# Patient Record
Sex: Female | Born: 1955 | Race: Black or African American | Hispanic: No | Marital: Single | Smoking: Former smoker
Health system: Southern US, Community
[De-identification: ages and names within clinical notes are randomized; demographics above are authoritative.]

## PROBLEM LIST (undated history)

## (undated) DIAGNOSIS — E559 Vitamin D deficiency, unspecified: Secondary | ICD-10-CM

## (undated) DIAGNOSIS — M1990 Unspecified osteoarthritis, unspecified site: Secondary | ICD-10-CM

## (undated) DIAGNOSIS — E785 Hyperlipidemia, unspecified: Secondary | ICD-10-CM

## (undated) HISTORY — PX: ABDOMINAL HYSTERECTOMY: SHX81

## (undated) HISTORY — DX: Hyperlipidemia, unspecified: E78.5

## (undated) HISTORY — DX: Unspecified osteoarthritis, unspecified site: M19.90

## (undated) HISTORY — DX: Vitamin D deficiency, unspecified: E55.9

## (undated) HISTORY — PX: COLONOSCOPY: SHX174

---

## 1992-11-23 HISTORY — PX: TUBAL LIGATION: SHX77

## 2001-05-24 ENCOUNTER — Other Ambulatory Visit: Admission: RE | Admit: 2001-05-24 | Discharge: 2001-05-24 | Payer: Self-pay | Admitting: Family Medicine

## 2003-05-24 ENCOUNTER — Other Ambulatory Visit: Admission: RE | Admit: 2003-05-24 | Discharge: 2003-05-24 | Payer: Self-pay | Admitting: Family Medicine

## 2004-12-19 ENCOUNTER — Ambulatory Visit (HOSPITAL_COMMUNITY): Admission: RE | Admit: 2004-12-19 | Discharge: 2004-12-19 | Payer: Self-pay | Admitting: Family Medicine

## 2006-04-22 ENCOUNTER — Encounter: Admission: RE | Admit: 2006-04-22 | Discharge: 2006-04-22 | Payer: Self-pay | Admitting: Family Medicine

## 2007-03-21 ENCOUNTER — Other Ambulatory Visit: Admission: RE | Admit: 2007-03-21 | Discharge: 2007-03-21 | Payer: Self-pay | Admitting: Internal Medicine

## 2007-08-11 ENCOUNTER — Encounter: Admission: RE | Admit: 2007-08-11 | Discharge: 2007-08-11 | Payer: Self-pay | Admitting: Family Medicine

## 2007-11-03 ENCOUNTER — Ambulatory Visit (HOSPITAL_BASED_OUTPATIENT_CLINIC_OR_DEPARTMENT_OTHER): Admission: RE | Admit: 2007-11-03 | Discharge: 2007-11-03 | Payer: Self-pay | Admitting: Orthopedic Surgery

## 2009-07-25 ENCOUNTER — Other Ambulatory Visit: Admission: RE | Admit: 2009-07-25 | Discharge: 2009-07-25 | Payer: Self-pay | Admitting: Family Medicine

## 2009-08-13 ENCOUNTER — Encounter: Admission: RE | Admit: 2009-08-13 | Discharge: 2009-08-13 | Payer: Self-pay | Admitting: Family Medicine

## 2010-08-15 ENCOUNTER — Other Ambulatory Visit: Admission: RE | Admit: 2010-08-15 | Discharge: 2010-08-15 | Payer: Self-pay | Admitting: Family Medicine

## 2010-09-09 ENCOUNTER — Encounter: Admission: RE | Admit: 2010-09-09 | Discharge: 2010-09-09 | Payer: Self-pay | Admitting: Family Medicine

## 2010-12-14 ENCOUNTER — Encounter: Payer: Self-pay | Admitting: Family Medicine

## 2011-04-07 NOTE — Op Note (Signed)
NAME:  Molly Hanson, Molly Hanson               ACCOUNT NO.:  [id number]   MEDICAL RECORD NO.:  [id number]          PATIENT TYPE:  AMB   LOCATION:  [facility]                         FACILITY:  [facility]   PHYSICIAN:  Deidre Ala, M.D.    DATE OF BIRTH:  [birth year]   DATE OF PROCEDURE:  11/03/2007  DATE OF DISCHARGE:                               OPERATIVE REPORT   PREOPERATIVE DIAGNOSIS:  1. Left carpal tunnel syndrome.  2. Left elbow, medial elbow epicondylitis.  3. Left elbow, lateral elbow epicondylitis.   POSTOPERATIVE DIAGNOSIS:  1. Left carpal tunnel syndrome.  2. Left elbow, medial elbow epicondylitis.  3. Left elbow, lateral elbow epicondylitis.   PROCEDURE:  1. Left carpal tunnel release.  2. Injection cortisone Marcaine mixture medial elbow epicondyle left.  3. Injection cortisone Marcaine mixture lateral elbow epicondyle left.   SURGEON:  1. Charlesetta Shanks, M.D.   ASSISTANT:  Phineas Semen, P.A.-C   ANESTHESIA:  General with LMA.   CULTURES:  None.   DRAINS:  None.   ESTIMATED BLOOD LOSS:  Minimal.   TOURNIQUET TIME:  20 minutes.   PATHOLOGIC FINDINGS AND HISTORY:  Molly Hanson is a 55 year old female who  presented to me through the courtesy of Tally Joe, M.D. of the [facility].  She had had chronic elbow pain as well as  pain upper arm.  She does a lot of typing.  The patient had had recent  positive nerve conduction studies at [facility] on October 10, 2007 for mild left carpal tunnel.  On exam she had consistent exam with  medial and lateral elbow epicondylitis, and a carpal tunnel positivity  with positive Phalen's test, etc.  She did not desire cortisone  injections or splinting on the hand and wrist.  She wanted to proceed  with surgical intervention, and elected to proceed with injections to  calm down the epicondylitis from her typing component, but wanted to  have them done in the operating room.   We injected 2 mL of 40 mg/mL and 3 mL  0.5% Marcaine plain in both the  medial and lateral elbow epicondyle, before the patient was fully  prepped with a Betadine swab over the medial and lateral elbow  epicondyles.  We then did a left carpal tunnel release with classic  technique with classic findings noted.   DESCRIPTION OF PROCEDURE:  With adequate anesthesia obtained using LMA  technique, 1 gram Ancef given IV prophylaxis, the patient was placed in  the supine position.  The left upper extremity was prepped from the  fingertips to the tourniquet in the standard fashion.  After standard  prepping and draping both the medial and lateral elbow epicondyles were  injected as above.  We then prepped and draped distally, Esmarch  exsanguination was used, and the tourniquet was let up to 250 mmHg.  An  incision was then made in the longitudinal flexion crease at the base of  the palm and thumb.  Incision was deepened sharply with the knife and  hemostasis obtained using the Bovie electrocoagulated.  Under loupe  magnification, dissection was  carried down to the palmar fascia and  retractors were placed.   I then placed a Therapist, nutritional underneath the palmar fascia and  transverse carpal ligament, protecting the nerve.  I then cut down upon  it with a 64 Beaver blade exposing the nerve.  The nerve was then  carefully neurolysed with scissors, and a volar aponeurotomy carried  out.  I then released the distal wrist retinaculum on the ulnar side of  the forearm well up underneath the skin, up into the distal forearm.  Part of the radial retinaculum was then excised.  Bleeding points were  cauterized, irrigation was carried out.  The wound was then closed with  interrupted and vertical mattress sutures of running 4-0 nylon.  A bulky  sterile compressive dressing was applied with volar plaster splint in  slight cock-up.   The patient, having tolerated the procedure well, was awakened, taken to  recovery room in satisfactory  condition, to be discharged per outpatient  routine. given Percocet for pain; and told to call the office for  recheck on Monday.           ______________________________  V. Charlesetta Shanks, M.D.     VEP/MEDQ  D:  11/03/2007  T:  11/04/2007  Job:  [id number]   cc:   Tally Joe, M.D.  Fax: [phone]

## 2011-08-31 LAB — I-STAT 8, (EC8 V) (CONVERTED LAB)
Acid-Base Excess: 1
Chloride: 107
Glucose, Bld: 92
TCO2: 26
pCO2, Ven: 37.4 — ABNORMAL LOW
pH, Ven: 7.439 — ABNORMAL HIGH

## 2011-08-31 LAB — POCT PREGNANCY, URINE
Operator id: 268271
Preg Test, Ur: NEGATIVE

## 2016-06-02 DIAGNOSIS — M6701 Short Achilles tendon (acquired), right ankle: Secondary | ICD-10-CM | POA: Insufficient documentation

## 2016-06-02 DIAGNOSIS — M7751 Other enthesopathy of right foot: Secondary | ICD-10-CM | POA: Insufficient documentation

## 2016-06-02 DIAGNOSIS — M7661 Achilles tendinitis, right leg: Secondary | ICD-10-CM | POA: Insufficient documentation

## 2020-07-23 ENCOUNTER — Other Ambulatory Visit: Payer: Self-pay | Admitting: Family

## 2020-07-23 DIAGNOSIS — Z1231 Encounter for screening mammogram for malignant neoplasm of breast: Secondary | ICD-10-CM

## 2020-09-10 ENCOUNTER — Other Ambulatory Visit: Payer: Self-pay

## 2020-09-10 ENCOUNTER — Ambulatory Visit
Admission: RE | Admit: 2020-09-10 | Discharge: 2020-09-10 | Disposition: A | Discharge status: Home / Self Care | Payer: Self-pay | Source: Ambulatory Visit | Attending: Family | Admitting: Family

## 2020-09-10 DIAGNOSIS — Z1231 Encounter for screening mammogram for malignant neoplasm of breast: Secondary | ICD-10-CM

## 2020-09-18 ENCOUNTER — Other Ambulatory Visit: Payer: Self-pay | Admitting: Specialist

## 2020-09-18 ENCOUNTER — Other Ambulatory Visit: Payer: Self-pay | Admitting: Urgent Care

## 2020-09-18 DIAGNOSIS — R1031 Right lower quadrant pain: Secondary | ICD-10-CM

## 2020-09-30 ENCOUNTER — Encounter: Payer: Self-pay | Admitting: Internal Medicine

## 2020-10-01 NOTE — Patient Instructions (Signed)
Thank you for choosing Primary Care at [facility] to be your medical home!    Dallas Schimke was seen by Melina Schools, DO today.   Gaynelle Arabian Rupert's primary care provider is Phill Myron, DO.   For the best care possible, you should try to see Phill Myron, DO whenever you come to the clinic.   We look forward to seeing you again soon!  If you have any questions about your visit today, please call us at [phone] or feel free to reach your primary care provider via [place].

## 2020-10-02 ENCOUNTER — Encounter: Payer: Self-pay | Admitting: Internal Medicine

## 2020-10-02 ENCOUNTER — Telehealth (INDEPENDENT_AMBULATORY_CARE_PROVIDER_SITE_OTHER): Payer: 59 | Admitting: Internal Medicine

## 2020-10-02 DIAGNOSIS — Z1211 Encounter for screening for malignant neoplasm of colon: Secondary | ICD-10-CM | POA: Diagnosis not present

## 2020-10-02 DIAGNOSIS — R200 Anesthesia of skin: Secondary | ICD-10-CM | POA: Diagnosis not present

## 2020-10-02 DIAGNOSIS — Z7689 Persons encountering health services in other specified circumstances: Secondary | ICD-10-CM

## 2020-10-02 NOTE — Progress Notes (Addendum)
Virtual Visit via Telephone Note  I connected with Molly Hanson, on 10/02/2020 at 8:56 AM by telephone due to the COVID-19 pandemic and verified that I am speaking with the correct person using two identifiers.   Consent: I discussed the limitations, risks, security and privacy concerns of performing an evaluation and management service by telephone and the availability of in person appointments. I also discussed with the patient that there may be a patient responsible charge related to this service. The patient expressed understanding and agreed to proceed.   Location of Patient: Home   Location of Provider: Clinic    Persons participating in Telemedicine visit: Menaal Russum [facility] Dr. Juleen China    History of Present Illness: Patient has a visit to establish care. Reports she used to have HTN but this has resolved. History of BTL 28 years ago. Family history of colon polyps. Reports her last colonoscopy was >10 years and was normal.   Reports that she is having is left sided shoulder and knee pain. Also has left sided numbness that goes from her face down to her toes. No facial droop. No change in speech pattern. No weakness on the left side. Able to move all extremities equally. This all started about 2 weeks ago. The pain comes and goes but the numbness stays present. Reports numbness feels like a weird sensation when she rubs her skin. Doesn't remember exactly what was going on when this started.    No past medical history on file. No Known Allergies  Current Outpatient Medications on File Prior to Visit  Medication Sig Dispense Refill  . Vitamin D, Ergocalciferol, (DRISDOL) 1.25 MG (50000 UNIT) CAPS capsule Take 50,000 Units by mouth once a week.     No current facility-administered medications on file prior to visit.    Observations/Objective: NAD. Speaking clearly.  Work of breathing normal.  Alert and oriented. Mood appropriate.   Assessment and  Plan: 1. Encounter to establish care Reviewed patient's PMH, social history, surgical history, and medications.  Is overdue for annual exam, screening blood work, and health maintenance topics. Have asked patient to return for visit to address these items.   2. Screening for colon cancer - Ambulatory referral to Gastroenterology  3. Left sided numbness Differential remains broad. Given persistent one sided numbness, concerned from CVA or mass. Given symptoms have been persistent for >2 weeks, she is outside of window for acute intervention if were to be CVA and did not recommend emergent medical attention. Will obtain MRI brain. Will also obtain labs to rule out other etiologies of neuropathies. If work up is unremarkable, would likely refer to neuro for work up and consideration of utility of EMG studies. Considered that given her report of shoulder and knee pain that could be nerve compression; however per patient report her numbness is diffuse and involves the face making this less likely.  - MR Brain W Wo Contrast; Future - TSH; Future - Comprehensive metabolic panel; Future - CBC; Future - Vitamin B12; Future - Folate; Future - RPR; Future - HIV Antibody (routine testing w rflx); Future - Hemoglobin A1c; Future   Follow Up Instructions: Lab visit 11/12    I discussed the assessment and treatment plan with the patient. The patient was provided an opportunity to ask questions and all were answered. The patient agreed with the plan and demonstrated an understanding of the instructions.   The patient was advised to call back or seek an in-person evaluation if the symptoms  worsen or if the condition fails to improve as anticipated.     I provided 24 minutes total of non-face-to-face time during this encounter including median intraservice time, reviewing previous notes, investigations, ordering medications, medical decision making, coordinating care and patient verbalized understanding  at the end of the visit.    Phill Myron, D.O. Primary Care at [facility]  10/02/2020, 8:56 AM

## 2020-10-03 ENCOUNTER — Inpatient Hospital Stay: Admission: RE | Admit: 2020-10-03 | Payer: 59 | Source: Ambulatory Visit

## 2020-10-04 ENCOUNTER — Telehealth: Payer: Self-pay | Admitting: Internal Medicine

## 2020-10-04 ENCOUNTER — Other Ambulatory Visit: Payer: Self-pay

## 2020-10-04 ENCOUNTER — Other Ambulatory Visit (INDEPENDENT_AMBULATORY_CARE_PROVIDER_SITE_OTHER): Payer: 59

## 2020-10-04 DIAGNOSIS — R200 Anesthesia of skin: Secondary | ICD-10-CM

## 2020-10-04 NOTE — Telephone Encounter (Signed)
We do not do prior authorizations for orders placed by a different physician. She will need to contact the ordering physicians office to have them do the PA.

## 2020-10-04 NOTE — Telephone Encounter (Signed)
Pt was seen at [facility] October 2021 & provider there ordered CT scan. Pt states now that she has established care with Juleen China as her PCP will Juleen China order a CT scan for her abdominal pain. Please advise if pt has seen PCP for abdominal pain or if pt needs appt with PCP to discuss abdominal pain.

## 2020-10-04 NOTE — Telephone Encounter (Signed)
Pt request PCP order CT abdomen with and without contrast for abdominal pain. Pt states she has the contrast in the fridge at home. Pt states was ordered by another physician outside of [facility] but no auth obtained so pt req PCP order.

## 2020-10-05 LAB — RPR: RPR Ser Ql: NONREACTIVE

## 2020-10-05 LAB — CBC
Hematocrit: 44.8 % (ref 34.0–46.6)
Hemoglobin: 15 g/dL (ref 11.1–15.9)
MCH: 29.5 pg (ref 26.6–33.0)
MCHC: 33.5 g/dL (ref 31.5–35.7)
MCV: 88 fL (ref 79–97)
Platelets: 272 10*3/uL (ref 150–450)
RBC: 5.09 x10E6/uL (ref 3.77–5.28)
RDW: 12.8 % (ref 11.7–15.4)
WBC: 8.9 10*3/uL (ref 3.4–10.8)

## 2020-10-05 LAB — COMPREHENSIVE METABOLIC PANEL
ALT: 22 IU/L (ref 0–32)
AST: 15 IU/L (ref 0–40)
Albumin/Globulin Ratio: 1.6 (ref 1.2–2.2)
Albumin: 4.2 g/dL (ref 3.8–4.8)
Alkaline Phosphatase: 78 IU/L (ref 44–121)
BUN/Creatinine Ratio: 10 — ABNORMAL LOW (ref 12–28)
BUN: 9 mg/dL (ref 8–27)
Bilirubin Total: 0.5 mg/dL (ref 0.0–1.2)
CO2: 23 mmol/L (ref 20–29)
Calcium: 9.3 mg/dL (ref 8.7–10.3)
Chloride: 106 mmol/L (ref 96–106)
Creatinine, Ser: 0.86 mg/dL (ref 0.57–1.00)
GFR calc Af Amer: 84 mL/min/{1.73_m2} (ref >59)
GFR calc non Af Amer: 73 mL/min/{1.73_m2} (ref >59)
Globulin, Total: 2.6 g/dL (ref 1.5–4.5)
Glucose: 103 mg/dL — ABNORMAL HIGH (ref 65–99)
Potassium: 4.2 mmol/L (ref 3.5–5.2)
Sodium: 141 mmol/L (ref 134–144)
Total Protein: 6.8 g/dL (ref 6.0–8.5)

## 2020-10-05 LAB — VITAMIN B12: Vitamin B-12: 689 pg/mL (ref 232–1245)

## 2020-10-05 LAB — TSH: TSH: 1.65 u[IU]/mL (ref 0.450–4.500)

## 2020-10-05 LAB — HEMOGLOBIN A1C
Est. average glucose Bld gHb Est-mCnc: 128 mg/dL
Hgb A1c MFr Bld: 6.1 % — ABNORMAL HIGH (ref 4.8–5.6)

## 2020-10-05 LAB — FOLATE: Folate: 7.8 ng/mL (ref >3.0)

## 2020-10-05 LAB — HIV ANTIBODY (ROUTINE TESTING W REFLEX): HIV Screen 4th Generation wRfx: NONREACTIVE

## 2020-10-07 ENCOUNTER — Other Ambulatory Visit: Payer: Self-pay | Admitting: Urgent Care

## 2020-10-07 DIAGNOSIS — R1031 Right lower quadrant pain: Secondary | ICD-10-CM

## 2020-10-07 NOTE — Telephone Encounter (Signed)
Called and lvm for the pt

## 2020-10-07 NOTE — Telephone Encounter (Signed)
Again, patient will need to call [facility] to have them do PA for the CT scan. I will work on the [place] for the MRI.

## 2020-10-10 ENCOUNTER — Other Ambulatory Visit: Payer: 59

## 2020-10-15 ENCOUNTER — Other Ambulatory Visit: Payer: Self-pay | Admitting: Urgent Care

## 2020-10-15 DIAGNOSIS — R1031 Right lower quadrant pain: Secondary | ICD-10-CM

## 2020-10-15 NOTE — Progress Notes (Signed)
Normal lab letter mailed.

## 2020-10-20 ENCOUNTER — Ambulatory Visit
Admission: RE | Admit: 2020-10-20 | Discharge: 2020-10-20 | Disposition: A | Discharge status: Home / Self Care | Payer: 59 | Source: Ambulatory Visit | Attending: Internal Medicine | Admitting: Internal Medicine

## 2020-10-20 ENCOUNTER — Other Ambulatory Visit: Payer: Self-pay

## 2020-10-20 DIAGNOSIS — R200 Anesthesia of skin: Secondary | ICD-10-CM

## 2020-10-20 MED ORDER — GADOBENATE DIMEGLUMINE 529 MG/ML IV SOLN
20.0000 mL | Freq: Once | INTRAVENOUS | Status: AC | PRN
  Administered 2020-10-20: 20 mL via INTRAVENOUS

## 2020-10-22 ENCOUNTER — Ambulatory Visit
Admission: RE | Admit: 2020-10-22 | Discharge: 2020-10-22 | Disposition: A | Discharge status: Home / Self Care | Payer: 59 | Source: Ambulatory Visit | Attending: Urgent Care | Admitting: Urgent Care

## 2020-10-22 DIAGNOSIS — R1031 Right lower quadrant pain: Secondary | ICD-10-CM

## 2020-10-22 MED ORDER — IOPAMIDOL (ISOVUE-300) INJECTION 61%
100.0000 mL | Freq: Once | INTRAVENOUS | Status: AC | PRN
  Administered 2020-10-22: 100 mL via INTRAVENOUS

## 2020-10-25 NOTE — Progress Notes (Signed)
Normal lab letter mailed.

## 2020-10-28 NOTE — Progress Notes (Signed)
Patient notified of results & recommendations. Expressed understanding.  Wants to know what the next steps would be.

## 2020-10-31 ENCOUNTER — Other Ambulatory Visit: Payer: Self-pay | Admitting: Internal Medicine

## 2020-10-31 DIAGNOSIS — R200 Anesthesia of skin: Secondary | ICD-10-CM

## 2020-11-01 ENCOUNTER — Encounter: Payer: Self-pay | Admitting: Neurology

## 2020-11-20 ENCOUNTER — Other Ambulatory Visit: Payer: Self-pay

## 2020-11-20 ENCOUNTER — Ambulatory Visit (AMBULATORY_SURGERY_CENTER): Payer: Self-pay | Admitting: *Deleted

## 2020-11-20 VITALS — Ht 60.5 in | Wt 253.0 lb

## 2020-11-20 DIAGNOSIS — Z1211 Encounter for screening for malignant neoplasm of colon: Secondary | ICD-10-CM

## 2020-11-20 MED ORDER — SUPREP BOWEL PREP KIT 17.5-3.13-1.6 GM/177ML PO SOLN: 1.0000 | Freq: Once | ORAL | 0 refills | Status: AC

## 2020-11-20 NOTE — Progress Notes (Signed)

## 2020-12-04 ENCOUNTER — Encounter: Payer: Self-pay | Admitting: Gastroenterology

## 2020-12-04 ENCOUNTER — Other Ambulatory Visit: Payer: Self-pay

## 2020-12-04 ENCOUNTER — Other Ambulatory Visit: Payer: Self-pay | Admitting: Gastroenterology

## 2020-12-04 ENCOUNTER — Ambulatory Visit (AMBULATORY_SURGERY_CENTER): Payer: 59 | Admitting: Gastroenterology

## 2020-12-04 VITALS — BP 114/79 | HR 75 | Temp 97.7°F | Resp 16 | Ht 65.0 in | Wt 253.0 lb

## 2020-12-04 DIAGNOSIS — D122 Benign neoplasm of ascending colon: Secondary | ICD-10-CM | POA: Diagnosis not present

## 2020-12-04 DIAGNOSIS — Z1211 Encounter for screening for malignant neoplasm of colon: Secondary | ICD-10-CM | POA: Diagnosis present

## 2020-12-04 DIAGNOSIS — D125 Benign neoplasm of sigmoid colon: Secondary | ICD-10-CM

## 2020-12-04 DIAGNOSIS — D123 Benign neoplasm of transverse colon: Secondary | ICD-10-CM

## 2020-12-04 MED ORDER — SODIUM CHLORIDE 0.9 % IV SOLN: 500.0000 mL | INTRAVENOUS | Status: DC

## 2020-12-04 NOTE — Progress Notes (Signed)
VS-SH  Pt's states no medical or surgical changes since previsit or office visit.  

## 2020-12-04 NOTE — Progress Notes (Signed)
Report given to PACU, vss 

## 2020-12-04 NOTE — Progress Notes (Signed)
Called to room to assist during endoscopic procedure.  Patient ID and intended procedure confirmed with present staff. Received instructions for my participation in the procedure from the performing physician.  

## 2020-12-04 NOTE — Op Note (Signed)
[facility] [place] Patient Name: Molly Hanson Procedure Date: 12/04/2020 9:04 AM MRN: [id number] Endoscopist: Remo Lipps P. Havery [name] , MD Age: 65 Referring MD:  Date of Birth: 1956-11-19 Gender: Female Account #: [id number] Procedure:                Colonoscopy Indications:              Screening for colorectal malignant neoplasm Medicines:                Monitored Anesthesia Care Procedure:                Pre-Anesthesia Assessment:                           - Prior to the procedure, a History and Physical                            was performed, and patient medications and                            allergies were reviewed. The patient's tolerance of                            previous anesthesia was also reviewed. The risks                            and benefits of the procedure and the sedation                            options and risks were discussed with the patient.                            All questions were answered, and informed consent                            was obtained. Prior Anticoagulants: The patient has                            taken no previous anticoagulant or antiplatelet                            agents. ASA Grade Assessment: I - A normal, healthy                            patient. After reviewing the risks and benefits,                            the patient was deemed in satisfactory condition to                            undergo the procedure.                           After obtaining informed consent, the colonoscope  was passed under direct vision. Throughout the                            procedure, the patient's blood pressure, pulse, and                            oxygen saturations were monitored continuously. The                            Olympus PCF-H190DL [id number]) Colonoscope was                            introduced through the anus and advanced to the the                            cecum, identified by  appendiceal orifice and                            ileocecal valve. The colonoscopy was performed                            without difficulty. The patient tolerated the                            procedure well. The quality of the bowel                            preparation was good. The ileocecal valve,                            appendiceal orifice, and rectum were photographed. Scope In: 9:13:14 AM Scope Out: 9:31:14 AM Scope Withdrawal Time: 0 hours 14 minutes 37 seconds  Total Procedure Duration: 0 hours 18 minutes 0 seconds  Findings:                 The perianal and digital rectal examinations were                            normal.                           Scattered small-mouthed diverticula were found in                            the transverse colon and left colon.                           A 3 mm polyp was found in the ascending colon. The                            polyp was sessile. The polyp was removed with a                            cold snare. Resection and retrieval were complete.  A 4 mm polyp was found in the transverse colon. The                            polyp was sessile. The polyp was removed with a                            cold snare. Resection and retrieval were complete.                           A 3 mm polyp was found in the sigmoid colon. The                            polyp was sessile. The polyp was removed with a                            cold snare. Resection and retrieval were complete.                           Internal hemorrhoids were found during                            retroflexion. The hemorrhoids were small.                           The exam was otherwise without abnormality. Complications:            No immediate complications. Estimated blood loss:                            Minimal. Estimated Blood Loss:     Estimated blood loss was minimal. Impression:               - Diverticulosis in the transverse  colon and in the                            left colon.                           - One 3 mm polyp in the ascending colon, removed                            with a cold snare. Resected and retrieved.                           - One 4 mm polyp in the transverse colon, removed                            with a cold snare. Resected and retrieved.                           - One 3 mm polyp in the sigmoid colon, removed with                            a  cold snare. Resected and retrieved.                           - Internal hemorrhoids.                           - The examination was otherwise normal. Recommendation:           - Patient has a contact number available for                            emergencies. The signs and symptoms of potential                            delayed complications were discussed with the                            patient. Return to normal activities tomorrow.                            Written discharge instructions were provided to the                            patient.                           - Resume previous diet.                           - Continue present medications.                           - Await pathology results. Remo Lipps P. Jmari Pelc, MD 12/04/2020 9:35:22 AM This report has been signed electronically.

## 2020-12-04 NOTE — Patient Instructions (Signed)
Thank you for allowing [place] to care for you today!  Await biopsy results of polyps , approximately 7-10 days by mail.  Resume previous diet and medications today.  Return to your normal activities tomorrow.  Handouts for polyps and diverticulosis given.  YOU HAD AN ENDOSCOPIC PROCEDURE TODAY AT [facility]:   Refer to the procedure report that was given to you for any specific questions about what was found during the examination.  If the procedure report does not answer your questions, please call your gastroenterologist to clarify.  If you requested that your care partner not be given the details of your procedure findings, then the procedure report has been included in a sealed envelope for you to review at your convenience later.  YOU SHOULD EXPECT: Some feelings of bloating in the abdomen. Passage of more gas than usual.  Walking can help get rid of the air that was put into your GI tract during the procedure and reduce the bloating. If you had a lower endoscopy (such as a colonoscopy or flexible sigmoidoscopy) you may notice spotting of blood in your stool or on the toilet paper. If you underwent a bowel prep for your procedure, you may not have a normal bowel movement for a few days.  Please Note:  You might notice some irritation and congestion in your nose or some drainage.  This is from the oxygen used during your procedure.  There is no need for concern and it should clear up in a day or so.  SYMPTOMS TO REPORT IMMEDIATELY:   Following lower endoscopy (colonoscopy or flexible sigmoidoscopy):  Excessive amounts of blood in the stool  Significant tenderness or worsening of abdominal pains  Swelling of the abdomen that is new, acute  Fever of 100F or higher   For urgent or emergent issues, a gastroenterologist can be reached at any hour by calling [phone]. Do not use MyChart messaging for urgent concerns.    DIET:  We do recommend a small meal at first,  but then you may proceed to your regular diet.  Drink plenty of fluids but you should avoid alcoholic beverages for 24 hours.  ACTIVITY:  You should plan to take it easy for the rest of today and you should NOT DRIVE or use heavy machinery until tomorrow (because of the sedation medicines used during the test).    FOLLOW UP: Our staff will call the number listed on your records 48-72 hours following your procedure to check on you and address any questions or concerns that you may have regarding the information given to you following your procedure. If we do not reach you, we will leave a message.  We will attempt to reach you two times.  During this call, we will ask if you have developed any symptoms of COVID 19. If you develop any symptoms (ie: fever, flu-like symptoms, shortness of breath, cough etc.) before then, please call [phone].  If you test positive for Covid 19 in the 2 weeks post procedure, please call and report this information to [place].    If any biopsies were taken you will be contacted by phone or by letter within the next 1-3 weeks.  Please call us at [phone] if you have not heard about the biopsies in 3 weeks.    SIGNATURES/CONFIDENTIALITY: You and/or your care partner have signed paperwork which will be entered into your electronic medical record.  These signatures attest to the fact that that the information above on  your After Visit Summary has been reviewed and is understood.  Full responsibility of the confidentiality of this discharge information lies with you and/or your care-partner.YOU HAD AN ENDOSCOPIC PROCEDURE TODAY AT [facility]:   Refer to the procedure report that was given to you for any specific questions about what was found during the examination.  If the procedure report does not answer your questions, please call your gastroenterologist to clarify.  If you requested that your care partner not be given the details of your procedure  findings, then the procedure report has been included in a sealed envelope for you to review at your convenience later.  YOU SHOULD EXPECT: Some feelings of bloating in the abdomen. Passage of more gas than usual.  Walking can help get rid of the air that was put into your GI tract during the procedure and reduce the bloating. If you had a lower endoscopy (such as a colonoscopy or flexible sigmoidoscopy) you may notice spotting of blood in your stool or on the toilet paper. If you underwent a bowel prep for your procedure, you may not have a normal bowel movement for a few days.  Please Note:  You might notice some irritation and congestion in your nose or some drainage.  This is from the oxygen used during your procedure.  There is no need for concern and it should clear up in a day or so.  SYMPTOMS TO REPORT IMMEDIATELY:   Following lower endoscopy (colonoscopy or flexible sigmoidoscopy):  Excessive amounts of blood in the stool  Significant tenderness or worsening of abdominal pains  Swelling of the abdomen that is new, acute  Fever of 100F or higher   For urgent or emergent issues, a gastroenterologist can be reached at any hour by calling [phone]. Do not use MyChart messaging for urgent concerns.    DIET:  We do recommend a small meal at first, but then you may proceed to your regular diet.  Drink plenty of fluids but you should avoid alcoholic beverages for 24 hours.  ACTIVITY:  You should plan to take it easy for the rest of today and you should NOT DRIVE or use heavy machinery until tomorrow (because of the sedation medicines used during the test).    FOLLOW UP: Our staff will call the number listed on your records 48-72 hours following your procedure to check on you and address any questions or concerns that you may have regarding the information given to you following your procedure. If we do not reach you, we will leave a message.  We will attempt to reach you two times.   During this call, we will ask if you have developed any symptoms of COVID 19. If you develop any symptoms (ie: fever, flu-like symptoms, shortness of breath, cough etc.) before then, please call [phone].  If you test positive for Covid 19 in the 2 weeks post procedure, please call and report this information to [place].    If any biopsies were taken you will be contacted by phone or by letter within the next 1-3 weeks.  Please call us at [phone] if you have not heard about the biopsies in 3 weeks.    SIGNATURES/CONFIDENTIALITY: You and/or your care partner have signed paperwork which will be entered into your electronic medical record.  These signatures attest to the fact that that the information above on your After Visit Summary has been reviewed and is understood.  Full responsibility of the confidentiality of this  discharge information lies with you and/or your care-partner. 

## 2020-12-06 ENCOUNTER — Telehealth: Payer: Self-pay

## 2020-12-06 NOTE — Telephone Encounter (Signed)
Left message on answering machine. 

## 2020-12-06 NOTE — Telephone Encounter (Signed)
  Follow up Call-  Call back number 12/04/2020  Post procedure Call Back phone  # [phone]  Permission to leave phone message Yes  Some recent data might be hidden     Patient questions:  Do you have a fever, pain , or abdominal swelling? No. Pain Score  0 *  Have you tolerated food without any problems? Yes.    Have you been able to return to your normal activities? Yes.    Do you have any questions about your discharge instructions: Diet   No. Medications  No. Follow up visit  No.  Do you have questions or concerns about your Care? No.  Actions: * If pain score is 4 or above: No action needed, pain <4. 1. Have you developed a fever since your procedure? no  2.   Have you had an respiratory symptoms (SOB or cough) since your procedure? no  3.   Have you tested positive for COVID 19 since your procedure no  4.   Have you had any family members/close contacts diagnosed with the COVID 19 since your procedure?  no   If yes to any of these questions please route to Joylene John, RN and Joella Prince, RN

## 2020-12-12 ENCOUNTER — Encounter: Payer: Self-pay | Admitting: Gastroenterology

## 2020-12-20 ENCOUNTER — Telehealth: Payer: Self-pay | Admitting: Internal Medicine

## 2020-12-20 NOTE — Telephone Encounter (Signed)
Her CT abdomen from Nov 2021 was normal with no explanation of her abdominal pain.   Phill Myron, D.O. Primary Care at [facility]  12/20/2020, 9:47 AM

## 2020-12-20 NOTE — Telephone Encounter (Signed)
Still abdomen pain and would like a call regarding the CT results.

## 2020-12-27 ENCOUNTER — Telehealth: Payer: Self-pay | Admitting: Internal Medicine

## 2020-12-27 NOTE — Telephone Encounter (Signed)
Pt is inquiring for an Ultrasound order from PCP to find out why she's having abdominal pain. Please advise and thank you

## 2020-12-31 NOTE — Telephone Encounter (Signed)
I have never seen patient for this complaint, she just went to ER. Will need in person visit with me or someone else in the office to further assess and determine other steps.   Phill Myron, D.O. Primary Care at [facility]  12/31/2020, 11:41 AM

## 2021-01-30 ENCOUNTER — Ambulatory Visit: Payer: 59 | Admitting: Neurology

## 2021-08-07 ENCOUNTER — Other Ambulatory Visit: Payer: Self-pay | Admitting: Internal Medicine

## 2021-08-07 ENCOUNTER — Other Ambulatory Visit: Payer: Self-pay

## 2021-08-07 DIAGNOSIS — Z1231 Encounter for screening mammogram for malignant neoplasm of breast: Secondary | ICD-10-CM

## 2021-09-11 ENCOUNTER — Other Ambulatory Visit: Payer: Self-pay

## 2021-09-11 ENCOUNTER — Ambulatory Visit
Admission: RE | Admit: 2021-09-11 | Discharge: 2021-09-11 | Disposition: A | Discharge status: Home / Self Care | Payer: 59 | Source: Ambulatory Visit | Attending: Internal Medicine | Admitting: Internal Medicine

## 2021-09-11 DIAGNOSIS — Z1231 Encounter for screening mammogram for malignant neoplasm of breast: Secondary | ICD-10-CM

## 2021-09-17 ENCOUNTER — Telehealth (INDEPENDENT_AMBULATORY_CARE_PROVIDER_SITE_OTHER): Payer: 59 | Admitting: Nurse Practitioner

## 2021-09-17 ENCOUNTER — Other Ambulatory Visit: Payer: Self-pay

## 2021-09-17 ENCOUNTER — Encounter: Payer: Self-pay | Admitting: Nurse Practitioner

## 2021-09-17 DIAGNOSIS — G8929 Other chronic pain: Secondary | ICD-10-CM

## 2021-09-17 DIAGNOSIS — M25562 Pain in left knee: Secondary | ICD-10-CM

## 2021-09-17 NOTE — Progress Notes (Signed)
Virtual Visit via Telephone Note  I connected with Molly Hanson on 09/17/21 at  1:20 PM EDT by telephone and verified that I am speaking with the correct person using two identifiers.  Location: Patient: home Provider: office   I discussed the limitations, risks, security and privacy concerns of performing an evaluation and management service by telephone and the availability of in person appointments. I also discussed with the patient that there may be a patient responsible charge related to this service. The patient expressed understanding and agreed to proceed.   History of Present Illness:  Patient presents today for telephone visit for knee pain.  She states that she does have chronic left knee pain.  She states that she has been having this pain for over 10 years now.  She did have orthoscopic surgery to that knee over 10 years ago.  She states that the pain is worsening.  She states that when she walks it feels like bone grinding.  Patient has not seen orthopedics in several years.  She has not had any recent imaging.  We will order x-ray for her today.  Place a referral back to Ortho for her today. Denies f/c/s, n/v/d, hemoptysis, PND, chest pain or edema.    Observations/Objective:  Vitals with BMI 12/04/2020 12/04/2020 12/04/2020  Height - - -  Weight - - -  BMI - - -  Systolic [phone]  Diastolic 79 71 64  Pulse 75 81 86      Assessment and Plan:  Left knee pain:  Will order x-ray  Will place referral to Ortho  Rest leg and avoid weightbearing if possible  Elevate left leg when possible.  May try ice packs  Follow up:  Follow up if needed    I discussed the assessment and treatment plan with the patient. The patient was provided an opportunity to ask questions and all were answered. The patient agreed with the plan and demonstrated an understanding of the instructions.   The patient was advised to call back or seek an in-person evaluation if the  symptoms worsen or if the condition fails to improve as anticipated.  I provided 23 minutes of non-face-to-face time during this encounter.   Fenton Foy, NP

## 2021-09-17 NOTE — Patient Instructions (Addendum)
Left knee pain:  Will order x-ray  Will place referral to Ortho  Rest leg and avoid weightbearing if possible  Elevate left leg when possible.  May try ice packs  Follow up:  Follow up if needed

## 2021-09-18 ENCOUNTER — Ambulatory Visit
Admission: RE | Admit: 2021-09-18 | Discharge: 2021-09-18 | Disposition: A | Discharge status: Home / Self Care | Payer: 59 | Source: Ambulatory Visit | Attending: Nurse Practitioner | Admitting: Nurse Practitioner

## 2021-09-24 ENCOUNTER — Ambulatory Visit (INDEPENDENT_AMBULATORY_CARE_PROVIDER_SITE_OTHER): Payer: 59 | Admitting: Physician Assistant

## 2021-09-24 ENCOUNTER — Ambulatory Visit (INDEPENDENT_AMBULATORY_CARE_PROVIDER_SITE_OTHER): Payer: 59

## 2021-09-24 ENCOUNTER — Encounter: Payer: Self-pay | Admitting: Physician Assistant

## 2021-09-24 DIAGNOSIS — M1712 Unilateral primary osteoarthritis, left knee: Secondary | ICD-10-CM | POA: Diagnosis not present

## 2021-09-24 DIAGNOSIS — M9262 Juvenile osteochondrosis of tarsus, left ankle: Secondary | ICD-10-CM

## 2021-09-24 MED ORDER — LIDOCAINE HCL 1 % IJ SOLN
3.0000 mL | INTRAMUSCULAR | Status: AC | PRN
  Administered 2021-09-24: 3 mL

## 2021-09-24 MED ORDER — METHYLPREDNISOLONE ACETATE 40 MG/ML IJ SUSP
40.0000 mg | INTRAMUSCULAR | Status: AC | PRN
  Administered 2021-09-24: 40 mg via INTRA_ARTICULAR

## 2021-09-24 NOTE — Progress Notes (Signed)
Office Visit Note   Patient: Molly Hanson           Date of Birth: [birth year]           MRN: [id number] Visit Date: 09/24/2021              Requested by: Fenton Foy, NP [place],  [place] [address] PCP: Nicolette Bang, MD   Assessment & Plan: Visit Diagnoses:  1. Haglund's deformity, left   2. Primary osteoarthritis of left knee     Plan: She will work on quad strengthening exercises for the left knee.  In regards to the Haglund's deformity discussed shoe wear with her.  She will continue to wear the lift in her left shoe.  She will avoid open back shoes.  Stretching exercises discussed.  Voltaren gel 4 g up to 4 times daily to the Haglund's deformity region.  Questions were encouraged and answered.  Symptomatic for 2 weeks see what type of response she had to the cortisone injection left knee.  Follow-Up Instructions: Return in about 2 weeks (around 10/08/2021).   Orders:  Orders Placed This Encounter  Procedures   XR Os Calcis Left   No orders of the defined types were placed in this encounter.     Procedures: Large Joint Inj: L knee on 09/24/2021 4:56 PM Indications: pain Details: anterolateral approach Medications: 3 mL lidocaine 1 %; 40 mg methylPREDNISolone acetate 40 MG/ML Consent was given by the patient. Immediately prior to procedure a time out was called to verify the correct patient, procedure, equipment, support staff and site/side marked as required. Patient was prepped and draped in the usual sterile fashion.      Clinical Data: No additional findings.   Subjective: Chief Complaint  Patient presents with   Left Knee - Pain    HPI Molly Hanson is a 65 year old female comes in today for left heel pain and left knee pain.  She states she has had left heel pain for couple years.  She has tried some stretching on her own.  She has most pain early in the morning whenever she rises from sleeping.  She has had no known injury.   She is had no real treatment.  She is also having left knee pain that is been ongoing for years no known injury.  She does report a knee arthroscopy some 20 years ago.  She states it feels like there is bone touching bone in her knee.  She has no mechanical symptoms of the knee.  She is nondiabetic. Radiographs of her left knee are supine films 4 views show bone-on-bone lateral compartment and moderate medial compartmental narrowing.  Moderately severe patellofemoral changes.  Knee is well located.  Review of Systems Denies any fevers, chills recent vaccines.  Objective: Vital Signs: There were no vitals taken for this visit.  Physical Exam Constitutional:      Appearance: She is not ill-appearing or diaphoretic.  Neurological:     Mental Status: She is alert and oriented to person, place, and time.  Psychiatric:        Mood and Affect: Mood normal.    Ortho Exam Bilateral knees she has slight hyperextension of both knees.  Left knee tenderness along the lateral joint line.  No laxity valgus varus stressing of either knee.  No abnormal warmth erythema or effusion about her knee.  Left knee with significant patellofemoral crepitus with passive range of motion.  Left heel she has prominence consistent  with Haglund's deformity.  Thompson test negative.  There is no impending ulcers or skin breakdown.  Specialty Comments:  No specialty comments available.  Imaging: XR Os Calcis Left  Result Date: 09/24/2021 2 views of the left heel: No acute fractures.  Large Haglund's deformities present.    PMFS History: There are no problems to display for this patient.  Past Medical History:  Diagnosis Date   Arthritis    knees, arm    Hyperlipidemia    has been elevated in the past - no meds    Vitamin D deficiency     Family History  Problem Relation Age of Onset   Breast cancer Mother    Diabetes Mother    Diabetes Father    Diabetes Maternal Grandmother    Stroke Maternal  Grandmother    Colon polyps Maternal Grandmother    Colon polyps Maternal Uncle    Colon cancer Maternal Grandfather    Diabetes Paternal Grandmother    Esophageal cancer Neg Hx    Rectal cancer Neg Hx    Stomach cancer Neg Hx     Past Surgical History:  Procedure Laterality Date   COLONOSCOPY     > 10 yrs ago- normal    Social History   Occupational History   Not on file  Tobacco Use   Smoking status: Former    Types: Cigarettes    Quit date: 07/15/2020    Years since quitting: 1.1   Smokeless tobacco: Never  Substance and Sexual Activity   Alcohol use: Yes   Drug use: Not on file   Sexual activity: Not on file

## 2021-09-28 ENCOUNTER — Emergency Department (HOSPITAL_COMMUNITY)
Admission: EM | Admit: 2021-09-28 | Discharge: 2021-09-28 | Disposition: A | Discharge status: Home / Self Care | Payer: 59 | Attending: Medical | Admitting: Medical

## 2021-09-28 ENCOUNTER — Emergency Department (HOSPITAL_COMMUNITY): Payer: 59

## 2021-09-28 ENCOUNTER — Ambulatory Visit (HOSPITAL_COMMUNITY)
Admission: EM | Admit: 2021-09-28 | Discharge: 2021-09-28 | Disposition: A | Discharge status: Other Acute Inpatient Hospital | Payer: 59 | Attending: Internal Medicine | Admitting: Internal Medicine

## 2021-09-28 ENCOUNTER — Other Ambulatory Visit: Payer: Self-pay

## 2021-09-28 ENCOUNTER — Encounter (HOSPITAL_COMMUNITY): Payer: Self-pay | Admitting: Emergency Medicine

## 2021-09-28 ENCOUNTER — Encounter (HOSPITAL_COMMUNITY): Payer: Self-pay | Admitting: *Deleted

## 2021-09-28 DIAGNOSIS — R030 Elevated blood-pressure reading, without diagnosis of hypertension: Secondary | ICD-10-CM | POA: Diagnosis not present

## 2021-09-28 DIAGNOSIS — I161 Hypertensive emergency: Secondary | ICD-10-CM

## 2021-09-28 DIAGNOSIS — M25512 Pain in left shoulder: Secondary | ICD-10-CM | POA: Insufficient documentation

## 2021-09-28 DIAGNOSIS — Z20822 Contact with and (suspected) exposure to covid-19: Secondary | ICD-10-CM | POA: Insufficient documentation

## 2021-09-28 DIAGNOSIS — R202 Paresthesia of skin: Secondary | ICD-10-CM | POA: Diagnosis present

## 2021-09-28 DIAGNOSIS — H43399 Other vitreous opacities, unspecified eye: Secondary | ICD-10-CM | POA: Diagnosis not present

## 2021-09-28 DIAGNOSIS — Y900 Blood alcohol level of less than 20 mg/100 ml: Secondary | ICD-10-CM | POA: Diagnosis not present

## 2021-09-28 LAB — I-STAT CHEM 8, ED
BUN: 11 mg/dL (ref 8–23)
Calcium, Ion: 1.23 mmol/L (ref 1.15–1.40)
Chloride: 102 mmol/L (ref 98–111)
Creatinine, Ser: 1 mg/dL (ref 0.44–1.00)
Glucose, Bld: 130 mg/dL — ABNORMAL HIGH (ref 70–99)
HCT: 48 % — ABNORMAL HIGH (ref 36.0–46.0)
Hemoglobin: 16.3 g/dL — ABNORMAL HIGH (ref 12.0–15.0)
Potassium: 4.6 mmol/L (ref 3.5–5.1)
Sodium: 140 mmol/L (ref 135–145)
TCO2: 28 mmol/L (ref 22–32)

## 2021-09-28 LAB — PROTIME-INR
INR: 1 (ref 0.8–1.2)
Prothrombin Time: 13.4 seconds (ref 11.4–15.2)

## 2021-09-28 LAB — COMPREHENSIVE METABOLIC PANEL
ALT: 20 U/L (ref 0–44)
AST: 16 U/L (ref 15–41)
Albumin: 3.9 g/dL (ref 3.5–5.0)
Alkaline Phosphatase: 56 U/L (ref 38–126)
Anion gap: 6 (ref 5–15)
BUN: 10 mg/dL (ref 8–23)
CO2: 28 mmol/L (ref 22–32)
Calcium: 9.7 mg/dL (ref 8.9–10.3)
Chloride: 104 mmol/L (ref 98–111)
Creatinine, Ser: 0.99 mg/dL (ref 0.44–1.00)
GFR, Estimated: >60 mL/min (ref >60)
Glucose, Bld: 130 mg/dL — ABNORMAL HIGH (ref 70–99)
Potassium: 4.7 mmol/L (ref 3.5–5.1)
Sodium: 138 mmol/L (ref 135–145)
Total Bilirubin: 0.9 mg/dL (ref 0.3–1.2)
Total Protein: 7.2 g/dL (ref 6.5–8.1)

## 2021-09-28 LAB — URINALYSIS, ROUTINE W REFLEX MICROSCOPIC
Bilirubin Urine: NEGATIVE
Glucose, UA: NEGATIVE mg/dL
Hgb urine dipstick: NEGATIVE
Ketones, ur: NEGATIVE mg/dL
Leukocytes,Ua: NEGATIVE
Nitrite: NEGATIVE
Protein, ur: NEGATIVE mg/dL
Specific Gravity, Urine: 1.012 (ref 1.005–1.030)
pH: 5 (ref 5.0–8.0)

## 2021-09-28 LAB — RESP PANEL BY RT-PCR (FLU A&B, COVID) ARPGX2
Influenza A by PCR: NEGATIVE
Influenza B by PCR: NEGATIVE
SARS Coronavirus 2 by RT PCR: NEGATIVE

## 2021-09-28 LAB — CBC
HCT: 47.2 % — ABNORMAL HIGH (ref 36.0–46.0)
Hemoglobin: 15.1 g/dL — ABNORMAL HIGH (ref 12.0–15.0)
MCH: 28.6 pg (ref 26.0–34.0)
MCHC: 32 g/dL (ref 30.0–36.0)
MCV: 89.4 fL (ref 80.0–100.0)
Platelets: 272 10*3/uL (ref 150–400)
RBC: 5.28 MIL/uL — ABNORMAL HIGH (ref 3.87–5.11)
RDW: 12.7 % (ref 11.5–15.5)
WBC: 8.9 10*3/uL (ref 4.0–10.5)
nRBC: 0 % (ref 0.0–0.2)

## 2021-09-28 LAB — TROPONIN I (HIGH SENSITIVITY): Troponin I (High Sensitivity): 4 ng/L (ref <18)

## 2021-09-28 LAB — ETHANOL: Alcohol, Ethyl (B): <10 mg/dL (ref <10)

## 2021-09-28 LAB — APTT: aPTT: 30 seconds (ref 24–36)

## 2021-09-28 LAB — RAPID URINE DRUG SCREEN, HOSP PERFORMED
Amphetamines: NOT DETECTED
Barbiturates: NOT DETECTED
Benzodiazepines: NOT DETECTED
Cocaine: NOT DETECTED
Opiates: NOT DETECTED
Tetrahydrocannabinol: NOT DETECTED

## 2021-09-28 LAB — DIFFERENTIAL
Abs Immature Granulocytes: 0.02 10*3/uL (ref 0.00–0.07)
Basophils Absolute: 0 10*3/uL (ref 0.0–0.1)
Basophils Relative: 1 %
Eosinophils Absolute: 0.2 10*3/uL (ref 0.0–0.5)
Eosinophils Relative: 2 %
Immature Granulocytes: 0 %
Lymphocytes Relative: 30 %
Lymphs Abs: 2.7 10*3/uL (ref 0.7–4.0)
Monocytes Absolute: 0.6 10*3/uL (ref 0.1–1.0)
Monocytes Relative: 7 %
Neutro Abs: 5.4 10*3/uL (ref 1.7–7.7)
Neutrophils Relative %: 60 %

## 2021-09-28 NOTE — Discharge Instructions (Signed)
ED via personal vehicle

## 2021-09-28 NOTE — ED Notes (Signed)
NP in room to assess Pt .

## 2021-09-28 NOTE — ED Triage Notes (Signed)
Pt denies any CP today. . Pt reports for a week her BP has been high ,She has seen spots and felt dizzy.

## 2021-09-28 NOTE — ED Provider Notes (Signed)
Emergency Medicine Provider Triage Evaluation Note  Molly Hanson , a 65 y.o. female  was evaluated in triage.  Pt complains of a tingling sensation to the left side of her face for the past 2 days.  She states that she was at the grocery store when this occurred.  She states that she also had some floaters in her vision.  She assumed that her blood pressure was up however did not check it at that time.  She went home and states her blood pressure was in the 150s.  She is been compliant with her blood pressure medication.  She states that she also noticed some pain to her left shoulder.  Specific chest pain.  The pain has since resolved.  No SOB.   Review of Systems  Positive: + tingling to left face, L shoulder pain Negative: - SOB, confusion, speech changes, facial droop  Physical Exam  BP 134/79 (BP Location: Right Arm)   Pulse 68   Temp 99.1 F (37.3 C) (Oral)   Resp 16   SpO2 95%  Gen:   Awake, no distress   Resp:  Normal effort  MSK:   Moves extremities without difficulty  Other:  Neuro intact.   Medical Decision Making  Medically screening exam initiated at 12:36 PM.  Appropriate orders placed.  KAMRYNN MELOTT was informed that the remainder of the evaluation will be completed by another provider, this initial triage assessment does not replace that evaluation, and the importance of remaining in the ED until their evaluation is complete.     Eustaquio Maize, PA-C 09/28/21 1238    Malvin Johns, MD 09/28/21 1446

## 2021-09-28 NOTE — ED Provider Notes (Signed)
[facility]    CSN: [id number] Arrival date & time: 09/28/21  1121      History   Chief Complaint Chief Complaint  Patient presents with   Hypertension   Lt shoulder pain    HPI Molly Hanson is a 65 y.o. female presenting with elevated blood pressure, seeing spots, dizziness, left arm pain, left sided facial difficulty moving.  Symptoms actually happened 2 days ago, they resolved on their own after minutes, she is now currently without chest pain, headaches, vision changes, weakness.  States that her blood pressure was 150/90 soon after the incident, but she did not monitor it during the incident.  Taking her antihypertensives as directed.  HPI  Past Medical History:  Diagnosis Date   Arthritis    knees, arm    Hyperlipidemia    has been elevated in the past - no meds    Vitamin D deficiency     There are no problems to display for this patient.   Past Surgical History:  Procedure Laterality Date   COLONOSCOPY     > 10 yrs ago- normal     OB History   No obstetric history on file.      Home Medications    Prior to Admission medications   Not on File    Family History Family History  Problem Relation Age of Onset   Breast cancer Mother    Diabetes Mother    Diabetes Father    Diabetes Maternal Grandmother    Stroke Maternal Grandmother    Colon polyps Maternal Grandmother    Colon cancer Maternal Grandfather    Diabetes Paternal Grandmother    Colon polyps Maternal Uncle    Esophageal cancer Neg Hx    Rectal cancer Neg Hx    Stomach cancer Neg Hx     Social History Social History   Tobacco Use   Smoking status: Former    Types: Cigarettes    Quit date: 07/15/2020    Years since quitting: 1.2   Smokeless tobacco: Never  Substance Use Topics   Alcohol use: Yes     Allergies   Patient has no known allergies.   Review of Systems Review of Systems  Eyes:  Positive for visual disturbance.  Neurological:  Positive for  weakness and light-headedness.    Physical Exam Triage Vital Signs ED Triage Vitals  Enc Vitals Group     BP 09/28/21 1132 126/69     Pulse Rate 09/28/21 1132 72     Resp 09/28/21 1132 18     Temp 09/28/21 1132 99 F (37.2 C)     Temp src --      SpO2 09/28/21 1132 97 %     Weight --      Height --      Head Circumference --      Peak Flow --      Pain Score 09/28/21 1130 0     Pain Loc --      Pain Edu? --      Excl. in [place]? --    No data found.  Updated Vital Signs BP 126/69   Pulse 72   Temp 99 F (37.2 C)   Resp 18   SpO2 97%   Visual Acuity Right Eye Distance:   Left Eye Distance:   Bilateral Distance:    Right Eye Near:   Left Eye Near:    Bilateral Near:     Physical Exam Vitals reviewed.  Constitutional:      General: She is not in acute distress.    Appearance: Normal appearance. She is not ill-appearing.  HENT:     Head: Normocephalic and atraumatic.  Eyes:     Extraocular Movements: Extraocular movements intact.     Pupils: Pupils are equal, round, and reactive to light.  Cardiovascular:     Rate and Rhythm: Normal rate and regular rhythm.     Heart sounds: Normal heart sounds.  Pulmonary:     Effort: Pulmonary effort is normal.     Breath sounds: Normal breath sounds. No wheezing, rhonchi or rales.  Musculoskeletal:     Cervical back: Normal range of motion and neck supple. No rigidity.  Lymphadenopathy:     Cervical: No cervical adenopathy.  Skin:    Capillary Refill: Capillary refill takes less than 2 seconds.  Neurological:     General: No focal deficit present.     Mental Status: She is alert and oriented to person, place, and time. Mental status is at baseline.     Cranial Nerves: No cranial nerve deficit or facial asymmetry.     Sensory: Sensation is intact. No sensory deficit.     Motor: Motor function is intact. No weakness.     Coordination: Coordination is intact. Coordination normal.     Gait: Gait is intact. Gait normal.      Comments: PERRLA, EOMI. CN 2-12 intact. No weakness or numbness in UEs or LEs.negative rhomberg, pronator drift.   Psychiatric:        Mood and Affect: Mood normal.        Behavior: Behavior normal.        Thought Content: Thought content normal.        Judgment: Judgment normal.     UC Treatments / Results  Labs (all labs ordered are listed, but only abnormal results are displayed) Labs Reviewed - No data to display  EKG   Radiology No results found.  Procedures Procedures (including critical care time)  Medications Ordered in UC Medications - No data to display  Initial Impression / Assessment and Plan / UC Course  I have reviewed the triage vital signs and the nursing notes.  Pertinent labs & imaging results that were available during my care of the patient were reviewed by me and considered in my medical decision making (see chart for details).     This patient is a very pleasant 65 y.o. year old female presenting 2 days after presumed TIA.  She is currently asymptomatic with blood pressure in normal range.  EKG NSR. Discussed differential and my concern for future neurovascular events, she is amenable to evaluation in the emergency department.  Stable for transport in personal vehicle at this time..   Final Clinical Impressions(s) / UC Diagnoses   Final diagnoses:  Hypertensive emergency     Discharge Instructions      ED via personal vehicle     ED Prescriptions   None    PDMP not reviewed this encounter.   Hazel Sams, PA-C 09/28/21 1224

## 2021-09-28 NOTE — ED Triage Notes (Signed)
Pt reports L side of face felt "funny" since yesterday- numbness/tingling and L shoulder pain.  States face was improved some and pain has resolved.  No arm drift.  No facial droop.

## 2021-10-06 ENCOUNTER — Other Ambulatory Visit: Payer: Self-pay

## 2021-10-06 ENCOUNTER — Encounter: Payer: Self-pay | Admitting: Family Medicine

## 2021-10-06 ENCOUNTER — Ambulatory Visit (INDEPENDENT_AMBULATORY_CARE_PROVIDER_SITE_OTHER): Payer: 59 | Admitting: Family Medicine

## 2021-10-06 VITALS — BP 129/81 | HR 67 | Temp 98.3°F | Resp 16 | Ht 66.0 in | Wt 251.8 lb

## 2021-10-06 DIAGNOSIS — Z6841 Body Mass Index (BMI) 40.0 and over, adult: Secondary | ICD-10-CM

## 2021-10-06 DIAGNOSIS — R200 Anesthesia of skin: Secondary | ICD-10-CM

## 2021-10-06 NOTE — Progress Notes (Signed)
Patient is her for results from ER. Patient has no other concerns today

## 2021-10-07 NOTE — Progress Notes (Signed)
Established Patient Office Visit  Subjective:  Patient ID: Molly Hanson, female    DOB: [birth year]  Age: 65 y.o. MRN: [id number]  CC:  Chief Complaint  Patient presents with   Follow-up    hospital    HPI ALEC MCPHEE presents for follow up of ED visit where patient was seen for hypertensive emergency with facial numbness. She reports that her sx have resolved. Work up was negative for acute stroke. Patient denies acute complaints.   Past Medical History:  Diagnosis Date   Arthritis    knees, arm    Hyperlipidemia    has been elevated in the past - no meds    Vitamin D deficiency     Past Surgical History:  Procedure Laterality Date   COLONOSCOPY     > 10 yrs ago- normal     Family History  Problem Relation Age of Onset   Breast cancer Mother    Diabetes Mother    Diabetes Father    Diabetes Maternal Grandmother    Stroke Maternal Grandmother    Colon polyps Maternal Grandmother    Colon cancer Maternal Grandfather    Diabetes Paternal Grandmother    Colon polyps Maternal Uncle    Esophageal cancer Neg Hx    Rectal cancer Neg Hx    Stomach cancer Neg Hx     Social History   Socioeconomic History   Marital status: Single    Spouse name: Not on file   Number of children: Not on file   Years of education: Not on file   Highest education level: Not on file  Occupational History   Not on file  Tobacco Use   Smoking status: Former    Types: Cigarettes    Quit date: 07/15/2020    Years since quitting: 1.2   Smokeless tobacco: Never  Substance and Sexual Activity   Alcohol use: Yes   Drug use: Not on file   Sexual activity: Not on file  Other Topics Concern   Not on file  Social History Narrative   Not on file   Social Determinants of Health   Financial Resource Strain: Not on file  Food Insecurity: Not on file  Transportation Needs: Not on file  Physical Activity: Not on file  Stress: Not on file  Social Connections: Not on file  Intimate  Partner Violence: Not on file    ROS Review of Systems  Neurological:  Positive for numbness. Negative for facial asymmetry and weakness.  All other systems reviewed and are negative.  Objective:   Today's Vitals: BP 129/81   Pulse 67   Temp 98.3 F (36.8 C) (Oral)   Resp 16   Ht 5\' 6"  (1.676 m)   Wt 251 lb 12.8 oz (114.2 kg)   BMI 40.64 kg/m   Physical Exam Vitals and nursing note reviewed.  Constitutional:      General: She is not in acute distress.    Appearance: She is obese.  HENT:     Head: Normocephalic and atraumatic.  Cardiovascular:     Rate and Rhythm: Normal rate and regular rhythm.  Pulmonary:     Effort: Pulmonary effort is normal.     Breath sounds: Normal breath sounds.  Abdominal:     Palpations: Abdomen is soft.     Tenderness: There is no abdominal tenderness.  Neurological:     General: No focal deficit present.     Mental Status: She is alert and oriented to person,  place, and time.    Assessment & Plan:   1. Left sided numbness Has resolved with no recurrence. BP is also at acceptable level with no meds. 911/ED if sx recur.   2. Class 3 severe obesity due to excess calories without serious comorbidity with body mass index (BMI) of 40.0 to 44.9 in adult [facility]) Discussed dietary and activity options.      Follow-up: No follow-ups on file.   Becky Sax, MD

## 2021-10-08 ENCOUNTER — Ambulatory Visit (INDEPENDENT_AMBULATORY_CARE_PROVIDER_SITE_OTHER): Payer: 59 | Admitting: Physician Assistant

## 2021-10-08 ENCOUNTER — Other Ambulatory Visit: Payer: Self-pay

## 2021-10-08 ENCOUNTER — Encounter: Payer: Self-pay | Admitting: Physician Assistant

## 2021-10-08 ENCOUNTER — Encounter: Payer: Self-pay | Admitting: Family Medicine

## 2021-10-08 DIAGNOSIS — M9262 Juvenile osteochondrosis of tarsus, left ankle: Secondary | ICD-10-CM

## 2021-10-08 DIAGNOSIS — M1712 Unilateral primary osteoarthritis, left knee: Secondary | ICD-10-CM

## 2021-10-08 NOTE — Progress Notes (Signed)
Office Visit Note   Patient: Molly Hanson           Date of Birth: 1956-03-16           MRN: [id number] Visit Date: 10/08/2021              Requested by: Nicolette Bang, MD [address],  [place] [address] PCP: Dorna Mai, MD   Assessment & Plan: Visit Diagnoses:  1. Haglund's deformity, left   2. Primary osteoarthritis of left knee      Plan:  Recommend she continue to work on quad strengthening.  She understands that she needs to wait at least 3 months between cortisone injections.  She may benefit from a supplemental injection in the future given her knee arthritis.  Regards to the Haglund's deformity recommending continued gastrocsoleus stretching and proper shoe wear.  Follow-up as needed.  Follow-Up Instructions:  As needed  Orders:  No orders of the defined types were placed in this encounter.  No orders of the defined types were placed in this encounter.     Procedures: No procedures performed   Clinical Data: No additional findings.   Subjective: Chief Complaint  Patient presents with   Left Knee - Pain, Follow-up   Left Heel - Pain, Follow-up    HPI Mrs. Comes in today for follow-up of her left knee and left heel pain.  She underwent a cortisone injection for the left knee pain on 09/24/2021.  She states left knee is doing well.  She also states that her left heel pain is improved with stretching exercises.  She has been also applying an over-the-counter ointment over the heel that is helped.  She was unable for the Voltaren gel though.  Review of Systems See HPI otherwise negative noncontributory.  Objective: Vital Signs: There were no vitals taken for this visit.  Physical Exam Physical exam: General well-developed well-nourished female no acute distress. Ortho Exam Left knee good range of motion.  Patellofemoral crepitus.  No abnormal warmth erythema. Left heel positive pump bump.  Nontender.  Full dorsiflexion  plantarflexion ankle. Specialty Comments:  No specialty comments available.  Imaging: No results found.   PMFS History: There are no problems to display for this patient.  Past Medical History:  Diagnosis Date   Arthritis    knees, arm    Hyperlipidemia    has been elevated in the past - no meds    Vitamin D deficiency     Family History  Problem Relation Age of Onset   Breast cancer Mother    Diabetes Mother    Diabetes Father    Diabetes Maternal Grandmother    Stroke Maternal Grandmother    Colon polyps Maternal Grandmother    Colon cancer Maternal Grandfather    Diabetes Paternal Grandmother    Colon polyps Maternal Uncle    Esophageal cancer Neg Hx    Rectal cancer Neg Hx    Stomach cancer Neg Hx     Past Surgical History:  Procedure Laterality Date   COLONOSCOPY     > 10 yrs ago- normal    Social History   Occupational History   Not on file  Tobacco Use   Smoking status: Former    Types: Cigarettes    Quit date: 07/15/2020    Years since quitting: 1.2   Smokeless tobacco: Never  Substance and Sexual Activity   Alcohol use: Yes   Drug use: Not on file  Sexual activity: Not on file

## 2021-11-11 IMAGING — MG DIGITAL SCREENING BILAT W/ CAD
4 series · 4 of 4 positions shown · non-contrast
Comparison: Previous exam(s).

CLINICAL DATA: Screening.

EXAM:
DIGITAL SCREENING BILATERAL MAMMOGRAM WITH CAD

[L CC]
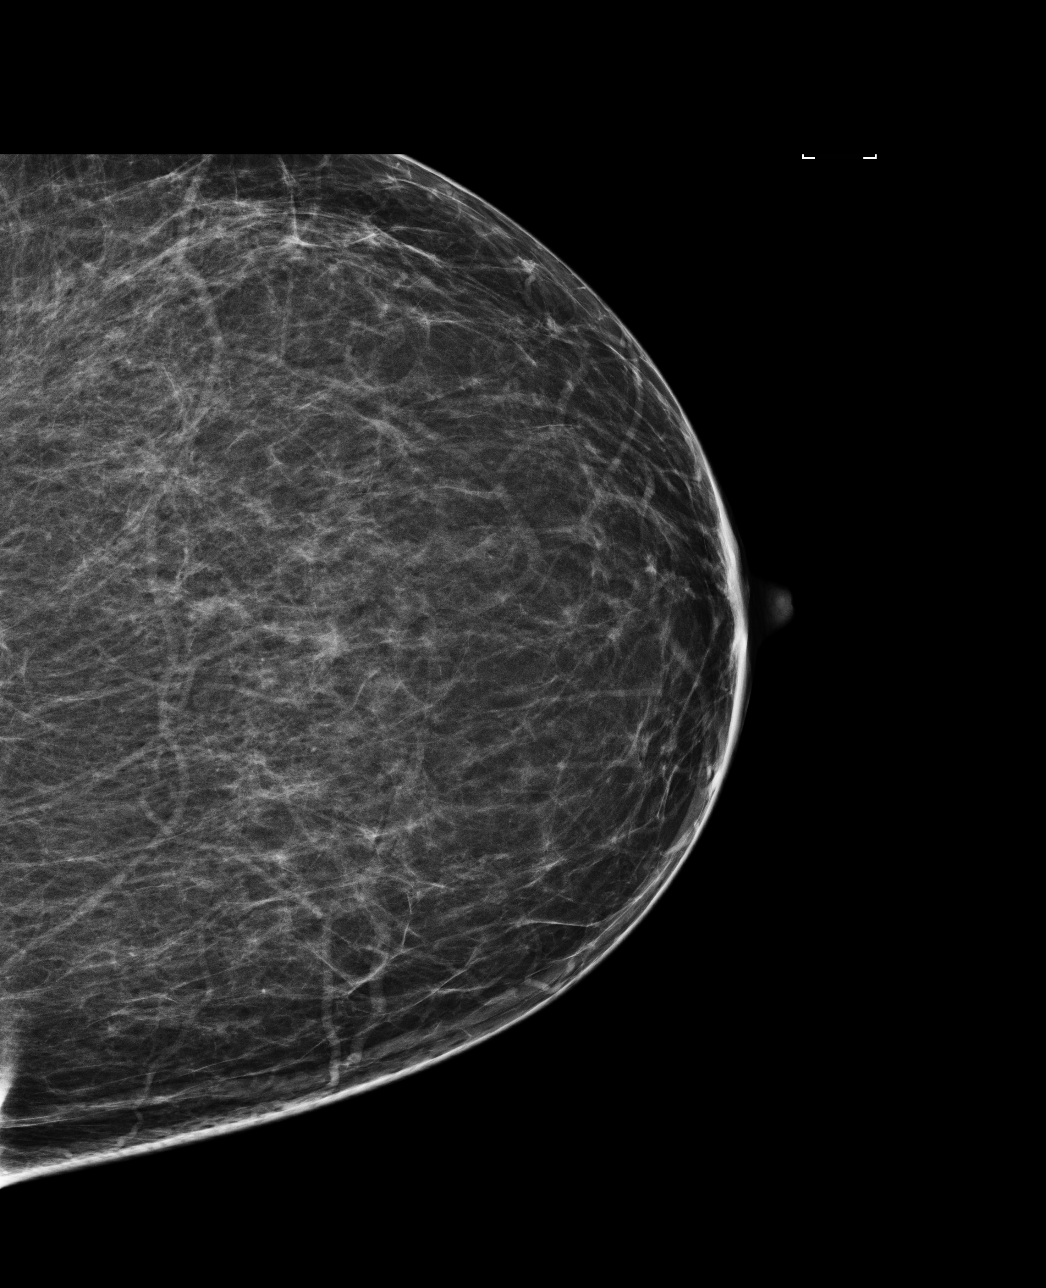

[R MLO]
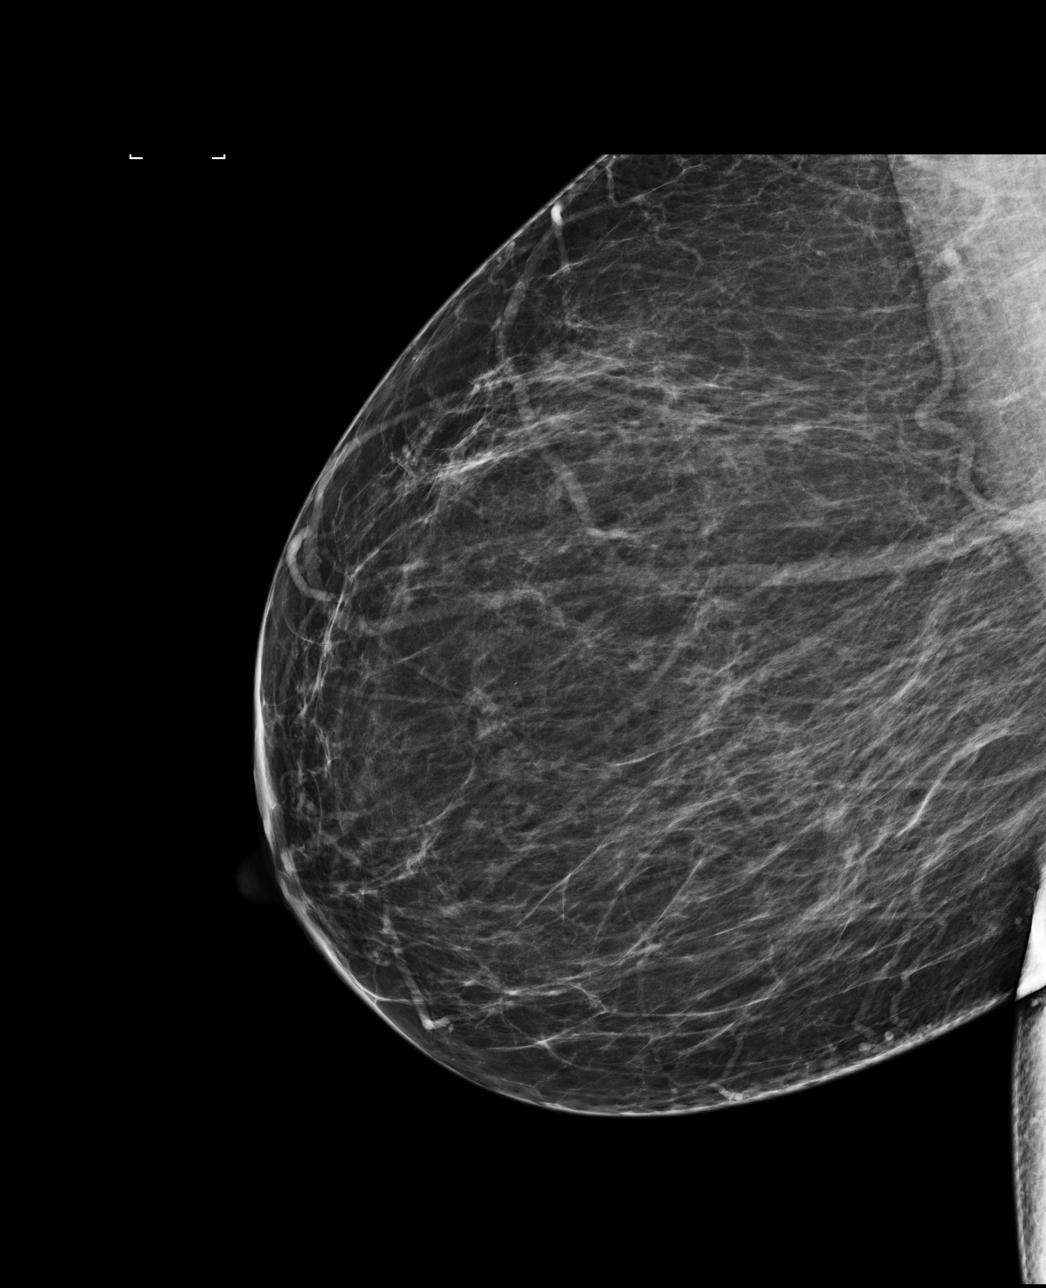

[R CC]
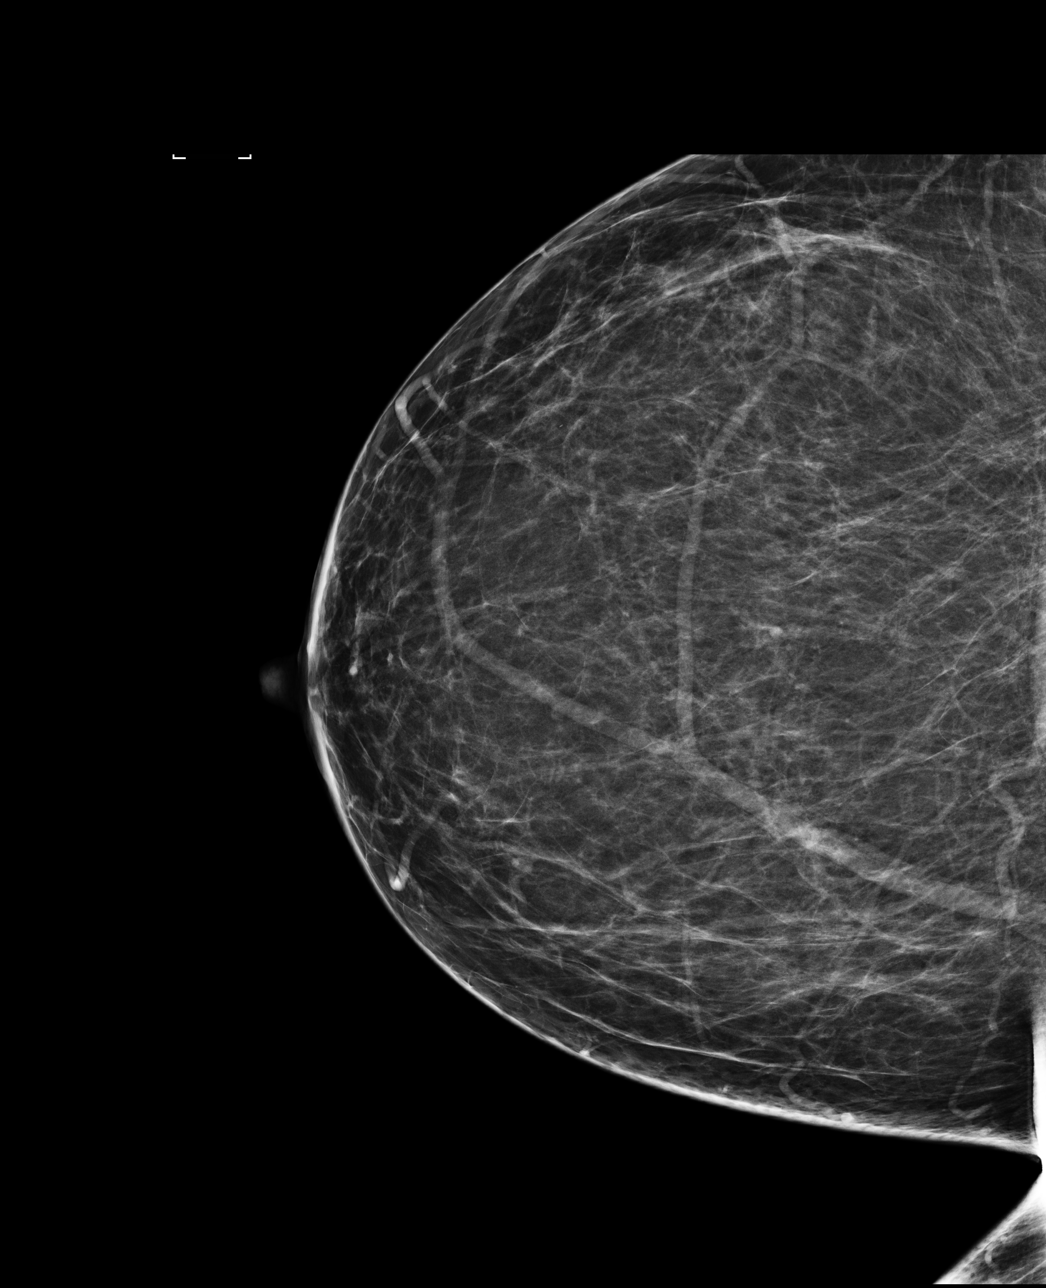

[L MLO]
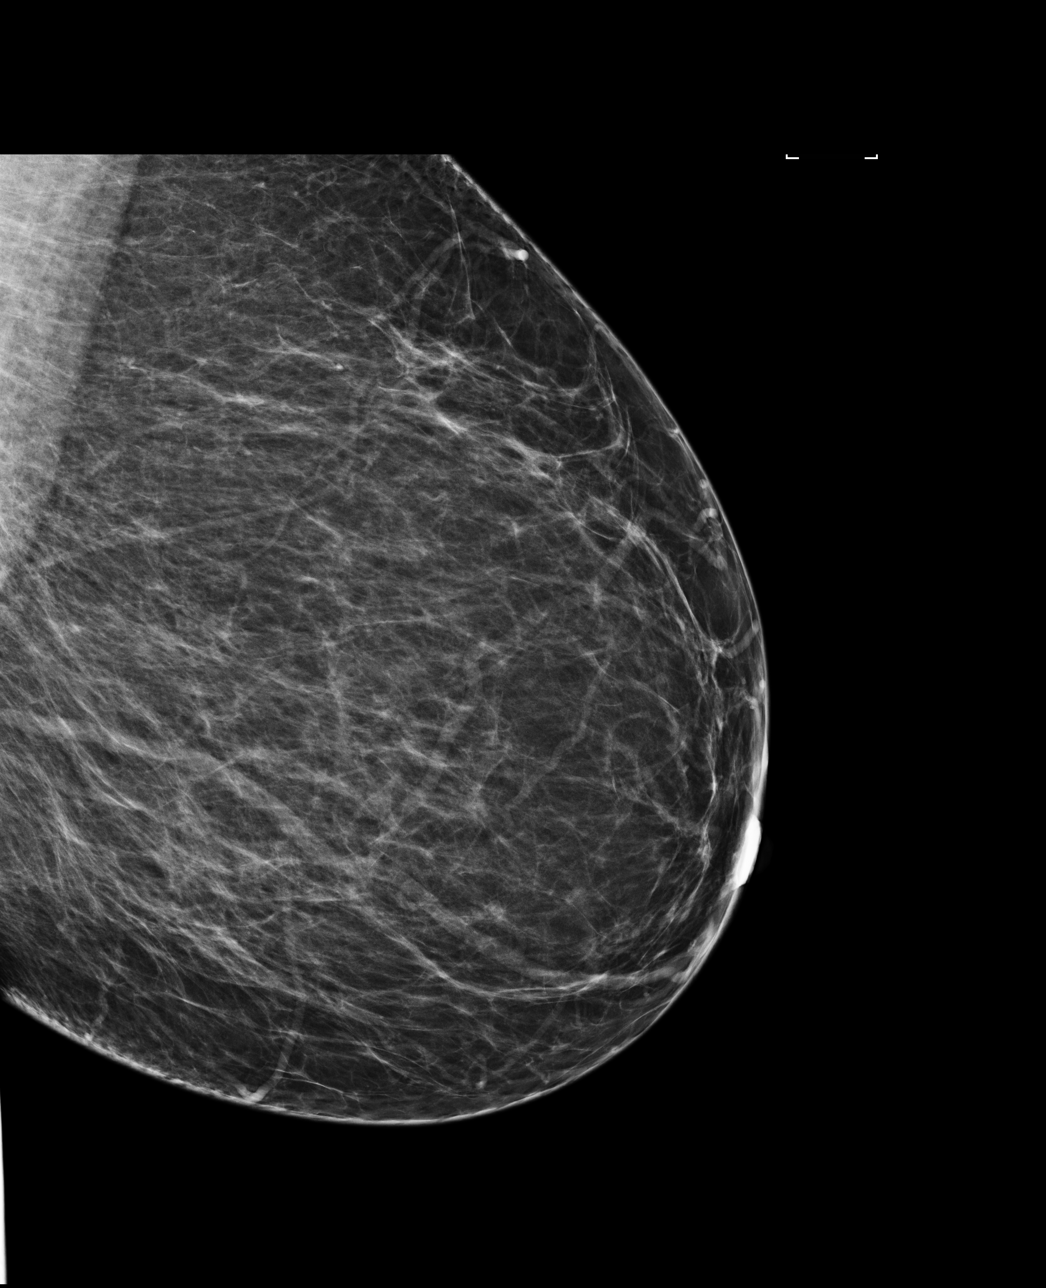

[4 of 4 positions shown; findings below may reference images not displayed]

ACR Breast Density Category b: There are scattered areas of
fibroglandular density.
FINDINGS: There are no findings suspicious for malignancy. Images were
processed with CAD.
IMPRESSION: No mammographic evidence of malignancy. A result letter of this
screening mammogram will be mailed directly to the patient.

RECOMMENDATION:
Screening mammogram in one year. (Code:AS-G-LCT)

BI-RADS CATEGORY  1: Negative.

## 2022-03-18 ENCOUNTER — Encounter: Payer: Self-pay | Admitting: Physician Assistant

## 2022-03-18 ENCOUNTER — Ambulatory Visit (INDEPENDENT_AMBULATORY_CARE_PROVIDER_SITE_OTHER): Payer: 59 | Admitting: Physician Assistant

## 2022-03-18 VITALS — Ht 66.0 in | Wt 263.0 lb

## 2022-03-18 DIAGNOSIS — M1712 Unilateral primary osteoarthritis, left knee: Secondary | ICD-10-CM | POA: Diagnosis not present

## 2022-03-18 MED ORDER — LIDOCAINE HCL 1 % IJ SOLN
3.0000 mL | INTRAMUSCULAR | Status: AC | PRN
  Administered 2022-03-18: 3 mL

## 2022-03-18 MED ORDER — METHYLPREDNISOLONE ACETATE 40 MG/ML IJ SUSP
40.0000 mg | INTRAMUSCULAR | Status: AC | PRN
  Administered 2022-03-18: 40 mg via INTRA_ARTICULAR

## 2022-03-18 NOTE — Progress Notes (Signed)
? ?  Procedure Note ? ?Patient: Molly Hanson             ?Date of Birth: 07-23-1956           ?MRN: [id number]             ?Visit Date: 03/18/2022 ?HPI: Molly Hanson comes in today complaining of left knee pain.  She was last seen on 09/24/2021 for the left knee and was given a cortisone injection.  She states that pain returned last week and until then has been doing well.  She feels that the last injection was quite beneficial.  She has known osteoarthritis of the left knee.  Denies any new injury to the left knee. ? ?Review of systems: Negative for fevers chills.  Patient is nondiabetic. ? ?Physical exam: Bilateral knees good range of motion.  No laxity with valgus varus stressing.  Patellofemoral crepitus with passive range of motion of both knees.  No abnormal warmth erythema or effusion of either knee. ? ? ?Procedures: ?Visit Diagnoses:  ?1. Primary osteoarthritis of left knee   ? ? ?Large Joint Inj: L knee on 03/18/2022 2:54 PM ?Indications: pain ?Details: 22 G 1.5 in needle, anterolateral approach ? ?Arthrogram: No ? ?Medications: 3 mL lidocaine 1 %; 40 mg methylPREDNISolone acetate 40 MG/ML ?Outcome: tolerated well, no immediate complications ?Procedure, treatment alternatives, risks and benefits explained, specific risks discussed. Consent was given by the patient. Immediately prior to procedure a time out was called to verify the correct patient, procedure, equipment, support staff and site/side marked as required. Patient was prepped and draped in the usual sterile fashion.  ? ? ?Plan: She will work on quad strengthening exercises as shown.  She may benefit from supplemental injections in the future if she fails cortisone injections.  She understands to wait at least 3 months between injections.  Questions were encouraged and answered ? ? ?

## 2022-04-11 NOTE — Progress Notes (Signed)
Patient ID: CARELY Hanson, female    DOB: [birth year]  MRN: [id number]  CC: Dysphagia   Subjective: Molly Hanson is a 66 y.o. female who presents for dysphagia.   Her concerns today include: Reports every time when eating or drinking she chokes. Doesn't matter the food or beverage type. She is still eating and drinking as normal just does it at slower pace she says. Has occasional nonproductive cough. States "I feel like something is stuck in my throat." Feels radiation to upper stomach. Denies nausea, vomiting, shortness of breath, chest pain and additional red flag symptoms. No history of acid reflux. Reports she is up to date on colon cancer screening. Would like referral to gastroenterology for endoscopy.    There are no problems to display for this patient.    Current Outpatient Medications on File Prior to Visit  Medication Sig Dispense Refill   albuterol (VENTOLIN HFA) 108 (90 Base) MCG/ACT inhaler Inhale 2 puffs into the lungs every 6 (six) hours as needed for wheezing or shortness of breath.     clotrimazole-betamethasone (LOTRISONE) cream Apply 1 application. topically 2 (two) times daily.     No current facility-administered medications on file prior to visit.    No Known Allergies  Social History   Socioeconomic History   Marital status: Single    Spouse name: Not on file   Number of children: Not on file   Years of education: Not on file   Highest education level: Not on file  Occupational History   Not on file  Tobacco Use   Smoking status: Former    Types: Cigarettes    Quit date: 07/15/2020    Years since quitting: 1.7    Passive exposure: Past   Smokeless tobacco: Never  Substance and Sexual Activity   Alcohol use: Yes   Drug use: Not on file   Sexual activity: Not on file  Other Topics Concern   Not on file  Social History Narrative   Not on file   Social Determinants of Health   Financial Resource Strain: Not on file  Food Insecurity: Not  on file  Transportation Needs: Not on file  Physical Activity: Not on file  Stress: Not on file  Social Connections: Not on file  Intimate Partner Violence: Not on file    Family History  Problem Relation Age of Onset   Breast cancer Mother    Diabetes Mother    Diabetes Father    Diabetes Maternal Grandmother    Stroke Maternal Grandmother    Colon polyps Maternal Grandmother    Colon cancer Maternal Grandfather    Diabetes Paternal Grandmother    Colon polyps Maternal Uncle    Esophageal cancer Neg Hx    Rectal cancer Neg Hx    Stomach cancer Neg Hx     Past Surgical History:  Procedure Laterality Date   COLONOSCOPY     > 10 yrs ago- normal     ROS: Review of Systems Negative except as stated above  PHYSICAL EXAM: BP 112/75 (BP Location: Left Arm, Patient Position: Sitting, Cuff Size: Large)   Pulse 73   Temp 98.5 F (36.9 C)   Resp 18   Ht 5' 5.98" (1.676 m)   Wt 253 lb (114.8 kg)   SpO2 93%   BMI 40.86 kg/m   Physical Exam HENT:     Head: Normocephalic and atraumatic.  Eyes:     Extraocular Movements: Extraocular movements intact.  Conjunctiva/sclera: Conjunctivae normal.     Pupils: Pupils are equal, round, and reactive to light.  Cardiovascular:     Rate and Rhythm: Normal rate and regular rhythm.     Pulses: Normal pulses.     Heart sounds: Normal heart sounds.  Pulmonary:     Effort: Pulmonary effort is normal.     Breath sounds: Normal breath sounds.  Musculoskeletal:     Cervical back: Normal range of motion and neck supple.  Neurological:     General: No focal deficit present.     Mental Status: She is alert and oriented to person, place, and time.  Psychiatric:        Mood and Affect: Mood normal.        Behavior: Behavior normal.    ASSESSMENT AND PLAN: 1. Dysphagia, unspecified type: - Per patient preference referral to Gastroenterology for further evaluation and management.   - Ambulatory referral to  Gastroenterology   Patient was given the opportunity to ask questions.  Patient verbalized understanding of the plan and was able to repeat key elements of the plan. Patient was given clear instructions to go to Emergency Department or return to medical center if symptoms don't improve, worsen, or new problems develop.The patient verbalized understanding.   Orders Placed This Encounter  Procedures   Ambulatory referral to Gastroenterology    Follow-up with PCP as scheduled.   Camillia Herter, NP

## 2022-04-13 ENCOUNTER — Ambulatory Visit
Admission: EM | Admit: 2022-04-13 | Discharge: 2022-04-13 | Disposition: A | Discharge status: Home / Self Care | Payer: 59 | Attending: Family Medicine | Admitting: Family Medicine

## 2022-04-13 DIAGNOSIS — R42 Dizziness and giddiness: Secondary | ICD-10-CM | POA: Diagnosis not present

## 2022-04-13 LAB — POCT FASTING CBG KUC MANUAL ENTRY: POCT Glucose (KUC): 123 mg/dL — AB (ref 70–99)

## 2022-04-13 NOTE — ED Triage Notes (Signed)
Patient presents to Urgent Care with complaints of episodes of lightheadedness, dizziness, and blurred vision since last week. Patient reports this has happened a few times since last week. Pt reports she was laying down when episodes happened.

## 2022-04-13 NOTE — ED Provider Notes (Signed)
[name]    CSN: [id number] Arrival date & time: 04/13/22  7322      History   Chief Complaint Chief Complaint  Patient presents with   Dizziness    HPI Molly Hanson is a 66 y.o. female.   Presenting today with 2 isolated episodes of feeling lightheaded with mild blurry vision.  States the first episode was 1 day last week lasting only several minutes and seeming to be when going from laying to sitting up.  She had a similar episode last night where she was laying down and she went to get up and had a brief episode of the same.  These have been self resolving after several minutes and she states she feels at her baseline health right now.  She denies associated nausea, vomiting, room spinning, chest pain, shortness of breath, palpitations, complete loss of vision, numbness or tingling or weakness of extremities.  Did not try any medications for symptoms.  Has a history of vertigo and states this felt a bit different.  No known chronic neurologic or cardiac issues.  Not taking any medications.   Past Medical History:  Diagnosis Date   Arthritis    knees, arm    Hyperlipidemia    has been elevated in the past - no meds    Vitamin D deficiency     There are no problems to display for this patient.   Past Surgical History:  Procedure Laterality Date   COLONOSCOPY     > 10 yrs ago- normal     OB History   No obstetric history on file.      Home Medications    Prior to Admission medications   Not on File    Family History Family History  Problem Relation Age of Onset   Breast cancer Mother    Diabetes Mother    Diabetes Father    Diabetes Maternal Grandmother    Stroke Maternal Grandmother    Colon polyps Maternal Grandmother    Colon cancer Maternal Grandfather    Diabetes Paternal Grandmother    Colon polyps Maternal Uncle    Esophageal cancer Neg Hx    Rectal cancer Neg Hx    Stomach cancer Neg Hx     Social History Social History    Tobacco Use   Smoking status: Former    Types: Cigarettes    Quit date: 07/15/2020    Years since quitting: 1.7   Smokeless tobacco: Never  Substance Use Topics   Alcohol use: Yes     Allergies   Patient has no known allergies.   Review of Systems Review of Systems Per HPI  Physical Exam Triage Vital Signs ED Triage Vitals  Enc Vitals Group     BP 04/13/22 0955 138/83     Pulse Rate 04/13/22 0955 63     Resp 04/13/22 0955 16     Temp 04/13/22 0955 97.9 F (36.6 C)     Temp Source 04/13/22 0955 Oral     SpO2 04/13/22 0956 95 %     Weight --      Height --      Head Circumference --      Peak Flow --      Pain Score 04/13/22 0954 0     Pain Loc --      Pain Edu? --      Excl. in GC? --    Orthostatic VS for the past 24 hrs:  BP- Lying Pulse-  Lying BP- Sitting Pulse- Sitting BP- Standing at 0 minutes Pulse- Standing at 0 minutes  04/13/22 1024 139/67 66 131/78 73 145/78 76    Updated Vital Signs BP 138/83   Pulse 63   Temp 97.9 F (36.6 C) (Oral)   Resp 16   SpO2 95%   Visual Acuity Right Eye Distance:   Left Eye Distance:   Bilateral Distance:    Right Eye Near:   Left Eye Near:    Bilateral Near:     Physical Exam Vitals and nursing note reviewed.  Constitutional:      Appearance: Normal appearance. She is not ill-appearing.  HENT:     Head: Atraumatic.     Right Ear: Tympanic membrane normal.     Left Ear: Tympanic membrane normal.     Nose: Nose normal.     Mouth/Throat:     Mouth: Mucous membranes are moist.     Pharynx: Oropharynx is clear.  Eyes:     Extraocular Movements: Extraocular movements intact.     Conjunctiva/sclera: Conjunctivae normal.     Pupils: Pupils are equal, round, and reactive to light.  Cardiovascular:     Rate and Rhythm: Normal rate and regular rhythm.     Heart sounds: Normal heart sounds.  Pulmonary:     Effort: Pulmonary effort is normal.     Breath sounds: Normal breath sounds.  Musculoskeletal:         General: Normal range of motion.     Cervical back: Normal range of motion and neck supple.  Skin:    General: Skin is warm and dry.  Neurological:     General: No focal deficit present.     Mental Status: She is alert and oriented to person, place, and time.     Cranial Nerves: No cranial nerve deficit.     Motor: No weakness.     Gait: Gait normal.  Psychiatric:        Mood and Affect: Mood normal.        Thought Content: Thought content normal.        Judgment: Judgment normal.     UC Treatments / Results  Labs (all labs ordered are listed, but only abnormal results are displayed) Labs Reviewed  POCT FASTING CBG [facility] MANUAL ENTRY - Abnormal; Notable for the following components:      Result Value   POCT Glucose ([facility]) 123 (*)    All other components within normal limits  CBC WITH DIFFERENTIAL/PLATELET  COMPREHENSIVE METABOLIC PANEL  TSH    EKG   Radiology No results found.  Procedures Procedures (including critical care time)  Medications Ordered in UC Medications - No data to display  Initial Impression / Assessment and Plan / UC Course  I have reviewed the triage vital signs and the nursing notes.  Pertinent labs & imaging results that were available during my care of the patient were reviewed by me and considered in my medical decision making (see chart for details).     Vital signs benign and within normal limits today exam reassuring with no focal deficits or abnormalities, point-of-care and random glucose 123, EKG showing sinus bradycardia at 54 bpm with no acute ST or T wave changes, orthostatic vital signs appropriate.  Labs pending for further rule out, discussed to continue balanced diet, good fluid intake, monitoring and go to the emergency department if worsening.  She states she is seeing her primary care provider on Friday and will follow-up then.  45-minute  spent today in direct patient care, evaluation and education.  Final Clinical  Impressions(s) / UC Diagnoses   Final diagnoses:  Dizziness   Discharge Instructions   None    ED Prescriptions   None    PDMP not reviewed this encounter.   Volney American, [place] 04/13/22 1137

## 2022-04-14 LAB — CBC WITH DIFFERENTIAL/PLATELET
Basophils Absolute: 0 10*3/uL (ref 0.0–0.2)
Basos: 1 %
EOS (ABSOLUTE): 0.1 10*3/uL (ref 0.0–0.4)
Eos: 1 %
Hematocrit: 45.5 % (ref 34.0–46.6)
Hemoglobin: 15.1 g/dL (ref 11.1–15.9)
Immature Grans (Abs): 0 10*3/uL (ref 0.0–0.1)
Immature Granulocytes: 0 %
Lymphocytes Absolute: 2.2 10*3/uL (ref 0.7–3.1)
Lymphs: 28 %
MCH: 28.4 pg (ref 26.6–33.0)
MCHC: 33.2 g/dL (ref 31.5–35.7)
MCV: 86 fL (ref 79–97)
Monocytes Absolute: 0.4 10*3/uL (ref 0.1–0.9)
Monocytes: 5 %
Neutrophils Absolute: 5.1 10*3/uL (ref 1.4–7.0)
Neutrophils: 65 %
Platelets: 255 10*3/uL (ref 150–450)
RBC: 5.31 x10E6/uL — ABNORMAL HIGH (ref 3.77–5.28)
RDW: 11.9 % (ref 11.7–15.4)
WBC: 7.8 10*3/uL (ref 3.4–10.8)

## 2022-04-14 LAB — COMPREHENSIVE METABOLIC PANEL
ALT: 18 IU/L (ref 0–32)
AST: 17 IU/L (ref 0–40)
Albumin/Globulin Ratio: 1.6 (ref 1.2–2.2)
Albumin: 4.5 g/dL (ref 3.8–4.8)
Alkaline Phosphatase: 88 IU/L (ref 44–121)
BUN/Creatinine Ratio: 9 — ABNORMAL LOW (ref 12–28)
BUN: 8 mg/dL (ref 8–27)
Bilirubin Total: 0.5 mg/dL (ref 0.0–1.2)
CO2: 23 mmol/L (ref 20–29)
Calcium: 9.6 mg/dL (ref 8.7–10.3)
Chloride: 102 mmol/L (ref 96–106)
Creatinine, Ser: 0.89 mg/dL (ref 0.57–1.00)
Globulin, Total: 2.9 g/dL (ref 1.5–4.5)
Glucose: 112 mg/dL — ABNORMAL HIGH (ref 70–99)
Potassium: 4.2 mmol/L (ref 3.5–5.2)
Sodium: 141 mmol/L (ref 134–144)
Total Protein: 7.4 g/dL (ref 6.0–8.5)
eGFR: 73 mL/min/{1.73_m2} (ref >59)

## 2022-04-14 LAB — TSH: TSH: 1.04 u[IU]/mL (ref 0.450–4.500)

## 2022-04-17 ENCOUNTER — Ambulatory Visit (INDEPENDENT_AMBULATORY_CARE_PROVIDER_SITE_OTHER): Payer: 59 | Admitting: Family

## 2022-04-17 ENCOUNTER — Encounter: Payer: Self-pay | Admitting: Family

## 2022-04-17 VITALS — BP 112/75 | HR 73 | Temp 98.5°F | Resp 18 | Ht 65.98 in | Wt 253.0 lb

## 2022-04-17 DIAGNOSIS — R1310 Dysphagia, unspecified: Secondary | ICD-10-CM

## 2022-04-17 NOTE — Progress Notes (Signed)
Pt presents for request for endoscopy for dysphagia, pt states that every time she eats something feels like lodged in her throat

## 2022-04-30 ENCOUNTER — Encounter: Payer: Self-pay | Admitting: Physician Assistant

## 2022-04-30 ENCOUNTER — Ambulatory Visit (INDEPENDENT_AMBULATORY_CARE_PROVIDER_SITE_OTHER): Payer: Self-pay | Admitting: Physician Assistant

## 2022-04-30 VITALS — BP 118/78 | HR 78 | Ht 66.0 in | Wt 257.0 lb

## 2022-04-30 DIAGNOSIS — R1310 Dysphagia, unspecified: Secondary | ICD-10-CM

## 2022-04-30 DIAGNOSIS — R1013 Epigastric pain: Secondary | ICD-10-CM

## 2022-04-30 DIAGNOSIS — Z86010 Personal history of colonic polyps: Secondary | ICD-10-CM

## 2022-04-30 NOTE — Progress Notes (Signed)
Agree with assessment and plan as outlined.  

## 2022-04-30 NOTE — Patient Instructions (Signed)
If you are age 64 or younger, your body mass index should be between 19-25. Your Body mass index is 41.48 kg/m. If this is out of the aformentioned range listed, please consider follow up with your Primary Care Provider.  ________________________________________________________  The [facility] providers would like to encourage you to use [facility] to communicate with providers for non-urgent requests or questions.  Due to long hold times on the telephone, sending your provider a message by [facility] may be a faster and more efficient way to get a response.  Please allow 48 business hours for a response.  Please remember that this is for non-urgent requests.  _______________________________________________________  Dennis Bast have been scheduled for an endoscopy. Please follow written instructions given to you at your visit today. If you use inhalers (even only as needed), please bring them with you on the day of your procedure.  Cut food into small pieces, sip liquids between bites and avoid dry meat/bread.  Follow up pending the results of your Endoscopy.  Thank you for entrusting me with your care and choosing [facility].  Amy Esterwood, PA-C

## 2022-04-30 NOTE — Progress Notes (Signed)
Subjective:    Patient ID: Molly Hanson, female    DOB: 1956/11/13, 66 y.o.   MRN: [id number]  HPI  [name] is a pleasant 66 year old African-American female, established with Dr. Havery [name].  She has been seen here previously for colonoscopy in January 2022 for screening.  She was found to have multiple diverticuli, and 3 small polyps which were removed which were all tubular adenomas, also noted internal hemorrhoids.  She is indicated for 5-year interval follow-up.  Patient comes in today with complaints of dysphagia.  She says she had COVID about 1 year ago and symptoms started at some point shortly after that.  She does feel that her symptoms have gradually progressed over the past year.  She had an episode recently of what sounds acute transient food impaction with food "sticking " in the esophagus which would not go down.  Symptoms lasted for couple of hours and eventually food traversed, no regurgitation. She says she is not having symptoms every day but frequently has symptoms of dysphagia to solid foods with a sensation of the food sitting in her esophagus for a while .  She frequently has to drink liquids to try to push the food down.  She does not have any difficulty with liquids alone.  No daytime heartburn or indigestion but wonders if she has some symptoms as she says she wakes up frequently in the mornings with some irritation in her throat and then frequent throat clearing. She also notes some discomfort in her epigastrium up towards the left upper quadrant over the past couple of months which she feels is exacerbated by bending over.  This does not have any association with eating, no nausea or vomiting.  Sometimes can feel little discomfort when lying down at night.  Appetite has been fine, weight has been stable, bowel movements are normal Other medical problems include obesity, with BMI of 41.4.  Review of Systems Pertinent positive and negative review of systems were noted in  the above HPI section.  All other review of systems was otherwise negative.   Outpatient Encounter Medications as of 04/30/2022  Medication Sig   albuterol (VENTOLIN HFA) 108 (90 Base) MCG/ACT inhaler Inhale 2 puffs into the lungs every 6 (six) hours as needed for wheezing or shortness of breath.   clotrimazole-betamethasone (LOTRISONE) cream Apply 1 application. topically 2 (two) times daily.   No facility-administered encounter medications on file as of 04/30/2022.   No Known Allergies Patient Active Problem List   Diagnosis Date Noted   Hx of adenomatous colonic polyps 04/30/2022   Social History   Socioeconomic History   Marital status: Single    Spouse name: Not on file   Number of children: Not on file   Years of education: Not on file   Highest education level: Not on file  Occupational History   Not on file  Tobacco Use   Smoking status: Former    Types: Cigarettes    Quit date: 07/15/2020    Years since quitting: 1.7    Passive exposure: Past   Smokeless tobacco: Never  Substance and Sexual Activity   Alcohol use: Yes   Drug use: Never   Sexual activity: Not on file  Other Topics Concern   Not on file  Social History Narrative   Not on file   Social Determinants of Health   Financial Resource Strain: Not on file  Food Insecurity: Not on file  Transportation Needs: Not on file  Physical Activity: Not on  file  Stress: Not on file  Social Connections: Not on file  Intimate Partner Violence: Not on file    Ms. Zollinger's family history includes Breast cancer in her mother; Colon cancer in her maternal grandfather; Colon polyps in her maternal grandmother and maternal uncle; Diabetes in her father, maternal grandmother, mother, and paternal grandmother; Stroke in her maternal grandmother.      Objective:    Vitals:   04/30/22 1337  BP: 118/78  Pulse: 78    Physical Exam Well-developed well-nourished older AA female  in no acute distress.Pleasant   Height,  Weight,257  BMI 41.48  HEENT; nontraumatic normocephalic, EOMI, PE R LA, sclera anicteric. Oropharynx;not examined today  Neck; supple, no JVD Cardiovascular; regular rate and rhythm with S1-S2, no murmur rub or gallop Pulmonary; Clear bilaterally Abdomen; soft,obese  nontender, nondistended, no palpable mass or hepatosplenomegaly, bowel sounds are active Rectal;not done Skin; benign exam, no jaundice rash or appreciable lesions Extremities; no clubbing cyanosis or edema skin warm and dry Neuro/Psych; alert and oriented x4, grossly nonfocal mood and affect appropriate        Assessment & Plan:   #27 66 year old African-American female with 1 year history of somewhat progressive intermittent solid food dysphagia, 1 recent episode with what sounds like transient food impaction with symptoms lasting for a couple of hours and then eventually going down, did not require regurgitation.  #2 early morning throat irritation and throat clearing-question manifestation of GERD  #3 history of adenomatous colon polyps-up-to-date with colonoscopy last done January 2022 and indicated for 5-year interval follow-up #4 diverticulosis #5 morbid obesity BMI 41.4  Plan; patient will be scheduled for upper endoscopy with possible esophageal dilation with Dr. Havery [name].  Procedure was discussed in detail with the patient including indications risks and benefits and she is agreeable to proceed. We discussed avoiding larger pieces of meat, and dry foods like bread.  Advise she cut her food into small pieces, chew carefully and sip liquids between bites We discussed adding a PPI, she would like to wait and see about findings of EGD, prior to starting medication.  Follow-up colonoscopy January 2027  Alfredia Ferguson PA-C 04/30/2022   Cc: Dorna Mai, MD

## 2022-05-17 ENCOUNTER — Encounter: Payer: Self-pay | Admitting: Certified Registered Nurse Anesthetist

## 2022-05-19 ENCOUNTER — Encounter: Payer: Self-pay | Admitting: Certified Registered Nurse Anesthetist

## 2022-05-20 ENCOUNTER — Ambulatory Visit (AMBULATORY_SURGERY_CENTER): Payer: Commercial Managed Care - HMO | Admitting: Gastroenterology

## 2022-05-20 ENCOUNTER — Encounter: Payer: Self-pay | Admitting: Gastroenterology

## 2022-05-20 VITALS — BP 126/79 | HR 70 | Temp 98.4°F | Resp 13 | Ht 66.0 in | Wt 257.0 lb

## 2022-05-20 DIAGNOSIS — K31819 Angiodysplasia of stomach and duodenum without bleeding: Secondary | ICD-10-CM | POA: Diagnosis not present

## 2022-05-20 DIAGNOSIS — R1310 Dysphagia, unspecified: Secondary | ICD-10-CM

## 2022-05-20 DIAGNOSIS — K222 Esophageal obstruction: Secondary | ICD-10-CM

## 2022-05-20 DIAGNOSIS — K2210 Ulcer of esophagus without bleeding: Secondary | ICD-10-CM

## 2022-05-20 DIAGNOSIS — K2100 Gastro-esophageal reflux disease with esophagitis, without bleeding: Secondary | ICD-10-CM | POA: Diagnosis not present

## 2022-05-20 MED ORDER — SODIUM CHLORIDE 0.9 % IV SOLN: 500.0000 mL | Freq: Once | INTRAVENOUS | Status: DC

## 2022-05-20 NOTE — Op Note (Signed)
[facility] Patient Name: Molly Hanson Procedure Date: 05/20/2022 9:48 AM MRN: [id number] Endoscopist: Remo Lipps P. Havery [name] , MD Age: 67 Referring MD:  Date of Birth: [birth year] Gender: Female Account #: [id number] Procedure:                Upper GI endoscopy Indications:              Dysphagia Medicines:                Monitored Anesthesia Care Procedure:                Pre-Anesthesia Assessment:                           - Prior to the procedure, a History and Physical                            was performed, and patient medications and                            allergies were reviewed. The patient's tolerance of                            previous anesthesia was also reviewed. The risks                            and benefits of the procedure and the sedation                            options and risks were discussed with the patient.                            All questions were answered, and informed consent                            was obtained. Prior Anticoagulants: The patient has                            taken no previous anticoagulant or antiplatelet                            agents. ASA Grade Assessment: III - A patient with                            severe systemic disease. After reviewing the risks                            and benefits, the patient was deemed in                            satisfactory condition to undergo the procedure.                           After obtaining informed consent, the endoscope was  passed under direct vision. Throughout the                            procedure, the patient's blood pressure, pulse, and                            oxygen saturations were monitored continuously. The                            GIF [id number] #[id number] was introduced through the                            mouth, and advanced to the second part of duodenum.                            The upper GI endoscopy was  accomplished without                            difficulty. The patient tolerated the procedure                            well. Scope In: Scope Out: Findings:                 Esophagogastric landmarks were identified: the                            Z-line was found at 35 cm, the gastroesophageal                            junction was found at 35 cm and the upper extent of                            the gastric folds was found at 37 cm from the                            incisors.                           A 2 cm hiatal hernia was present.                           A widely patent and non-obstructing Schatzki ring                            was found at the gastroesophageal junction                           The exam of the esophagus was otherwise normal. No                            inflammatory changes.                           A guidewire was placed and the scope was  withdrawn.                            Dilation was performed in the entire esophagus with                            a Savary dilator with mild resistance at 17 mm.                           Biopsies were taken with a cold forceps in the                            entire esophagus for histology and to open up the                            Shatski ring.                           The entire examined stomach was normal.                           A single diminutive angiodysplastic lesion was                            found in the duodenal bulb.                           The exam of the duodenum was otherwise normal. Complications:            No immediate complications. Estimated blood loss:                            Minimal. Estimated Blood Loss:     Estimated blood loss was minimal. Impression:               - Esophagogastric landmarks identified.                           - 2 cm hiatal hernia.                           - Widely patent and non-obstructing Schatzki ring.                           - Normal esophagus  otherwise - dilated and biopsies                            obtained.                           - Normal stomach.                           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                           - Normal duodenum otherwise.  Very mild Shatski ring noted but given so widely                            patent I think unlikely the cause of symptoms. If                            dysphagia persists and biopsies negative would                            consider manometry testing. Recommendation:           - Patient has a contact number available for                            emergencies. The signs and symptoms of potential                            delayed complications were discussed with the                            patient. Return to normal activities tomorrow.                            Written discharge instructions were provided to the                            patient.                           - Resume previous diet.                           - Continue present medications.                           - Await pathology results and course post dilation. Remo Lipps P. Gwenyth Dingee, MD 05/20/2022 10:15:36 AM This report has been signed electronically.

## 2022-05-20 NOTE — Progress Notes (Signed)
Report given to PACU, vss 

## 2022-05-20 NOTE — Progress Notes (Signed)
0949 Robinul 0.1 mg IV given due large amount of secretions upon assessment.  MD made aware, vss

## 2022-05-20 NOTE — Progress Notes (Signed)
Called to room to assist during endoscopic procedure.  Patient ID and intended procedure confirmed with present staff. Received instructions for my participation in the procedure from the performing physician.  

## 2022-05-20 NOTE — Patient Instructions (Signed)
Resume previous diet.  Continue present medications.  Await pathology results.   YOU HAD AN ENDOSCOPIC PROCEDURE TODAY AT [facility]:   Refer to the procedure report that was given to you for any specific questions about what was found during the examination.  If the procedure report does not answer your questions, please call your gastroenterologist to clarify.  If you requested that your care partner not be given the details of your procedure findings, then the procedure report has been included in a sealed envelope for you to review at your convenience later.  YOU SHOULD EXPECT: Some feelings of bloating in the abdomen. Passage of more gas than usual.  Walking can help get rid of the air that was put into your GI tract during the procedure and reduce the bloating. If you had a lower endoscopy (such as a colonoscopy or flexible sigmoidoscopy) you may notice spotting of blood in your stool or on the toilet paper. If you underwent a bowel prep for your procedure, you may not have a normal bowel movement for a few days.  Please Note:  You might notice some irritation and congestion in your nose or some drainage.  This is from the oxygen used during your procedure.  There is no need for concern and it should clear up in a day or so.  SYMPTOMS TO REPORT IMMEDIATELY:   Following upper endoscopy (EGD)  Vomiting of blood or coffee ground material  New chest pain or pain under the shoulder blades  Painful or persistently difficult swallowing  New shortness of breath  Fever of 100F or higher  Black, tarry-looking stools  For urgent or emergent issues, a gastroenterologist can be reached at any hour by calling [phone]. Do not use MyChart messaging for urgent concerns.    DIET:  We do recommend a small meal at first, but then you may proceed to your regular diet.  Drink plenty of fluids but you should avoid alcoholic beverages for 24 hours.  ACTIVITY:  You should plan to  take it easy for the rest of today and you should NOT DRIVE or use heavy machinery until tomorrow (because of the sedation medicines used during the test).    FOLLOW UP: Our staff will call the number listed on your records the next business day following your procedure.  We will call around 7:15- 8:00 am to check on you and address any questions or concerns that you may have regarding the information given to you following your procedure. If we do not reach you, we will leave a message.  If you develop any symptoms (ie: fever, flu-like symptoms, shortness of breath, cough etc.) before then, please call [phone].  If you test positive for Covid 19 in the 2 weeks post procedure, please call and report this information to [place].    If any biopsies were taken you will be contacted by phone or by letter within the next 1-3 weeks.  Please call us at [phone] if you have not heard about the biopsies in 3 weeks.    SIGNATURES/CONFIDENTIALITY: You and/or your care partner have signed paperwork which will be entered into your electronic medical record.  These signatures attest to the fact that that the information above on your After Visit Summary has been reviewed and is understood.  Full responsibility of the confidentiality of this discharge information lies with you and/or your care-partner.

## 2022-05-20 NOTE — Progress Notes (Signed)
History and Physical Interval Note: Seen in the office 04/30/22 - no interval changes. Here for EGD to evaluate dysphagia, possible dilation. I have discussed risks / benefits of EGD and dilation, anesthesia, she wishes to proceed.  05/20/2022 9:51 AM  Molly Hanson  has presented today for endoscopic procedure(s), with the diagnosis of  Encounter Diagnosis  Name Primary?   Dysphagia, unspecified type Yes  .  The various methods of evaluation and treatment have been discussed with the patient and/or family. After consideration of risks, benefits and other options for treatment, the patient has consented to  the endoscopic procedure(s).   The patient's history has been reviewed, patient examined, no change in status, stable for surgery.  I have reviewed the patient's chart and labs.  Questions were answered to the patient's satisfaction.    Jolly Mango, MD [facility]

## 2022-05-21 ENCOUNTER — Telehealth: Payer: Self-pay

## 2022-05-21 NOTE — Telephone Encounter (Signed)
  Follow up Call-     05/20/2022    9:32 AM 12/04/2020    8:09 AM  Call back number  Post procedure Call Back phone  # [phone]  Permission to leave phone message Yes Yes     Patient questions:  Do you have a fever, pain , or abdominal swelling? No. Pain Score  0 *  Have you tolerated food without any problems? Yes.    Have you been able to return to your normal activities? Yes.    Do you have any questions about your discharge instructions: Diet   No. Medications  No. Follow up visit  No.  Do you have questions or concerns about your Care? No.  Actions: * If pain score is 4 or above: No action needed, pain <4.

## 2022-06-02 ENCOUNTER — Telehealth: Payer: Self-pay | Admitting: Family Medicine

## 2022-06-02 NOTE — Telephone Encounter (Signed)
Patient will need a appt to evaluate ears

## 2022-06-02 NOTE — Telephone Encounter (Signed)
PT asking for referral for ENT= ears feel stopped up and whoozy.

## 2022-06-03 ENCOUNTER — Ambulatory Visit (INDEPENDENT_AMBULATORY_CARE_PROVIDER_SITE_OTHER): Payer: Commercial Managed Care - HMO | Admitting: Family Medicine

## 2022-06-03 ENCOUNTER — Encounter: Payer: Self-pay | Admitting: Family Medicine

## 2022-06-03 VITALS — BP 138/82 | HR 82 | Temp 98.1°F | Resp 16 | Wt 205.2 lb

## 2022-06-03 DIAGNOSIS — H9203 Otalgia, bilateral: Secondary | ICD-10-CM | POA: Diagnosis not present

## 2022-06-03 DIAGNOSIS — J309 Allergic rhinitis, unspecified: Secondary | ICD-10-CM | POA: Diagnosis not present

## 2022-06-03 MED ORDER — FLUTICASONE PROPIONATE 50 MCG/ACT NA SUSP: 2.0000 | Freq: Every day | NASAL | 3 refills | Status: DC

## 2022-06-03 MED ORDER — CETIRIZINE HCL 10 MG PO TABS: 10.0000 mg | ORAL_TABLET | Freq: Every day | ORAL | 3 refills | Status: DC

## 2022-06-05 NOTE — Progress Notes (Signed)
New Patient Office Visit  Subjective    Patient ID: Molly Hanson, female    DOB: July 13, 1956  Age: 66 y.o. MRN: [id number]  CC:  Chief Complaint  Patient presents with   Ear Pain    HPI Molly Hanson presents with complaint of ear pain. Patient denies afever/chills or viral sx. Patient denies known trauma or injury. Has not taken meds for sx.    Outpatient Encounter Medications as of 06/03/2022  Medication Sig   albuterol (VENTOLIN HFA) 108 (90 Base) MCG/ACT inhaler Inhale 2 puffs into the lungs every 6 (six) hours as needed for wheezing or shortness of breath.   cetirizine (ZYRTEC) 10 MG tablet Take 1 tablet (10 mg total) by mouth daily.   clotrimazole-betamethasone (LOTRISONE) cream Apply 1 application. topically 2 (two) times daily.   fluticasone (FLONASE) 50 MCG/ACT nasal spray Place 2 sprays into both nostrils daily.   No facility-administered encounter medications on file as of 06/03/2022.    Past Medical History:  Diagnosis Date   Arthritis    knees, arm    Hyperlipidemia    has been elevated in the past - no meds    Vitamin D deficiency     Past Surgical History:  Procedure Laterality Date   COLONOSCOPY     > 10 yrs ago- normal     Family History  Problem Relation Age of Onset   Breast cancer Mother    Diabetes Mother    Diabetes Father    Diabetes Maternal Grandmother    Stroke Maternal Grandmother    Colon polyps Maternal Grandmother    Colon cancer Maternal Grandfather    Diabetes Paternal Grandmother    Colon polyps Maternal Uncle    Esophageal cancer Neg Hx    Rectal cancer Neg Hx    Stomach cancer Neg Hx     Social History   Socioeconomic History   Marital status: Single    Spouse name: Not on file   Number of children: Not on file   Years of education: Not on file   Highest education level: Not on file  Occupational History   Not on file  Tobacco Use   Smoking status: Former    Types: Cigarettes    Quit date: 07/15/2020    Years  since quitting: 1.8    Passive exposure: Past   Smokeless tobacco: Never  Vaping Use   Vaping Use: Never used  Substance and Sexual Activity   Alcohol use: Yes   Drug use: Never   Sexual activity: Not on file  Other Topics Concern   Not on file  Social History Narrative   Not on file   Social Determinants of Health   Financial Resource Strain: Not on file  Food Insecurity: Not on file  Transportation Needs: Not on file  Physical Activity: Not on file  Stress: Not on file  Social Connections: Not on file  Intimate Partner Violence: Not on file    Review of Systems  Constitutional:  Negative for chills and fever.  HENT:  Positive for ear pain. Negative for congestion and ear discharge.   All other systems reviewed and are negative.       Objective    BP 138/82   Pulse 82   Temp 98.1 F (36.7 C) (Oral)   Resp 16   Wt 205 lb 3.2 oz (93.1 kg)   SpO2 93%   BMI 33.12 kg/m   Physical Exam Vitals and nursing note reviewed.  Constitutional:  General: She is not in acute distress. HENT:     Head: Normocephalic and atraumatic.     Right Ear: A middle ear effusion is present.     Left Ear: A middle ear effusion is present.     Nose: Congestion present.  Cardiovascular:     Rate and Rhythm: Normal rate and regular rhythm.  Pulmonary:     Effort: Pulmonary effort is normal.     Breath sounds: Normal breath sounds.  Neurological:     General: No focal deficit present.     Mental Status: She is alert and oriented to person, place, and time.         Assessment & Plan:   1. Allergic rhinitis, unspecified seasonality, unspecified trigger Zyrtec and flonase prescribed.  2. Otalgia of both ears As above. Tylenol/nsaids prn    Return for physical.   Becky Sax, MD

## 2022-06-09 ENCOUNTER — Encounter: Payer: Self-pay | Admitting: Physician Assistant

## 2022-06-09 ENCOUNTER — Ambulatory Visit (INDEPENDENT_AMBULATORY_CARE_PROVIDER_SITE_OTHER): Payer: Commercial Managed Care - HMO | Admitting: Physician Assistant

## 2022-06-09 VITALS — BP 124/80 | HR 86 | Resp 18 | Ht 66.0 in | Wt 258.0 lb

## 2022-06-09 DIAGNOSIS — H938X2 Other specified disorders of left ear: Secondary | ICD-10-CM | POA: Diagnosis not present

## 2022-06-09 NOTE — Patient Instructions (Signed)
I encourage you to start taking the Zyrtec on a daily basis and use the Flonase as directed.  Please let us know if there is anything else we can do for you  Kennieth Rad, PA-C Physician Assistant [place] [email or web address]   Health Maintenance, Female Adopting a healthy lifestyle and getting preventive care are important in promoting health and wellness. Ask your health care provider about: The right schedule for you to have regular tests and exams. Things you can do on your own to prevent diseases and keep yourself healthy. What should I know about diet, weight, and exercise? Eat a healthy diet  Eat a diet that includes plenty of vegetables, fruits, low-fat dairy products, and lean protein. Do not eat a lot of foods that are high in solid fats, added sugars, or sodium. Maintain a healthy weight Body mass index (BMI) is used to identify weight problems. It estimates body fat based on height and weight. Your health care provider can help determine your BMI and help you achieve or maintain a healthy weight. Get regular exercise Get regular exercise. This is one of the most important things you can do for your health. Most adults should: Exercise for at least 150 minutes each week. The exercise should increase your heart rate and make you sweat (moderate-intensity exercise). Do strengthening exercises at least twice a week. This is in addition to the moderate-intensity exercise. Spend less time sitting. Even light physical activity can be beneficial. Watch cholesterol and blood lipids Have your blood tested for lipids and cholesterol at 64 years of age, then have this test every 5 years. Have your cholesterol levels checked more often if: Your lipid or cholesterol levels are high. You are older than 64 years of age. You are at high risk for heart disease. What should I know about cancer screening? Depending on your health  history and family history, you may need to have cancer screening at various ages. This may include screening for: Breast cancer. Cervical cancer. Colorectal cancer. Skin cancer. Lung cancer. What should I know about heart disease, diabetes, and high blood pressure? Blood pressure and heart disease High blood pressure causes heart disease and increases the risk of stroke. This is more likely to develop in people who have high blood pressure readings or are overweight. Have your blood pressure checked: Every 3-5 years if you are 67-48 years of age. Every year if you are 30 years old or older. Diabetes Have regular diabetes screenings. This checks your fasting blood sugar level. Have the screening done: Once every three years after age 38 if you are at a normal weight and have a low risk for diabetes. More often and at a younger age if you are overweight or have a high risk for diabetes. What should I know about preventing infection? Hepatitis B If you have a higher risk for hepatitis B, you should be screened for this virus. Talk with your health care provider to find out if you are at risk for hepatitis B infection. Hepatitis C Testing is recommended for: Everyone born from [birth year] through 1963. Anyone with known risk factors for hepatitis C. Sexually transmitted infections (STIs) Get screened for STIs, including gonorrhea and chlamydia, if: You are sexually active and are younger than 64 years of age. You are older than 64 years of age and your health care provider tells you that you are at risk for this type of infection. Your sexual activity has changed since you were last screened,  and you are at increased risk for chlamydia or gonorrhea. Ask your health care provider if you are at risk. Ask your health care provider about whether you are at high risk for HIV. Your health care provider may recommend a prescription medicine to help prevent HIV infection. If you choose to take medicine to  prevent HIV, you should first get tested for HIV. You should then be tested every 3 months for as long as you are taking the medicine. Pregnancy If you are about to stop having your period (premenopausal) and you may become pregnant, seek counseling before you get pregnant. Take 400 to 800 micrograms (mcg) of folic acid every day if you become pregnant. Ask for birth control (contraception) if you want to prevent pregnancy. Osteoporosis and menopause Osteoporosis is a disease in which the bones lose minerals and strength with aging. This can result in bone fractures. If you are 51 years old or older, or if you are at risk for osteoporosis and fractures, ask your health care provider if you should: Be screened for bone loss. Take a calcium or vitamin D supplement to lower your risk of fractures. Be given hormone replacement therapy (HRT) to treat symptoms of menopause. Follow these instructions at home: Alcohol use Do not drink alcohol if: Your health care provider tells you not to drink. You are pregnant, may be pregnant, or are planning to become pregnant. If you drink alcohol: Limit how much you have to: 0-1 drink a day. Know how much alcohol is in your drink. In the [place] one drink equals one 12 oz bottle of beer (355 mL), one 5 oz glass of wine (148 mL), or one 1 oz glass of hard liquor (44 mL). Lifestyle Do not use any products that contain nicotine or tobacco. These products include cigarettes, chewing tobacco, and vaping devices, such as e-cigarettes. If you need help quitting, ask your health care provider. Do not use street drugs. Do not share needles. Ask your health care provider for help if you need support or information about quitting drugs. General instructions Schedule regular health, dental, and eye exams. Stay current with your vaccines. Tell your health care provider if: You often feel depressed. You have ever been abused or do not feel safe at  home. Summary Adopting a healthy lifestyle and getting preventive care are important in promoting health and wellness. Follow your health care provider's instructions about healthy diet, exercising, and getting tested or screened for diseases. Follow your health care provider's instructions on monitoring your cholesterol and blood pressure. This information is not intended to replace advice given to you by your health care provider. Make sure you discuss any questions you have with your health care provider. Document Revised: 03/31/2021 Document Reviewed: 03/31/2021 Elsevier Patient Education  [place].

## 2022-06-09 NOTE — Progress Notes (Unsigned)
   Established Patient Office Visit  Subjective   Patient ID: Molly Hanson, female    DOB: 05-10-1956  Age: 66 y.o. MRN: [id number]  No chief complaint on file.   States that she continues to have a feeling of ear fullness.  States that she was seen by her primary care provider last week and was given a prescription of Zyrtec and Flonase.  Has     Past Medical History:  Diagnosis Date   Arthritis    knees, arm    Hyperlipidemia    has been elevated in the past - no meds    Vitamin D deficiency    Social History   Socioeconomic History   Marital status: Single    Spouse name: Not on file   Number of children: Not on file   Years of education: Not on file   Highest education level: Not on file  Occupational History   Not on file  Tobacco Use   Smoking status: Former    Types: Cigarettes    Quit date: 07/15/2020    Years since quitting: 1.9    Passive exposure: Past   Smokeless tobacco: Never  Vaping Use   Vaping Use: Never used  Substance and Sexual Activity   Alcohol use: Yes   Drug use: Never   Sexual activity: Not on file  Other Topics Concern   Not on file  Social History Narrative   Not on file   Social Determinants of Health   Financial Resource Strain: Not on file  Food Insecurity: Not on file  Transportation Needs: Not on file  Physical Activity: Not on file  Stress: Not on file  Social Connections: Not on file  Intimate Partner Violence: Not on file   Family History  Problem Relation Age of Onset   Breast cancer Mother    Diabetes Mother    Diabetes Father    Diabetes Maternal Grandmother    Stroke Maternal Grandmother    Colon polyps Maternal Grandmother    Colon cancer Maternal Grandfather    Diabetes Paternal Grandmother    Colon polyps Maternal Uncle    Esophageal cancer Neg Hx    Rectal cancer Neg Hx    Stomach cancer Neg Hx    No Known Allergies  ROS    Objective:     There were no vitals taken for this  visit.   Physical Exam     Assessment & Plan:   Problem List Items Addressed This Visit   None   No follow-ups on file.    Loraine Grip Mayers, PA-C

## 2022-06-10 ENCOUNTER — Encounter: Payer: Self-pay | Admitting: Physician Assistant

## 2022-08-04 ENCOUNTER — Encounter: Payer: Self-pay | Admitting: Family Medicine

## 2022-08-04 ENCOUNTER — Ambulatory Visit (INDEPENDENT_AMBULATORY_CARE_PROVIDER_SITE_OTHER): Payer: Commercial Managed Care - HMO | Admitting: Family Medicine

## 2022-08-04 VITALS — BP 125/79 | HR 71 | Temp 97.7°F | Resp 16 | Ht 64.0 in | Wt 256.4 lb

## 2022-08-04 DIAGNOSIS — Z23 Encounter for immunization: Secondary | ICD-10-CM | POA: Diagnosis not present

## 2022-08-04 DIAGNOSIS — Z130 Encounter for screening for diseases of the blood and blood-forming organs and certain disorders involving the immune mechanism: Secondary | ICD-10-CM

## 2022-08-04 DIAGNOSIS — Z1159 Encounter for screening for other viral diseases: Secondary | ICD-10-CM

## 2022-08-04 DIAGNOSIS — Z13220 Encounter for screening for lipoid disorders: Secondary | ICD-10-CM | POA: Diagnosis not present

## 2022-08-04 DIAGNOSIS — Z0000 Encounter for general adult medical examination without abnormal findings: Secondary | ICD-10-CM

## 2022-08-04 DIAGNOSIS — Z1329 Encounter for screening for other suspected endocrine disorder: Secondary | ICD-10-CM

## 2022-08-04 DIAGNOSIS — Z13228 Encounter for screening for other metabolic disorders: Secondary | ICD-10-CM

## 2022-08-04 NOTE — Progress Notes (Unsigned)
Patient is here for complete physical examination. Patient has no concerns today for provider.

## 2022-08-05 ENCOUNTER — Encounter: Payer: Self-pay | Admitting: Family Medicine

## 2022-08-05 ENCOUNTER — Other Ambulatory Visit: Payer: Self-pay | Admitting: Family Medicine

## 2022-08-05 LAB — LIPID PANEL
Chol/HDL Ratio: 3.8 ratio (ref 0.0–4.4)
Cholesterol, Total: 196 mg/dL (ref 100–199)
HDL: 51 mg/dL (ref >39)
LDL Chol Calc (NIH): 127 mg/dL — ABNORMAL HIGH (ref 0–99)
Triglycerides: 102 mg/dL (ref 0–149)
VLDL Cholesterol Cal: 18 mg/dL (ref 5–40)

## 2022-08-05 LAB — CMP14+EGFR
ALT: 19 IU/L (ref 0–32)
AST: 20 IU/L (ref 0–40)
Albumin/Globulin Ratio: 1.6 (ref 1.2–2.2)
Albumin: 4.5 g/dL (ref 3.9–4.9)
Alkaline Phosphatase: 80 IU/L (ref 44–121)
BUN/Creatinine Ratio: 12 (ref 12–28)
BUN: 12 mg/dL (ref 8–27)
Bilirubin Total: 0.4 mg/dL (ref 0.0–1.2)
CO2: 24 mmol/L (ref 20–29)
Calcium: 10.2 mg/dL (ref 8.7–10.3)
Chloride: 101 mmol/L (ref 96–106)
Creatinine, Ser: 0.99 mg/dL (ref 0.57–1.00)
Globulin, Total: 2.8 g/dL (ref 1.5–4.5)
Glucose: 117 mg/dL — ABNORMAL HIGH (ref 70–99)
Potassium: 4.7 mmol/L (ref 3.5–5.2)
Sodium: 141 mmol/L (ref 134–144)
Total Protein: 7.3 g/dL (ref 6.0–8.5)
eGFR: 64 mL/min/{1.73_m2} (ref >59)

## 2022-08-05 LAB — HEMOGLOBIN A1C
Est. average glucose Bld gHb Est-mCnc: 146 mg/dL
Hgb A1c MFr Bld: 6.7 % — ABNORMAL HIGH (ref 4.8–5.6)

## 2022-08-05 LAB — CBC WITH DIFFERENTIAL/PLATELET
Basophils Absolute: 0 10*3/uL (ref 0.0–0.2)
Basos: 0 %
EOS (ABSOLUTE): 0.1 10*3/uL (ref 0.0–0.4)
Eos: 2 %
Hematocrit: 44.9 % (ref 34.0–46.6)
Hemoglobin: 14.9 g/dL (ref 11.1–15.9)
Immature Grans (Abs): 0 10*3/uL (ref 0.0–0.1)
Immature Granulocytes: 0 %
Lymphocytes Absolute: 2 10*3/uL (ref 0.7–3.1)
Lymphs: 27 %
MCH: 28.7 pg (ref 26.6–33.0)
MCHC: 33.2 g/dL (ref 31.5–35.7)
MCV: 86 fL (ref 79–97)
Monocytes Absolute: 0.5 10*3/uL (ref 0.1–0.9)
Monocytes: 7 %
Neutrophils Absolute: 4.9 10*3/uL (ref 1.4–7.0)
Neutrophils: 64 %
Platelets: 287 10*3/uL (ref 150–450)
RBC: 5.2 x10E6/uL (ref 3.77–5.28)
RDW: 12.6 % (ref 11.7–15.4)
WBC: 7.6 10*3/uL (ref 3.4–10.8)

## 2022-08-05 LAB — TSH: TSH: 1.15 u[IU]/mL (ref 0.450–4.500)

## 2022-08-05 LAB — VITAMIN D 25 HYDROXY (VIT D DEFICIENCY, FRACTURES): Vit D, 25-Hydroxy: 28.5 ng/mL — ABNORMAL LOW (ref 30.0–100.0)

## 2022-08-05 LAB — HEPATITIS C ANTIBODY: Hep C Virus Ab: NONREACTIVE

## 2022-08-05 MED ORDER — VITAMIN D (ERGOCALCIFEROL) 1.25 MG (50000 UNIT) PO CAPS: 50000.0000 [IU] | ORAL_CAPSULE | ORAL | 0 refills | Status: DC

## 2022-08-05 NOTE — Progress Notes (Signed)
Established Patient Office Visit  Subjective    Patient ID: Molly Hanson, female    DOB: [birth year]  Age: 66 y.o. MRN: [id number]  CC:  Chief Complaint  Patient presents with   Annual Exam    HPI Molly Hanson presents for routine annual exam. Patient denies acute complaints or concerns.    Outpatient Encounter Medications as of 08/04/2022  Medication Sig   albuterol (VENTOLIN HFA) 108 (90 Base) MCG/ACT inhaler Inhale 2 puffs into the lungs every 6 (six) hours as needed for wheezing or shortness of breath. (Patient not taking: Reported on 08/04/2022)   cetirizine (ZYRTEC) 10 MG tablet Take 1 tablet (10 mg total) by mouth daily. (Patient not taking: Reported on 06/09/2022)   clotrimazole-betamethasone (LOTRISONE) cream Apply 1 application. topically 2 (two) times daily. (Patient not taking: Reported on 06/09/2022)   fluticasone (FLONASE) 50 MCG/ACT nasal spray Place 2 sprays into both nostrils daily. (Patient not taking: Reported on 06/09/2022)   No facility-administered encounter medications on file as of 08/04/2022.    Past Medical History:  Diagnosis Date   Arthritis    knees, arm    Hyperlipidemia    has been elevated in the past - no meds    Vitamin D deficiency     Past Surgical History:  Procedure Laterality Date   COLONOSCOPY     > 10 yrs ago- normal     Family History  Problem Relation Age of Onset   Breast cancer Mother    Diabetes Mother    Diabetes Father    Diabetes Maternal Grandmother    Stroke Maternal Grandmother    Colon polyps Maternal Grandmother    Colon cancer Maternal Grandfather    Diabetes Paternal Grandmother    Colon polyps Maternal Uncle    Esophageal cancer Neg Hx    Rectal cancer Neg Hx    Stomach cancer Neg Hx     Social History   Socioeconomic History   Marital status: Single    Spouse name: Not on file   Number of children: Not on file   Years of education: Not on file   Highest education level: Not on file   Occupational History   Not on file  Tobacco Use   Smoking status: Former    Types: Cigarettes    Quit date: 07/15/2020    Years since quitting: 2.0    Passive exposure: Past   Smokeless tobacco: Never  Vaping Use   Vaping Use: Never used  Substance and Sexual Activity   Alcohol use: Yes   Drug use: Never   Sexual activity: Not on file  Other Topics Concern   Not on file  Social History Narrative   Not on file   Social Determinants of Health   Financial Resource Strain: Not on file  Food Insecurity: Not on file  Transportation Needs: Not on file  Physical Activity: Not on file  Stress: Not on file  Social Connections: Not on file  Intimate Partner Violence: Not on file    Review of Systems  All other systems reviewed and are negative.       Objective    BP 125/79   Pulse 71   Temp 97.7 F (36.5 C) (Oral)   Resp 16   Ht 5' 4" (1.626 m)   Wt 256 lb 6.4 oz (116.3 kg)   SpO2 92%   BMI 44.01 kg/m   Physical Exam Vitals and nursing note reviewed.  Constitutional:  General: She is not in acute distress. HENT:     Head: Normocephalic and atraumatic.     Right Ear: Tympanic membrane, ear canal and external ear normal.     Left Ear: Tympanic membrane, ear canal and external ear normal.     Nose: Nose normal.     Mouth/Throat:     Mouth: Mucous membranes are moist.     Pharynx: Oropharynx is clear.  Eyes:     Conjunctiva/sclera: Conjunctivae normal.     Pupils: Pupils are equal, round, and reactive to light.  Neck:     Thyroid: No thyromegaly.  Cardiovascular:     Rate and Rhythm: Normal rate and regular rhythm.     Heart sounds: Normal heart sounds. No murmur heard. Pulmonary:     Effort: Pulmonary effort is normal. No respiratory distress.     Breath sounds: Normal breath sounds.  Abdominal:     General: There is no distension.     Palpations: Abdomen is soft. There is no mass.     Tenderness: There is no abdominal tenderness.  Musculoskeletal:         General: Normal range of motion.     Cervical back: Normal range of motion and neck supple.  Skin:    General: Skin is warm and dry.  Neurological:     General: No focal deficit present.     Mental Status: She is alert and oriented to person, place, and time.  Psychiatric:        Mood and Affect: Mood normal.        Behavior: Behavior normal.         Assessment & Plan:   1. Annual physical exam  - CMP14+EGFR  2. Screening for deficiency anemia  - CBC with Differential  3. Screening for lipid disorders  - Lipid Panel  4. Screening for endocrine/metabolic/immunity disorders  - TSH - Hemoglobin A1c - Vitamin D, 25-hydroxy  5. Need for hepatitis C screening test  - Hepatitis C Antibody  6. Need for prophylactic vaccination and inoculation against influenza  - Flu Vaccine QUAD 6mo+IM (Fluarix, Fluzone & Alfiuria Quad PF)    No follow-ups on file.   Wilson, Amelia P, MD   

## 2022-08-18 ENCOUNTER — Other Ambulatory Visit: Payer: Self-pay | Admitting: Family Medicine

## 2022-08-18 DIAGNOSIS — Z1231 Encounter for screening mammogram for malignant neoplasm of breast: Secondary | ICD-10-CM

## 2022-08-19 ENCOUNTER — Telehealth: Payer: Self-pay | Admitting: Family Medicine

## 2022-08-19 NOTE — Telephone Encounter (Signed)
Pt asking to get a referral from her pcp for PODIATRY. States PCP told her she'd set up an appt but she hasn't heard anything back yet.

## 2022-08-20 ENCOUNTER — Ambulatory Visit (INDEPENDENT_AMBULATORY_CARE_PROVIDER_SITE_OTHER): Payer: Commercial Managed Care - HMO | Admitting: Family Medicine

## 2022-08-20 ENCOUNTER — Encounter: Payer: Self-pay | Admitting: Family Medicine

## 2022-08-20 VITALS — BP 134/82 | HR 68 | Temp 98.2°F | Resp 16 | Wt 257.8 lb

## 2022-08-20 DIAGNOSIS — M7918 Myalgia, other site: Secondary | ICD-10-CM

## 2022-08-20 DIAGNOSIS — L989 Disorder of the skin and subcutaneous tissue, unspecified: Secondary | ICD-10-CM | POA: Diagnosis not present

## 2022-08-20 MED ORDER — CYCLOBENZAPRINE HCL 5 MG PO TABS: 5.0000 mg | ORAL_TABLET | Freq: Three times a day (TID) | ORAL | 1 refills | Status: DC | PRN

## 2022-08-20 NOTE — Progress Notes (Signed)
Patient is her with c/o left side back pain near middle area. Patient said it has been present x 2 weeks with no relief.  Patient would like a referral for podiatrist.

## 2022-08-21 ENCOUNTER — Encounter: Payer: Self-pay | Admitting: Family Medicine

## 2022-08-21 NOTE — Progress Notes (Signed)
Established Patient Office Visit  Subjective    Patient ID: Molly Hanson, female    DOB: 11-06-1956  Age: 66 y.o. MRN: [id number]  CC: No chief complaint on file.   HPI Molly Hanson presents with complaint of foot pain and abdominal pain. Patient reports that it is most likely is muscular and denies known trauma or injury.    Outpatient Encounter Medications as of 08/20/2022  Medication Sig   cyclobenzaprine (FLEXERIL) 5 MG tablet Take 1 tablet (5 mg total) by mouth 3 (three) times daily as needed for muscle spasms.   albuterol (VENTOLIN HFA) 108 (90 Base) MCG/ACT inhaler Inhale 2 puffs into the lungs every 6 (six) hours as needed for wheezing or shortness of breath. (Patient not taking: Reported on 08/04/2022)   cetirizine (ZYRTEC) 10 MG tablet Take 1 tablet (10 mg total) by mouth daily. (Patient not taking: Reported on 06/09/2022)   clotrimazole-betamethasone (LOTRISONE) cream Apply 1 application. topically 2 (two) times daily. (Patient not taking: Reported on 06/09/2022)   fluticasone (FLONASE) 50 MCG/ACT nasal spray Place 2 sprays into both nostrils daily. (Patient not taking: Reported on 06/09/2022)   Vitamin D, Ergocalciferol, (DRISDOL) 1.25 MG (50000 UNIT) CAPS capsule Take 1 capsule (50,000 Units total) by mouth every 7 (seven) days.   No facility-administered encounter medications on file as of 08/20/2022.    Past Medical History:  Diagnosis Date   Arthritis    knees, arm    Hyperlipidemia    has been elevated in the past - no meds    Vitamin D deficiency     Past Surgical History:  Procedure Laterality Date   COLONOSCOPY     > 10 yrs ago- normal     Family History  Problem Relation Age of Onset   Breast cancer Mother    Diabetes Mother    Diabetes Father    Diabetes Maternal Grandmother    Stroke Maternal Grandmother    Colon polyps Maternal Grandmother    Colon cancer Maternal Grandfather    Diabetes Paternal Grandmother    Colon polyps Maternal Uncle     Esophageal cancer Neg Hx    Rectal cancer Neg Hx    Stomach cancer Neg Hx     Social History   Socioeconomic History   Marital status: Single    Spouse name: Not on file   Number of children: Not on file   Years of education: Not on file   Highest education level: Not on file  Occupational History   Not on file  Tobacco Use   Smoking status: Former    Types: Cigarettes    Quit date: 07/15/2020    Years since quitting: 2.1    Passive exposure: Past   Smokeless tobacco: Never  Vaping Use   Vaping Use: Never used  Substance and Sexual Activity   Alcohol use: Yes   Drug use: Never   Sexual activity: Not on file  Other Topics Concern   Not on file  Social History Narrative   Not on file   Social Determinants of Health   Financial Resource Strain: Not on file  Food Insecurity: Not on file  Transportation Needs: Not on file  Physical Activity: Not on file  Stress: Not on file  Social Connections: Not on file  Intimate Partner Violence: Not on file    Review of Systems  All other systems reviewed and are negative.       Objective    BP 134/82  Pulse 68   Temp 98.2 F (36.8 C) (Oral)   Resp 16   Wt 257 lb 12.8 oz (116.9 kg)   SpO2 93%   BMI 44.25 kg/m   Physical Exam Vitals and nursing note reviewed.  Constitutional:      General: She is not in acute distress. Cardiovascular:     Rate and Rhythm: Normal rate and regular rhythm.  Pulmonary:     Effort: Pulmonary effort is normal.     Breath sounds: Normal breath sounds.  Abdominal:     General: There is no distension.     Palpations: Abdomen is soft. There is no mass.     Tenderness: There is abdominal tenderness.     Comments: Reproducible left sided abdominal muscular tenderness   Musculoskeletal:     Left foot: Deformity and tenderness present.     Comments: Left foot with bony prominence in achilles area  Neurological:     General: No focal deficit present.     Mental Status: She is alert  and oriented to person, place, and time.         Assessment & Plan:   1. Foot lesion Referral to consultant for further eval/mgt - Ambulatory referral to Podiatry  2. Abdominal muscle pain Flexeril prescribed. monitor    Return in about 2 weeks (around 09/03/2022).   Becky Sax, MD

## 2022-08-24 ENCOUNTER — Ambulatory Visit: Payer: Commercial Managed Care - HMO | Admitting: Podiatry

## 2022-08-24 ENCOUNTER — Ambulatory Visit: Payer: Commercial Managed Care - HMO

## 2022-08-24 ENCOUNTER — Encounter: Payer: Self-pay | Admitting: Podiatry

## 2022-08-24 DIAGNOSIS — R52 Pain, unspecified: Secondary | ICD-10-CM

## 2022-08-24 DIAGNOSIS — M7662 Achilles tendinitis, left leg: Secondary | ICD-10-CM | POA: Diagnosis not present

## 2022-08-24 MED ORDER — TRIAMCINOLONE ACETONIDE 10 MG/ML IJ SUSP
10.0000 mg | Freq: Once | INTRAMUSCULAR | Status: AC
  Administered 2022-08-24: 10 mg

## 2022-08-24 NOTE — Patient Instructions (Signed)

## 2022-08-25 NOTE — Progress Notes (Signed)
Subjective:   Patient ID: Molly Hanson, female   DOB: 66 y.o.   MRN: [id number]   HPI Patient presents stating that she has had a lot of pain in the back of her left heel.  States its been there for a fairly good period of time but is worsened over the last few months and is hard for her to wear shoe gear currently she is trying to soak the foot in the past and it is messing with her ability to be active or gait problem.  Patient does not smoke likes to be active   Review of Systems  All other systems reviewed and are negative.       Objective:  Physical Exam Vitals and nursing note reviewed.  Constitutional:      Appearance: She is well-developed.  Pulmonary:     Effort: Pulmonary effort is normal.  Musculoskeletal:        General: Normal range of motion.  Skin:    General: Skin is warm.  Neurological:     Mental Status: She is alert.     Neurovascular status found to be intact muscle strength found to be adequate range of motion within normal limits.  Patient is noted to have inflammation pain of the posterior lateral aspect of the left heel with fluid around the area no central or medial involvement with moderate enlargement around the area consistent with probable spurring.  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  She desperately wants to avoid surgery which I completely agree with and at this point I explained injection and the possibility for rupture associated with it and she is willing to accept risk.  I carefully did a steroid injection 3 mg dexamethasone Kenalog 5 mg Xylocaine to the lateral side of the posterior heel at insertion calcaneus with no central or medial involvement and applied air fracture walker to completely immobilize and advised on ice therapy  and will be seen back 4 weeks  X-rays indicate that there is quite a bit of spurring to the posterior aspect of the left heel with no indications that any has broken off but it is probably embedded into the tendon

## 2022-09-02 ENCOUNTER — Ambulatory Visit (INDEPENDENT_AMBULATORY_CARE_PROVIDER_SITE_OTHER): Payer: Commercial Managed Care - HMO

## 2022-09-02 ENCOUNTER — Ambulatory Visit (INDEPENDENT_AMBULATORY_CARE_PROVIDER_SITE_OTHER): Payer: Commercial Managed Care - HMO | Admitting: Family Medicine

## 2022-09-02 ENCOUNTER — Encounter: Payer: Self-pay | Admitting: Family Medicine

## 2022-09-02 VITALS — BP 135/72 | HR 74 | Temp 97.7°F | Resp 16 | Wt 257.0 lb

## 2022-09-02 DIAGNOSIS — R1012 Left upper quadrant pain: Secondary | ICD-10-CM

## 2022-09-07 ENCOUNTER — Encounter: Payer: Self-pay | Admitting: Family Medicine

## 2022-09-07 NOTE — Progress Notes (Signed)
Established Patient Office Visit  Subjective    Patient ID: Molly Hanson, female    DOB: [birth year]  Age: 66 y.o. MRN: [id number]  CC: No chief complaint on file.   HPI Molly Hanson presents for follow up of left sided abdominal pain. Patient reports that sx persist.    Outpatient Encounter Medications as of 09/02/2022  Medication Sig   albuterol (VENTOLIN HFA) 108 (90 Base) MCG/ACT inhaler Inhale 2 puffs into the lungs every 6 (six) hours as needed for wheezing or shortness of breath. (Patient not taking: Reported on 08/04/2022)   cetirizine (ZYRTEC) 10 MG tablet Take 1 tablet (10 mg total) by mouth daily. (Patient not taking: Reported on 06/09/2022)   clotrimazole-betamethasone (LOTRISONE) cream Apply 1 application. topically 2 (two) times daily. (Patient not taking: Reported on 06/09/2022)   cyclobenzaprine (FLEXERIL) 5 MG tablet Take 1 tablet (5 mg total) by mouth 3 (three) times daily as needed for muscle spasms.   fluticasone (FLONASE) 50 MCG/ACT nasal spray Place 2 sprays into both nostrils daily. (Patient not taking: Reported on 06/09/2022)   Vitamin D, Ergocalciferol, (DRISDOL) 1.25 MG (50000 UNIT) CAPS capsule Take 1 capsule (50,000 Units total) by mouth every 7 (seven) days.   No facility-administered encounter medications on file as of 09/02/2022.    Past Medical History:  Diagnosis Date   Arthritis    knees, arm    Hyperlipidemia    has been elevated in the past - no meds    Vitamin D deficiency     Past Surgical History:  Procedure Laterality Date   COLONOSCOPY     > 10 yrs ago- normal     Family History  Problem Relation Age of Onset   Breast cancer Mother    Diabetes Mother    Diabetes Father    Diabetes Maternal Grandmother    Stroke Maternal Grandmother    Colon polyps Maternal Grandmother    Colon cancer Maternal Grandfather    Diabetes Paternal Grandmother    Colon polyps Maternal Uncle    Esophageal cancer Neg Hx    Rectal cancer Neg Hx     Stomach cancer Neg Hx     Social History   Socioeconomic History   Marital status: Single    Spouse name: Not on file   Number of children: Not on file   Years of education: Not on file   Highest education level: Not on file  Occupational History   Not on file  Tobacco Use   Smoking status: Former    Types: Cigarettes    Quit date: 07/15/2020    Years since quitting: 2.1    Passive exposure: Past   Smokeless tobacco: Never  Vaping Use   Vaping Use: Never used  Substance and Sexual Activity   Alcohol use: Yes   Drug use: Never   Sexual activity: Not on file  Other Topics Concern   Not on file  Social History Narrative   Not on file   Social Determinants of Health   Financial Resource Strain: Not on file  Food Insecurity: Not on file  Transportation Needs: Not on file  Physical Activity: Not on file  Stress: Not on file  Social Connections: Not on file  Intimate Partner Violence: Not on file    Review of Systems  Gastrointestinal:  Positive for abdominal pain. Negative for blood in stool, constipation, diarrhea, nausea and vomiting.  All other systems reviewed and are negative.       Objective  BP 135/72   Pulse 74   Temp 97.7 F (36.5 C) (Oral)   Resp 16   Wt 257 lb (116.6 kg)   SpO2 94%   BMI 44.11 kg/m   Physical Exam Vitals and nursing note reviewed.  Constitutional:      General: She is not in acute distress. Cardiovascular:     Rate and Rhythm: Normal rate and regular rhythm.  Pulmonary:     Effort: Pulmonary effort is normal.     Breath sounds: Normal breath sounds.  Abdominal:     General: There is no distension.     Palpations: Abdomen is soft. There is no mass.     Tenderness: There is abdominal tenderness.     Comments: Reproducible left sided abdominal muscular tenderness   Neurological:     General: No focal deficit present.     Mental Status: She is alert and oriented to person, place, and time.         Assessment &  Plan:   1. Left upper quadrant abdominal pain Patient referred for abdominal film - DG Abd 1 View; Future    No follow-ups on file.   Becky Sax, MD

## 2022-09-14 ENCOUNTER — Ambulatory Visit: Payer: Commercial Managed Care - HMO | Admitting: Family Medicine

## 2022-09-16 ENCOUNTER — Ambulatory Visit
Admission: RE | Admit: 2022-09-16 | Discharge: 2022-09-16 | Disposition: A | Discharge status: Home / Self Care | Payer: Commercial Managed Care - HMO | Source: Ambulatory Visit | Attending: Family Medicine | Admitting: Family Medicine

## 2022-09-16 DIAGNOSIS — Z1231 Encounter for screening mammogram for malignant neoplasm of breast: Secondary | ICD-10-CM

## 2022-09-21 ENCOUNTER — Encounter: Payer: Self-pay | Admitting: Podiatry

## 2022-09-21 ENCOUNTER — Ambulatory Visit: Payer: Commercial Managed Care - HMO | Admitting: Podiatry

## 2022-09-21 DIAGNOSIS — M7662 Achilles tendinitis, left leg: Secondary | ICD-10-CM | POA: Diagnosis not present

## 2022-09-22 ENCOUNTER — Encounter: Payer: Self-pay | Admitting: Family Medicine

## 2022-09-22 ENCOUNTER — Ambulatory Visit (INDEPENDENT_AMBULATORY_CARE_PROVIDER_SITE_OTHER): Payer: Commercial Managed Care - HMO | Admitting: Family Medicine

## 2022-09-22 VITALS — BP 121/77 | HR 78 | Temp 98.1°F | Resp 16 | Wt 262.2 lb

## 2022-09-22 DIAGNOSIS — R7303 Prediabetes: Secondary | ICD-10-CM | POA: Diagnosis not present

## 2022-09-22 DIAGNOSIS — Z6841 Body Mass Index (BMI) 40.0 and over, adult: Secondary | ICD-10-CM | POA: Diagnosis not present

## 2022-09-22 NOTE — Progress Notes (Signed)
Subjective:   Patient ID: Molly Hanson, female   DOB: 66 y.o.   MRN: [id number]   HPI Patient states that she is currently feeling a lot better with the back of her left heel and is only having mild discomfort   ROS      Objective:  Physical Exam  Neuro vascular status intact discomfort posterior aspect left heel still present but improved with boot being of great benefit to her     Assessment:  Tendinitis left improved but present     Plan:  Reviewed the continuation of immobilization stretching activity and heel lift with gradual reduction of the boot over the next 2 to 4 weeks.  Patient is discharged will be seen back as needed

## 2022-09-23 ENCOUNTER — Encounter: Payer: Self-pay | Admitting: Family Medicine

## 2022-09-23 NOTE — Progress Notes (Signed)
Established Patient Office Visit  Subjective    Patient ID: Molly Hanson, female    DOB: [birth year]  Age: 66 y.o. MRN: [id number]  CC:  Chief Complaint  Patient presents with   Gynecologic Exam    HPI Molly Hanson presents for follow up of labs. Patient denies acute complaints.   Outpatient Encounter Medications as of 09/22/2022  Medication Sig   cyclobenzaprine (FLEXERIL) 5 MG tablet Take 1 tablet (5 mg total) by mouth 3 (three) times daily as needed for muscle spasms.   Vitamin D, Ergocalciferol, (DRISDOL) 1.25 MG (50000 UNIT) CAPS capsule Take 1 capsule (50,000 Units total) by mouth every 7 (seven) days.   albuterol (VENTOLIN HFA) 108 (90 Base) MCG/ACT inhaler Inhale 2 puffs into the lungs every 6 (six) hours as needed for wheezing or shortness of breath. (Patient not taking: Reported on 08/04/2022)   cetirizine (ZYRTEC) 10 MG tablet Take 1 tablet (10 mg total) by mouth daily. (Patient not taking: Reported on 06/09/2022)   clotrimazole-betamethasone (LOTRISONE) cream Apply 1 application. topically 2 (two) times daily. (Patient not taking: Reported on 06/09/2022)   fluticasone (FLONASE) 50 MCG/ACT nasal spray Place 2 sprays into both nostrils daily. (Patient not taking: Reported on 06/09/2022)   No facility-administered encounter medications on file as of 09/22/2022.    Past Medical History:  Diagnosis Date   Arthritis    knees, arm    Hyperlipidemia    has been elevated in the past - no meds    Vitamin D deficiency     Past Surgical History:  Procedure Laterality Date   COLONOSCOPY     > 10 yrs ago- normal     Family History  Problem Relation Age of Onset   Breast cancer Mother    Diabetes Mother    Diabetes Father    Diabetes Maternal Grandmother    Stroke Maternal Grandmother    Colon polyps Maternal Grandmother    Colon cancer Maternal Grandfather    Diabetes Paternal Grandmother    Colon polyps Maternal Uncle    Esophageal cancer Neg Hx    Rectal  cancer Neg Hx    Stomach cancer Neg Hx     Social History   Socioeconomic History   Marital status: Single    Spouse name: Not on file   Number of children: Not on file   Years of education: Not on file   Highest education level: Not on file  Occupational History   Not on file  Tobacco Use   Smoking status: Former    Types: Cigarettes    Quit date: 07/15/2020    Years since quitting: 2.1    Passive exposure: Past   Smokeless tobacco: Never  Vaping Use   Vaping Use: Never used  Substance and Sexual Activity   Alcohol use: Yes   Drug use: Never   Sexual activity: Not on file  Other Topics Concern   Not on file  Social History Narrative   Not on file   Social Determinants of Health   Financial Resource Strain: Not on file  Food Insecurity: Not on file  Transportation Needs: Not on file  Physical Activity: Not on file  Stress: Not on file  Social Connections: Not on file  Intimate Partner Violence: Not on file    Review of Systems  All other systems reviewed and are negative.       Objective    BP 121/77   Pulse 78   Temp 98.1 F (36.7  C) (Oral)   Resp 16   Wt 262 lb 3.2 oz (118.9 kg)   SpO2 93%   BMI 45.01 kg/m   Physical Exam Vitals and nursing note reviewed.  Constitutional:      General: She is not in acute distress.    Appearance: She is obese.  Cardiovascular:     Rate and Rhythm: Normal rate and regular rhythm.  Pulmonary:     Effort: Pulmonary effort is normal.     Breath sounds: Normal breath sounds.  Neurological:     General: No focal deficit present.     Mental Status: She is alert and oriented to person, place, and time.         Assessment & Plan:   1. Prediabetes Patient defers starting meds at this time. Discussed dietary and activity options in detail. Will monitor  2. Class 3 severe obesity due to excess calories without serious comorbidity with body mass index (BMI) of 45.0 to 49.9 in adult [facility]) As above. Goal is  2-4lbs/mo wt loss.     Return in about 3 months (around 12/23/2022) for follow up.   Becky Sax, MD

## 2022-10-21 ENCOUNTER — Other Ambulatory Visit: Payer: Self-pay | Admitting: Family Medicine

## 2022-11-19 IMAGING — CR DG KNEE COMPLETE 4+V*L*
4 series · 4 of 4 positions shown · non-contrast
Comparison: 08/31/2013

CLINICAL DATA: Chronic left knee pain.

EXAM:
LEFT KNEE - COMPLETE 4+ VIEW

[t knee ap left]
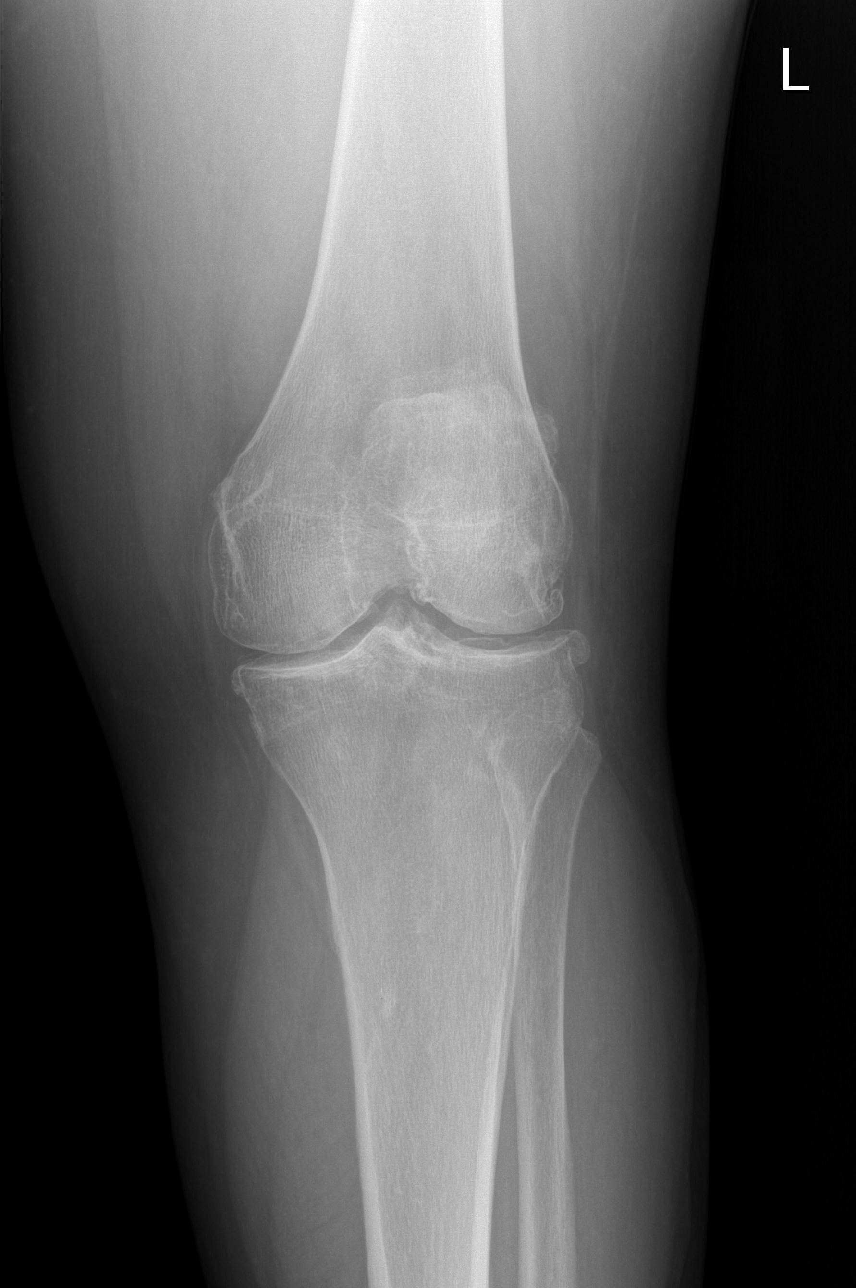

[t knee oblique left (1 of 2)]
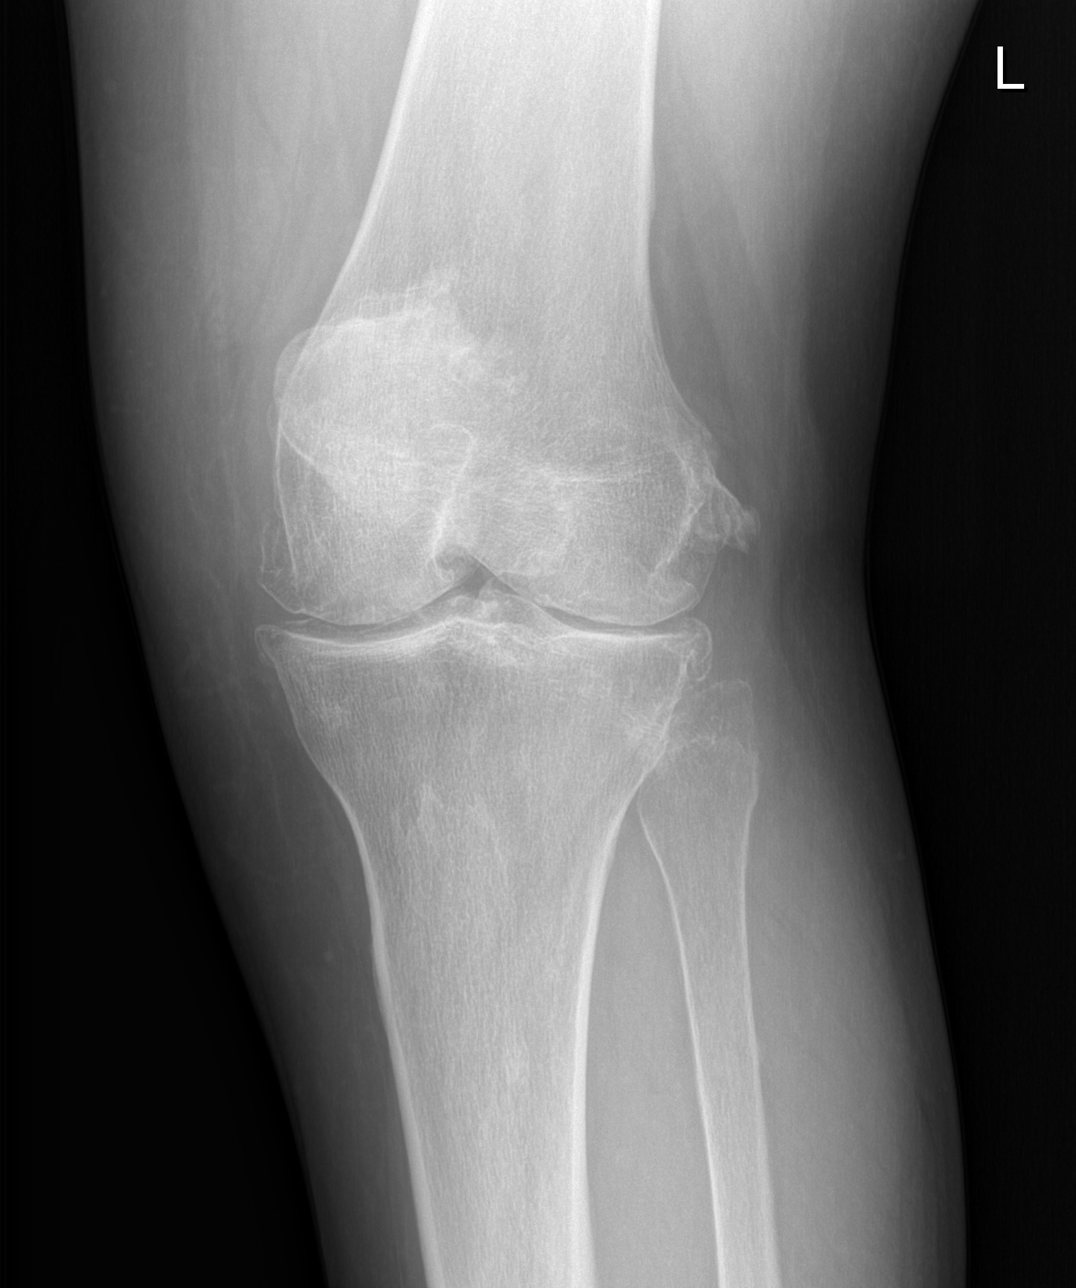

[t knee oblique left (2 of 2)]
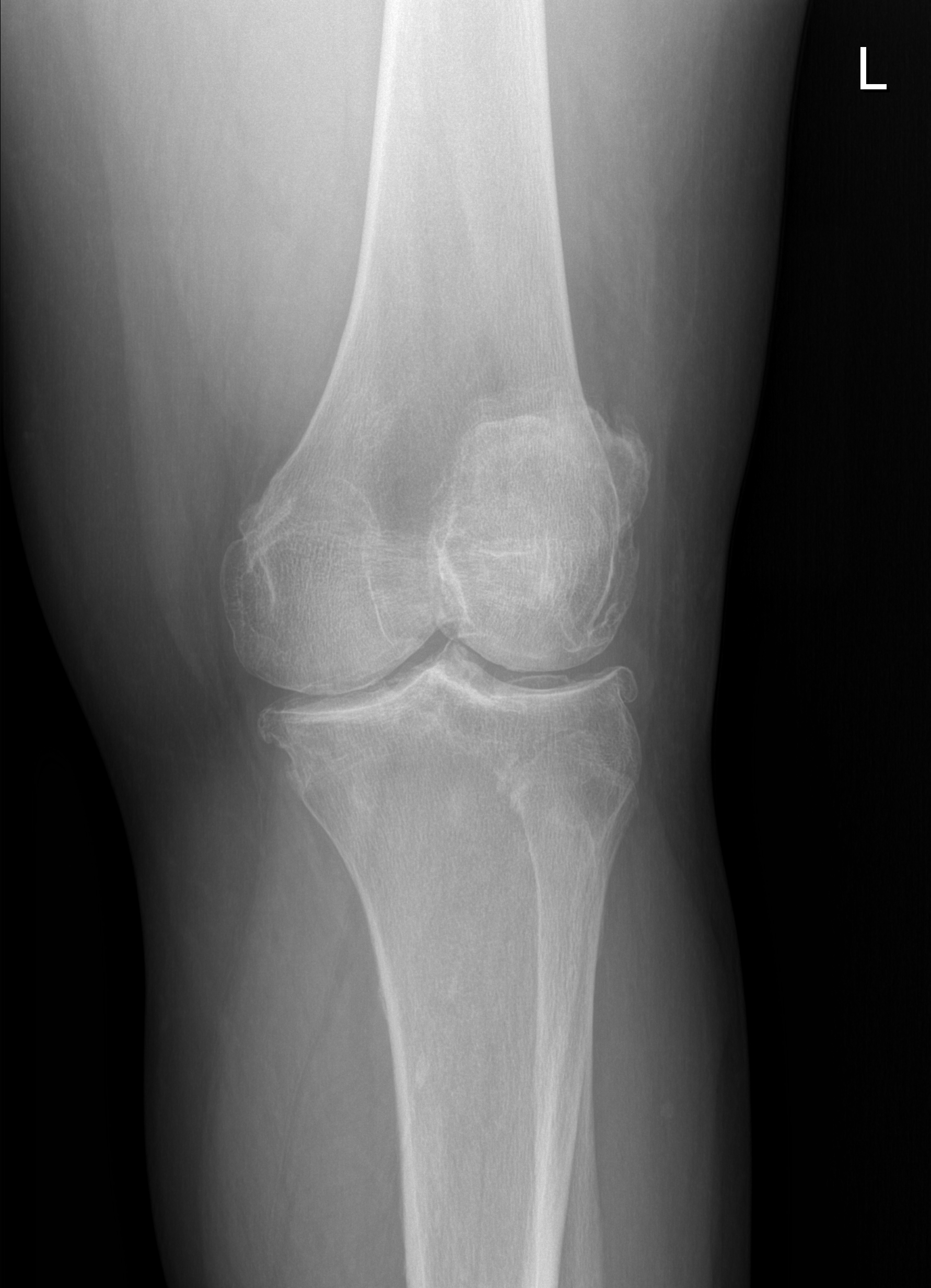

[t knee lat left]
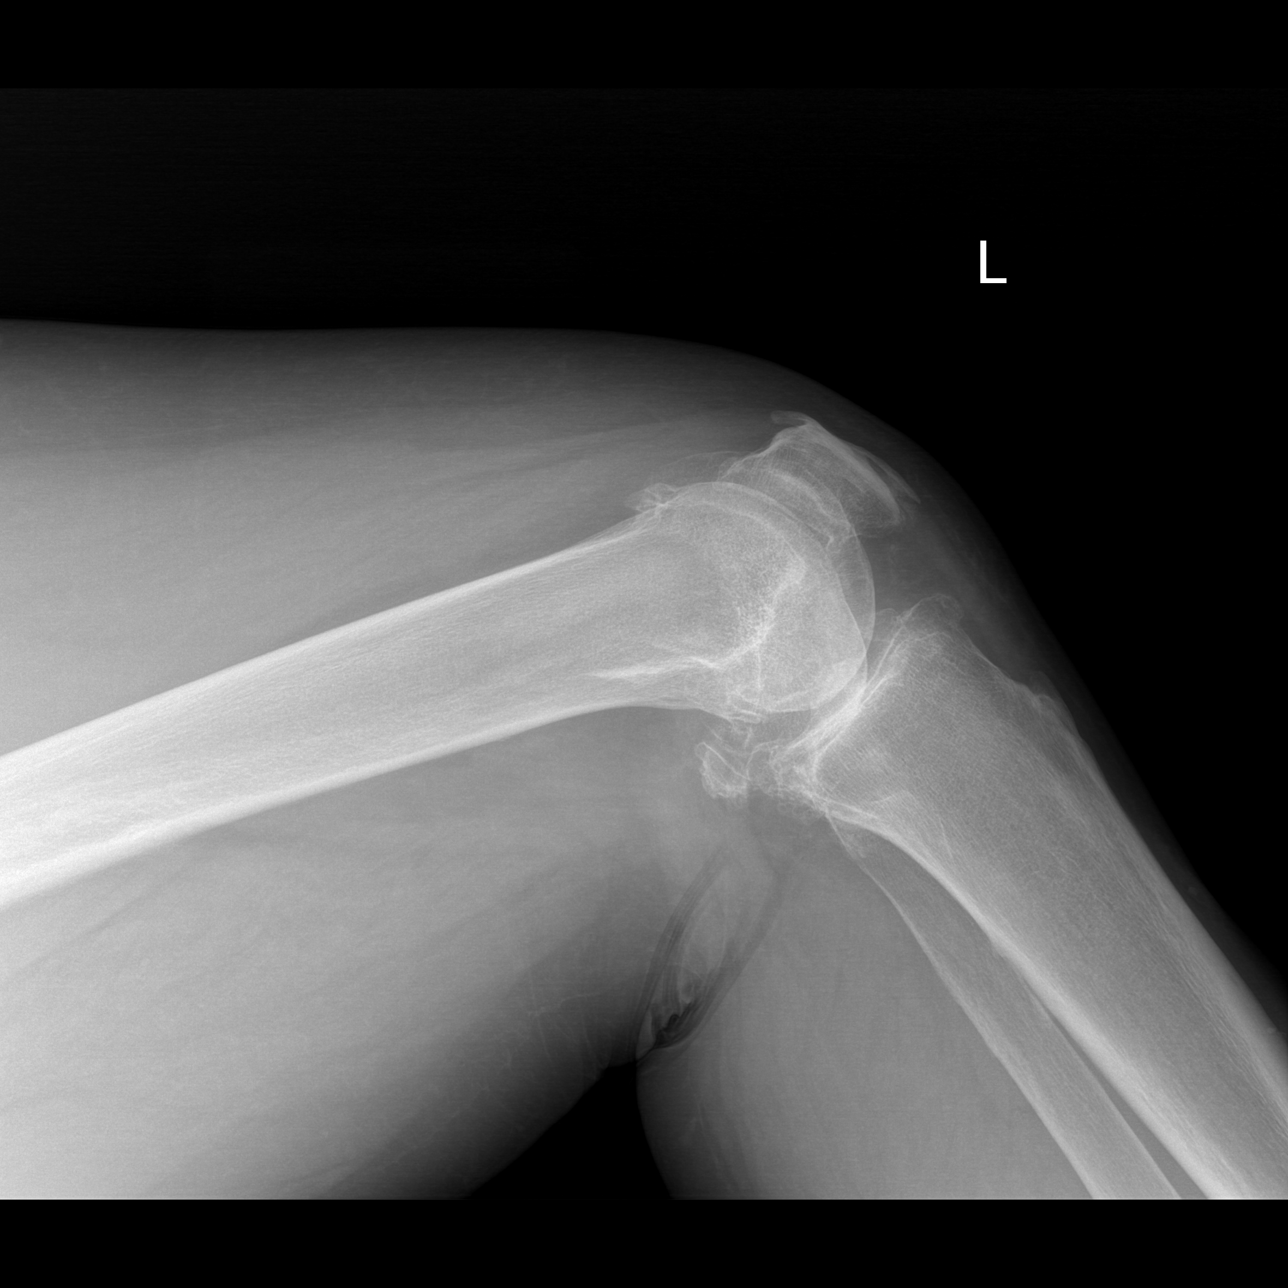

[4 of 4 positions shown; findings below may reference images not displayed]

FINDINGS: Exam demonstrates moderate tricompartmental osteoarthritic change
with interval progression. No effusion. No acute fracture or
dislocation.
IMPRESSION: 1. No acute findings.
2. Moderate tricompartmental osteoarthritis with interval
progression.

## 2022-12-23 ENCOUNTER — Ambulatory Visit (INDEPENDENT_AMBULATORY_CARE_PROVIDER_SITE_OTHER): Payer: BLUE CROSS/BLUE SHIELD | Admitting: Family Medicine

## 2022-12-23 VITALS — BP 131/81 | HR 72 | Temp 98.1°F | Resp 16 | Wt 259.4 lb

## 2022-12-23 DIAGNOSIS — R7303 Prediabetes: Secondary | ICD-10-CM | POA: Diagnosis not present

## 2022-12-23 DIAGNOSIS — Z6841 Body Mass Index (BMI) 40.0 and over, adult: Secondary | ICD-10-CM

## 2022-12-23 LAB — POCT GLYCOSYLATED HEMOGLOBIN (HGB A1C): Hemoglobin A1C: 6.5 % — AB (ref 4.0–5.6)

## 2022-12-25 ENCOUNTER — Encounter: Payer: Self-pay | Admitting: Family Medicine

## 2022-12-25 NOTE — Progress Notes (Signed)
Established Patient Office Visit  Subjective    Patient ID: Molly Hanson, female    DOB: Dec 19, 1955  Age: 67 y.o. MRN: [id number]  CC:  Chief Complaint  Patient presents with   Follow-up    HPI Molly LEMBERGER presents for follow up of chronic med issues. Patient denies acute complaints or concerns.    Outpatient Encounter Medications as of 12/23/2022  Medication Sig   cyclobenzaprine (FLEXERIL) 5 MG tablet Take 1 tablet (5 mg total) by mouth 3 (three) times daily as needed for muscle spasms.   Vitamin D, Ergocalciferol, (DRISDOL) 1.25 MG (50000 UNIT) CAPS capsule Take 1 capsule (50,000 Units total) by mouth every 7 (seven) days.   albuterol (VENTOLIN HFA) 108 (90 Base) MCG/ACT inhaler Inhale 2 puffs into the lungs every 6 (six) hours as needed for wheezing or shortness of breath. (Patient not taking: Reported on 08/04/2022)   cetirizine (ZYRTEC) 10 MG tablet Take 1 tablet (10 mg total) by mouth daily. (Patient not taking: Reported on 06/09/2022)   clotrimazole-betamethasone (LOTRISONE) cream Apply 1 application. topically 2 (two) times daily. (Patient not taking: Reported on 06/09/2022)   fluticasone (FLONASE) 50 MCG/ACT nasal spray Place 2 sprays into both nostrils daily. (Patient not taking: Reported on 06/09/2022)   No facility-administered encounter medications on file as of 12/23/2022.    Past Medical History:  Diagnosis Date   Arthritis    knees, arm    Hyperlipidemia    has been elevated in the past - no meds    Vitamin D deficiency     Past Surgical History:  Procedure Laterality Date   COLONOSCOPY     > 10 yrs ago- normal     Family History  Problem Relation Age of Onset   Breast cancer Mother    Diabetes Mother    Diabetes Father    Diabetes Maternal Grandmother    Stroke Maternal Grandmother    Colon polyps Maternal Grandmother    Colon cancer Maternal Grandfather    Diabetes Paternal Grandmother    Colon polyps Maternal Uncle    Esophageal cancer Neg  Hx    Rectal cancer Neg Hx    Stomach cancer Neg Hx     Social History   Socioeconomic History   Marital status: Single    Spouse name: Not on file   Number of children: Not on file   Years of education: Not on file   Highest education level: Not on file  Occupational History   Not on file  Tobacco Use   Smoking status: Former    Types: Cigarettes    Quit date: 07/15/2020    Years since quitting: 2.4    Passive exposure: Past   Smokeless tobacco: Never  Vaping Use   Vaping Use: Never used  Substance and Sexual Activity   Alcohol use: Yes   Drug use: Never   Sexual activity: Not on file  Other Topics Concern   Not on file  Social History Narrative   Not on file   Social Determinants of Health   Financial Resource Strain: Not on file  Food Insecurity: Not on file  Transportation Needs: Not on file  Physical Activity: Not on file  Stress: Not on file  Social Connections: Not on file  Intimate Partner Violence: Not on file    Review of Systems  All other systems reviewed and are negative.       Objective    BP 131/81   Pulse 72  Temp 98.1 F (36.7 C) (Oral)   Resp 16   Wt 259 lb 6.4 oz (117.7 kg)   SpO2 95%   BMI 44.53 kg/m   Physical Exam Vitals and nursing note reviewed.  Constitutional:      General: She is not in acute distress.    Appearance: She is obese.  Cardiovascular:     Rate and Rhythm: Normal rate and regular rhythm.  Pulmonary:     Effort: Pulmonary effort is normal.     Breath sounds: Normal breath sounds.  Neurological:     General: No focal deficit present.     Mental Status: She is alert and oriented to person, place, and time.         Assessment & Plan:   1. Prediabetes Slightly improved A1c and at goal. continue - POCT glycosylated hemoglobin (Hb A1C)  2. Class 3 severe obesity due to excess calories without serious comorbidity with body mass index (BMI) of 40.0 to 44.9 in adult [facility]) Discussed dietary and  activity options. Goal is 3-5lbs/mo weight loss    Return in about 6 months (around 06/23/2023) for follow up.   Becky Sax, MD

## 2023-01-23 ENCOUNTER — Other Ambulatory Visit: Payer: Self-pay

## 2023-01-23 ENCOUNTER — Encounter: Payer: Self-pay | Admitting: Emergency Medicine

## 2023-01-23 ENCOUNTER — Ambulatory Visit
Admission: EM | Admit: 2023-01-23 | Discharge: 2023-01-23 | Disposition: A | Discharge status: Home / Self Care | Payer: BLUE CROSS/BLUE SHIELD | Attending: Emergency Medicine | Admitting: Emergency Medicine

## 2023-01-23 DIAGNOSIS — J069 Acute upper respiratory infection, unspecified: Secondary | ICD-10-CM | POA: Diagnosis not present

## 2023-01-23 MED ORDER — PROMETHAZINE-DM 6.25-15 MG/5ML PO SYRP: 5.0000 mL | ORAL_SOLUTION | Freq: Every evening | ORAL | 0 refills | Status: DC | PRN

## 2023-01-23 MED ORDER — PREDNISONE 20 MG PO TABS: 40.0000 mg | ORAL_TABLET | Freq: Every day | ORAL | 0 refills | Status: DC

## 2023-01-23 MED ORDER — BENZONATATE 100 MG PO CAPS: 100.0000 mg | ORAL_CAPSULE | Freq: Three times a day (TID) | ORAL | 0 refills | Status: DC

## 2023-01-23 NOTE — ED Triage Notes (Signed)
Pt here for cough and congestion; pain with cough x 2 days; pt sts hx of bronchitis

## 2023-01-23 NOTE — Discharge Instructions (Addendum)
Your symptoms today are most likely being caused by a virus and should steadily improve in time it can take up to 7 to 10 days before you truly start to see a turnaround however things will get better  Begin use of prednisone every morning for 5 days, this helps to reduce inflammation and irritation to your airway which should help calm your harsh cough as well as help with your discomfort around the ribs  You may use Tessalon pill every 8 hours to help calm your coughing  You may use cough syrup at bedtime to allow for rest for additional comfort, be mindful this medication may make you drowsy    You can take Tylenol and/or Ibuprofen as needed for fever reduction and pain relief.   For cough: honey 1/2 to 1 teaspoon (you can dilute the honey in water or another fluid).  You can also use guaifenesin and dextromethorphan for cough. You can use a humidifier for chest congestion and cough.  If you don't have a humidifier, you can sit in the bathroom with the hot shower running.      For sore throat: try warm salt water gargles, cepacol lozenges, throat spray, warm tea or water with lemon/honey, popsicles or ice, or OTC cold relief medicine for throat discomfort.   For congestion: take a daily anti-histamine like Zyrtec, Claritin, and a oral decongestant, such as pseudoephedrine.  You can also use Flonase 1-2 sprays in each nostril daily.   It is important to stay hydrated: drink plenty of fluids (water, gatorade/powerade/pedialyte, juices, or teas) to keep your throat moisturized and help further relieve irritation/discomfort.

## 2023-01-23 NOTE — ED Provider Notes (Signed)
[name]    CSN: [id number] Arrival date & time: 01/23/23  0910      History   Chief Complaint Chief Complaint  Patient presents with   Cough    HPI Molly Hanson is a 67 y.o. female.   Patient presents for evaluation of nasal congestion, rhinorrhea and nonproductive cough present for 2 days.  Endorses bilateral rib pain worsened by coughing.  Has had a scratchy throat as well.  History of bronchitis, former smoker.  Has attempted use of Claritin.  Tolerating food and liquids.  No known sick contacts but believes is related to being out in the rain throughout the week.  Denies shortness of breath or wheezing, fevers.  Past Medical History:  Diagnosis Date   Arthritis    knees, arm    Hyperlipidemia    has been elevated in the past - no meds    Vitamin D deficiency     Patient Active Problem List   Diagnosis Date Noted   Hx of adenomatous colonic polyps 04/30/2022    Past Surgical History:  Procedure Laterality Date   COLONOSCOPY     > 10 yrs ago- normal     OB History   No obstetric history on file.      Home Medications    Prior to Admission medications   Medication Sig Start Date End Date Taking? Authorizing Provider  albuterol (VENTOLIN HFA) 108 (90 Base) MCG/ACT inhaler Inhale 2 puffs into the lungs every 6 (six) hours as needed for wheezing or shortness of breath. Patient not taking: Reported on 08/04/2022    [provider]  cetirizine (ZYRTEC) 10 MG tablet Take 1 tablet (10 mg total) by mouth daily. Patient not taking: Reported on 06/09/2022 06/03/22   Dorna Mai, MD  clotrimazole-betamethasone (LOTRISONE) cream Apply 1 application. topically 2 (two) times daily. Patient not taking: Reported on 06/09/2022    [provider]  cyclobenzaprine (FLEXERIL) 5 MG tablet Take 1 tablet (5 mg total) by mouth 3 (three) times daily as needed for muscle spasms. 08/20/22   Dorna Mai, MD  fluticasone [facility]) 50 MCG/ACT nasal  spray Place 2 sprays into both nostrils daily. Patient not taking: Reported on 06/09/2022 06/03/22   Dorna Mai, MD  Vitamin D, Ergocalciferol, (DRISDOL) 1.25 MG (50000 UNIT) CAPS capsule Take 1 capsule (50,000 Units total) by mouth every 7 (seven) days. 08/05/22   Dorna Mai, MD    Family History Family History  Problem Relation Age of Onset   Breast cancer Mother    Diabetes Mother    Diabetes Father    Diabetes Maternal Grandmother    Stroke Maternal Grandmother    Colon polyps Maternal Grandmother    Colon cancer Maternal Grandfather    Diabetes Paternal Grandmother    Colon polyps Maternal Uncle    Esophageal cancer Neg Hx    Rectal cancer Neg Hx    Stomach cancer Neg Hx     Social History Social History   Tobacco Use   Smoking status: Former    Types: Cigarettes    Quit date: 07/15/2020    Years since quitting: 2.5    Passive exposure: Past   Smokeless tobacco: Never  Vaping Use   Vaping Use: Never used  Substance Use Topics   Alcohol use: Yes   Drug use: Never     Allergies   Patient has no known allergies.   Review of Systems Review of Systems  Constitutional: Negative.   HENT:  Positive  for congestion and rhinorrhea. Negative for dental problem, drooling, ear discharge, ear pain, facial swelling, hearing loss, mouth sores, nosebleeds, postnasal drip, sinus pressure, sinus pain, sneezing, sore throat, tinnitus, trouble swallowing and voice change.   Respiratory:  Positive for cough. Negative for apnea, choking, chest tightness, shortness of breath, wheezing and stridor.   Cardiovascular: Negative.   Gastrointestinal: Negative.      Physical Exam Triage Vital Signs ED Triage Vitals [01/23/23 0940]  Enc Vitals Group     BP (!) 150/82     Pulse Rate 83     Resp 18     Temp 98.7 F (37.1 C)     Temp Source Oral     SpO2 94 %     Weight      Height      Head Circumference      Peak Flow      Pain Score 3     Pain Loc      Pain Edu?       Excl. in [place]?    No data found.  Updated Vital Signs BP (!) 150/82 (BP Location: Left Arm)   Pulse 83   Temp 98.7 F (37.1 C) (Oral)   Resp 18   SpO2 94%   Visual Acuity Right Eye Distance:   Left Eye Distance:   Bilateral Distance:    Right Eye Near:   Left Eye Near:    Bilateral Near:     Physical Exam Constitutional:      Appearance: Normal appearance.  HENT:     Head: Normocephalic.     Right Ear: Tympanic membrane, ear canal and external ear normal.     Left Ear: Tympanic membrane, ear canal and external ear normal.     Nose: Congestion present. No rhinorrhea.     Mouth/Throat:     Mouth: Mucous membranes are moist.     Pharynx: Posterior oropharyngeal erythema present.  Eyes:     Extraocular Movements: Extraocular movements intact.  Cardiovascular:     Rate and Rhythm: Regular rhythm.     Pulses: Normal pulses.     Heart sounds: Normal heart sounds.  Pulmonary:     Effort: Pulmonary effort is normal.     Breath sounds: Normal breath sounds.  Skin:    General: Skin is warm and dry.  Neurological:     Mental Status: She is alert and oriented to person, place, and time. Mental status is at baseline.  Psychiatric:        Mood and Affect: Mood normal.        Behavior: Behavior normal.      UC Treatments / Results  Labs (all labs ordered are listed, but only abnormal results are displayed) Labs Reviewed - No data to display  EKG   Radiology No results found.  Procedures Procedures (including critical care time)  Medications Ordered in UC Medications - No data to display  Initial Impression / Assessment and Plan / UC Course  I have reviewed the triage vital signs and the nursing notes.  Pertinent labs & imaging results that were available during my care of the patient were reviewed by me and considered in my medical decision making (see chart for details).   Viral URI with cough  Patient is in no signs of distress nor toxic appearing.  Vital  signs are stable.  Low suspicion for pneumonia, pneumothorax or bronchitis and therefore will defer imaging.  Declined COVID testing at this time.  Prescribed prednisone,  Tessalon and Promethazine DM for outpatient management.May use additional over-the-counter medications as needed for supportive care.  May follow-up with urgent care as needed if symptoms persist or worsen.   Final Clinical Impressions(s) / UC Diagnoses   Final diagnoses:  None   Discharge Instructions   None    ED Prescriptions   None    PDMP not reviewed this encounter.   Hans Eden, NP 01/23/23 1009

## 2023-01-28 ENCOUNTER — Encounter: Payer: Self-pay | Admitting: Radiology

## 2023-05-11 ENCOUNTER — Other Ambulatory Visit: Payer: Self-pay | Admitting: Family Medicine

## 2023-05-11 NOTE — Telephone Encounter (Signed)
Requested medication (s) are due for refill today - unsure  Requested medication (s) are on the active medication list -yes  Future visit scheduled -no  Last refill: 04/17/22- listed as historical provider  Notes to clinic: off protocol- provider review   Requested Prescriptions  Pending Prescriptions Disp Refills   clotrimazole-betamethasone (LOTRISONE) cream 30 g     Sig: Apply 1 Application topically 2 (two) times daily.     Off-Protocol Failed - 05/11/2023 11:20 AM      Failed - Medication not assigned to a protocol, review manually.      Passed - Valid encounter within last 12 months    Recent Outpatient Visits           4 months ago Prediabetes   [place] [facility], MD   7 months ago Prediabetes   [place] [facility], Lauris Poag, MD   8 months ago Left upper quadrant abdominal pain   [place] [facility], MD   8 months ago Foot lesion   [place] [facility], MD   9 months ago Annual physical exam   [place] [facility], MD                 Requested Prescriptions  Pending Prescriptions Disp Refills   clotrimazole-betamethasone (LOTRISONE) cream 30 g     Sig: Apply 1 Application topically 2 (two) times daily.     Off-Protocol Failed - 05/11/2023 11:20 AM      Failed - Medication not assigned to a protocol, review manually.      Passed - Valid encounter within last 12 months    Recent Outpatient Visits           4 months ago Prediabetes   [place] [facility], MD   7 months ago Prediabetes   [place] [facility], Lauris Poag, MD   8 months ago Left upper quadrant abdominal pain   [place] [facility], MD   8 months ago Foot lesion   [place] [facility], MD   9 months ago Annual physical exam   [facility], MD

## 2023-05-11 NOTE — Telephone Encounter (Signed)
Medication Refill - Medication: clotrimazole-betamethasone (LOTRISONE) cream   Has the patient contacted their pharmacy no  [id number] - [address] ([address]), [place] - [address]  [address] [place] ([place]) [place] [address]  Phone: [phone] Fax: [phone]  Hours: Not open 24 hours     Has the patient been seen for an appointment in the last year OR does the patient have an upcoming appointment? Yes.    Agent: Please be advised that RX refills may take up to 3 business days. We ask that you follow-up with your pharmacy.

## 2023-05-12 ENCOUNTER — Other Ambulatory Visit: Payer: Self-pay | Admitting: Family Medicine

## 2023-05-12 MED ORDER — CLOTRIMAZOLE-BETAMETHASONE 1-0.05 % EX CREA: 1.0000 | TOPICAL_CREAM | Freq: Two times a day (BID) | CUTANEOUS | 1 refills | Status: DC

## 2023-05-24 NOTE — Progress Notes (Unsigned)
Patient ID: Molly Hanson, female    DOB: 14-Nov-1956  MRN: [id number]  CC: Prediabetes Follow-Up  Subjective: Molly Hanson is a 67 y.o. female who presents for prediabetes follow-up.   Her concerns today include:  - Prediabetes follow-up. Thinks night sweats may be related.  - Concern for possible circulation issues of left lower extremity due to pain and intermittent swelling. Endorses numbness of toes of left foot. Endorses intermittent cramping of bilateral lower extremities. She denies associated red flag symptoms. - No further issues/concerns for discussion today.   Patient Active Problem List   Diagnosis Date Noted   Hx of adenomatous colonic polyps 04/30/2022     Current Outpatient Medications on File Prior to Visit  Medication Sig Dispense Refill   benzonatate (TESSALON) 100 MG capsule Take 1 capsule (100 mg total) by mouth every 8 (eight) hours. 21 capsule 0   clotrimazole-betamethasone (LOTRISONE) cream Apply 1 Application topically 2 (two) times daily. 45 g 1   cyclobenzaprine (FLEXERIL) 5 MG tablet Take 1 tablet (5 mg total) by mouth 3 (three) times daily as needed for muscle spasms. 30 tablet 1   predniSONE (DELTASONE) 20 MG tablet Take 2 tablets (40 mg total) by mouth daily. 10 tablet 0   promethazine-dextromethorphan (PROMETHAZINE-DM) 6.25-15 MG/5ML syrup Take 5 mLs by mouth at bedtime as needed for cough. 118 mL 0   albuterol (VENTOLIN HFA) 108 (90 Base) MCG/ACT inhaler Inhale 2 puffs into the lungs every 6 (six) hours as needed for wheezing or shortness of breath. (Patient not taking: Reported on 08/04/2022)     cetirizine (ZYRTEC) 10 MG tablet Take 1 tablet (10 mg total) by mouth daily. (Patient not taking: Reported on 06/09/2022) 30 tablet 3   fluticasone (FLONASE) 50 MCG/ACT nasal spray Place 2 sprays into both nostrils daily. (Patient not taking: Reported on 06/09/2022) 16 g 3   Vitamin D, Ergocalciferol, (DRISDOL) 1.25 MG (50000 UNIT) CAPS capsule Take 1 capsule  (50,000 Units total) by mouth every 7 (seven) days. 12 capsule 0   No current facility-administered medications on file prior to visit.    No Known Allergies  Social History   Socioeconomic History   Marital status: Single    Spouse name: Not on file   Number of children: Not on file   Years of education: Not on file   Highest education level: Not on file  Occupational History   Not on file  Tobacco Use   Smoking status: Former    Types: Cigarettes    Quit date: 07/15/2020    Years since quitting: 2.8    Passive exposure: Past   Smokeless tobacco: Never  Vaping Use   Vaping Use: Never used  Substance and Sexual Activity   Alcohol use: Yes   Drug use: Never   Sexual activity: Not on file  Other Topics Concern   Not on file  Social History Narrative   Not on file   Social Determinants of Health   Financial Resource Strain: Not on file  Food Insecurity: Not on file  Transportation Needs: Not on file  Physical Activity: Not on file  Stress: Not on file  Social Connections: Not on file  Intimate Partner Violence: Not on file    Family History  Problem Relation Age of Onset   Breast cancer Mother    Diabetes Mother    Diabetes Father    Diabetes Maternal Grandmother    Stroke Maternal Grandmother    Colon polyps Maternal Grandmother  Colon cancer Maternal Grandfather    Diabetes Paternal Grandmother    Colon polyps Maternal Uncle    Esophageal cancer Neg Hx    Rectal cancer Neg Hx    Stomach cancer Neg Hx     Past Surgical History:  Procedure Laterality Date   COLONOSCOPY     > 10 yrs ago- normal     ROS: Review of Systems Negative except as stated above  PHYSICAL EXAM: BP 123/81 (BP Location: Right Arm, Patient Position: Sitting, Cuff Size: Large)   Pulse 68   Temp 98.5 F (36.9 C)   Resp 16   Ht 5\' 6"  (1.676 m)   Wt 246 lb 12.8 oz (111.9 kg)   SpO2 98%   BMI 39.83 kg/m   Physical Exam HENT:     Head: Normocephalic and atraumatic.      Nose: Nose normal.     Mouth/Throat:     Mouth: Mucous membranes are moist.     Pharynx: Oropharynx is clear.  Eyes:     Extraocular Movements: Extraocular movements intact.     Conjunctiva/sclera: Conjunctivae normal.     Pupils: Pupils are equal, round, and reactive to light.  Cardiovascular:     Rate and Rhythm: Normal rate and regular rhythm.     Pulses: Normal pulses.     Heart sounds: Normal heart sounds.  Pulmonary:     Effort: Pulmonary effort is normal.     Breath sounds: Normal breath sounds.  Musculoskeletal:        General: Normal range of motion.     Right shoulder: Normal.     Left shoulder: Normal.     Right upper arm: Normal.     Left upper arm: Normal.     Right elbow: Normal.     Left elbow: Normal.     Right forearm: Normal.     Left forearm: Normal.     Right wrist: Normal.     Left wrist: Normal.     Right hand: Normal.     Left hand: Normal.     Cervical back: Normal, normal range of motion and neck supple.     Thoracic back: Normal.     Lumbar back: Normal.     Right hip: Normal.     Left hip: Normal.     Right upper leg: Normal.     Left upper leg: Normal.     Right knee: Normal.     Left knee: Normal.     Right lower leg: Normal.     Left lower leg: Normal.     Right ankle: Normal.     Left ankle: Normal.     Right foot: Normal.     Left foot: Normal.  Skin:    General: Skin is warm and dry.  Neurological:     General: No focal deficit present.     Mental Status: She is alert and oriented to person, place, and time.  Psychiatric:        Mood and Affect: Mood normal.        Behavior: Behavior normal.     ASSESSMENT AND PLAN: 1. Prediabetes - Routine screening.  - Hemoglobin A1c  2. Left leg pain 3. Leg cramping 4. Numbness - Gabapentin as prescribed. Counseled on medication adherence/adverse effects.  - Vascular Ultrasound Lower Extremity for further evaluation.  - Referral to Vascular Surgery for further evaluation/management.  During the interim follow-up with primary provider in 4 weeks or sooner if needed.  -  gabapentin (NEURONTIN) 300 MG capsule; Take 1 capsule (300 mg total) by mouth at bedtime.  Dispense: 30 capsule; Refill: 1 - VAS [place] LOWER EXTREMITY VENOUS (DVT); Future - Ambulatory referral to Vascular Surgery   Patient was given the opportunity to ask questions.  Patient verbalized understanding of the plan and was able to repeat key elements of the plan. Patient was given clear instructions to go to Emergency Department or return to medical center if symptoms don't improve, worsen, or new problems develop.The patient verbalized understanding.   Orders Placed This Encounter  Procedures   Hemoglobin A1c   Ambulatory referral to Vascular Surgery   VAS [place] LOWER EXTREMITY VENOUS (DVT)     Requested Prescriptions   Signed Prescriptions Disp Refills   gabapentin (NEURONTIN) 300 MG capsule 30 capsule 1    Sig: Take 1 capsule (300 mg total) by mouth at bedtime.    Return in 4 weeks (on 06/23/2023) for Follow-Up or next available Georganna Skeans, MD .  Rema Fendt, NP

## 2023-05-26 ENCOUNTER — Ambulatory Visit (INDEPENDENT_AMBULATORY_CARE_PROVIDER_SITE_OTHER): Payer: BLUE CROSS/BLUE SHIELD | Admitting: Family

## 2023-05-26 ENCOUNTER — Encounter: Payer: Self-pay | Admitting: Family

## 2023-05-26 VITALS — BP 123/81 | HR 68 | Temp 98.5°F | Resp 16 | Ht 66.0 in | Wt 246.8 lb

## 2023-05-26 DIAGNOSIS — R7303 Prediabetes: Secondary | ICD-10-CM

## 2023-05-26 DIAGNOSIS — R200 Anesthesia of skin: Secondary | ICD-10-CM | POA: Diagnosis not present

## 2023-05-26 DIAGNOSIS — M79605 Pain in left leg: Secondary | ICD-10-CM

## 2023-05-26 DIAGNOSIS — R252 Cramp and spasm: Secondary | ICD-10-CM | POA: Diagnosis not present

## 2023-05-26 MED ORDER — GABAPENTIN 300 MG PO CAPS: 300.0000 mg | ORAL_CAPSULE | Freq: Every day | ORAL | 1 refills | Status: DC

## 2023-05-26 NOTE — Progress Notes (Signed)
Pt is here for f/u   Complaining of possible circulation issues in her left leg and left foot   Excessive cramping in both legs   Numbness in her toes on her left foot   Night sweats started X3 weeks ago

## 2023-06-03 ENCOUNTER — Other Ambulatory Visit: Payer: Self-pay | Admitting: *Deleted

## 2023-06-03 DIAGNOSIS — M79605 Pain in left leg: Secondary | ICD-10-CM

## 2023-06-04 ENCOUNTER — Telehealth: Payer: Self-pay | Admitting: Family Medicine

## 2023-06-04 ENCOUNTER — Other Ambulatory Visit: Payer: BLUE CROSS/BLUE SHIELD

## 2023-06-04 DIAGNOSIS — R7303 Prediabetes: Secondary | ICD-10-CM

## 2023-06-04 NOTE — Telephone Encounter (Signed)
Patient would like a referral for [facility]

## 2023-06-05 LAB — HEMOGLOBIN A1C
Est. average glucose Bld gHb Est-mCnc: 137 mg/dL
Hgb A1c MFr Bld: 6.4 % — ABNORMAL HIGH (ref 4.8–5.6)

## 2023-06-07 ENCOUNTER — Encounter: Payer: Self-pay | Admitting: Family

## 2023-06-07 NOTE — Telephone Encounter (Signed)
Called patient and left vm to call office back to schedule video visit appointment for referral

## 2023-06-07 NOTE — Telephone Encounter (Signed)
Please schedule video visit

## 2023-06-07 NOTE — Telephone Encounter (Signed)
Patient request referral  

## 2023-06-08 ENCOUNTER — Ambulatory Visit: Payer: Self-pay | Admitting: *Deleted

## 2023-06-08 NOTE — Telephone Encounter (Signed)
Schedule appointment?

## 2023-06-08 NOTE — Telephone Encounter (Signed)
Summary: A1C had been high due to labwork + pt having night sweats and fatigue   Per agent: "Patient called and stated her A1C was high from labwork she had done on Friday and she has been having night sweats and fatigue and patient stated vision has changed too, due to this and wanted to know what she can do for these symptoms and to bring down the A1C?   Patients callback#: [phone]"           Chief Complaint: Advise Symptoms: NA Frequency: NA Pertinent Negatives: Patient denies NA Disposition: [] ED /[] Urgent Care (no appt availability in office) / [] Appointment(In office/virtual)/ []  [place] [facility]/ [] Home Care/ [] Refused Recommended Disposition /[] Day Mobile Bus/ [x]  Follow-up with PCP Additional Notes:  Pt discussed symptoms at last OV 05/26/23. Calling to request information on how to lower A1C. Discussed diet modification, advised PCP best resource. Would like referral to nutritionist if appropriate, available. Requests any resource material available. Assured NT would route to practice for PCPs review. Pt also left MyChart message regarding request. Please advise.  Reason for Disposition  [1] Caller requesting NON-URGENT health information AND [2] PCP's office is the best resource  Answer Assessment - Initial Assessment Questions 1. REASON FOR CALL or QUESTION: "What is your reason for calling today?" or "How can I best help you?" or "What question do you have that I can help answer?"     Education on lowering A1C  Protocols used: Information Only Call - No Triage-A-AH

## 2023-06-09 NOTE — Telephone Encounter (Signed)
Patient scheduled.

## 2023-06-11 ENCOUNTER — Ambulatory Visit: Payer: BLUE CROSS/BLUE SHIELD | Admitting: Podiatry

## 2023-06-16 ENCOUNTER — Encounter: Payer: Self-pay | Admitting: Family Medicine

## 2023-06-16 ENCOUNTER — Ambulatory Visit (INDEPENDENT_AMBULATORY_CARE_PROVIDER_SITE_OTHER): Payer: BLUE CROSS/BLUE SHIELD | Admitting: Family Medicine

## 2023-06-16 VITALS — BP 133/80 | HR 62 | Temp 98.1°F | Resp 16 | Wt 243.2 lb

## 2023-06-16 DIAGNOSIS — Z130 Encounter for screening for diseases of the blood and blood-forming organs and certain disorders involving the immune mechanism: Secondary | ICD-10-CM

## 2023-06-16 DIAGNOSIS — Z0000 Encounter for general adult medical examination without abnormal findings: Secondary | ICD-10-CM

## 2023-06-16 DIAGNOSIS — Z13220 Encounter for screening for lipoid disorders: Secondary | ICD-10-CM

## 2023-06-17 ENCOUNTER — Encounter: Payer: Self-pay | Admitting: Family Medicine

## 2023-06-17 LAB — CMP14+EGFR
ALT: 17 IU/L (ref 0–32)
AST: 15 IU/L (ref 0–40)
Albumin: 4.3 g/dL (ref 3.9–4.9)
Alkaline Phosphatase: 81 IU/L (ref 44–121)
BUN/Creatinine Ratio: 13 (ref 12–28)
BUN: 12 mg/dL (ref 8–27)
Bilirubin Total: 0.5 mg/dL (ref 0.0–1.2)
CO2: 24 mmol/L (ref 20–29)
Calcium: 9.5 mg/dL (ref 8.7–10.3)
Chloride: 103 mmol/L (ref 96–106)
Creatinine, Ser: 0.89 mg/dL (ref 0.57–1.00)
Globulin, Total: 2.9 g/dL (ref 1.5–4.5)
Glucose: 105 mg/dL — ABNORMAL HIGH (ref 70–99)
Potassium: 4.3 mmol/L (ref 3.5–5.2)
Sodium: 140 mmol/L (ref 134–144)
eGFR: 72 mL/min/{1.73_m2} (ref >59)

## 2023-06-17 LAB — LIPID PANEL
Chol/HDL Ratio: 4 ratio (ref 0.0–4.4)
Cholesterol, Total: 189 mg/dL (ref 100–199)
HDL: 47 mg/dL (ref >39)
LDL Chol Calc (NIH): 127 mg/dL — ABNORMAL HIGH (ref 0–99)
Triglycerides: 80 mg/dL (ref 0–149)
VLDL Cholesterol Cal: 15 mg/dL (ref 5–40)

## 2023-06-17 LAB — CBC WITH DIFFERENTIAL/PLATELET
Basophils Absolute: 0.1 10*3/uL (ref 0.0–0.2)
EOS (ABSOLUTE): 0.1 10*3/uL (ref 0.0–0.4)
Eos: 1 %
Hematocrit: 44.9 % (ref 34.0–46.6)
Hemoglobin: 14.7 g/dL (ref 11.1–15.9)
Lymphocytes Absolute: 2.1 10*3/uL (ref 0.7–3.1)
Lymphs: 29 %
MCH: 28.5 pg (ref 26.6–33.0)
MCHC: 32.7 g/dL (ref 31.5–35.7)
Monocytes Absolute: 0.5 10*3/uL (ref 0.1–0.9)
Monocytes: 6 %
Neutrophils Absolute: 4.5 10*3/uL (ref 1.4–7.0)
Neutrophils: 63 %
Platelets: 264 10*3/uL (ref 150–450)
RBC: 5.16 x10E6/uL (ref 3.77–5.28)
RDW: 13.1 % (ref 11.7–15.4)
WBC: 7.2 10*3/uL (ref 3.4–10.8)

## 2023-06-17 LAB — VITAMIN D 25 HYDROXY (VIT D DEFICIENCY, FRACTURES): Vit D, 25-Hydroxy: 28.6 ng/mL — ABNORMAL LOW (ref 30.0–100.0)

## 2023-06-17 LAB — HEMOGLOBIN A1C
Est. average glucose Bld gHb Est-mCnc: 146 mg/dL
Hgb A1c MFr Bld: 6.7 % — ABNORMAL HIGH (ref 4.8–5.6)

## 2023-06-17 LAB — TSH: TSH: 1.33 u[IU]/mL (ref 0.450–4.500)

## 2023-06-21 ENCOUNTER — Encounter: Payer: Self-pay | Admitting: Family Medicine

## 2023-06-21 ENCOUNTER — Other Ambulatory Visit: Payer: Self-pay | Admitting: Family Medicine

## 2023-06-21 MED ORDER — VITAMIN D (ERGOCALCIFEROL) 1.25 MG (50000 UNIT) PO CAPS: 50000.0000 [IU] | ORAL_CAPSULE | ORAL | 0 refills | Status: DC

## 2023-06-21 NOTE — Progress Notes (Unsigned)
[facility] [place]  ASSESSMENT / PLAN: Molly Hanson is a 67 y.o. female with left leg paresthesias and small toe numbness. No evidence of peripheral arterial disease on physical exam or non-invasive testing today. Encouraged healthy lifestyle and continued follow up with PCP to help avoid known risk factors for developing peripheral arterial disease. She can follow up with me as needed.  CHIEF COMPLAINT: left leg paresthesias  HISTORY OF PRESENT ILLNESS: Molly Hanson is a 67 y.o. female referred to clinic for evaluation of possible peripheral arterial disease.  The patient reports paresthesias in her left leg.  These occur when standing for long period of time.  These also occur when walking for long period of time.  She does get some relief with positional changes.  She has persistent numbness in the left toe.  She also describes upper extremity paresthesias that occur when she is laying on her side.  These resolved with position change.  She does not describe cramping discomfort in the muscles of her legs with walking that is relieved by rest.  She does not describe rest pain in her feet.  She has no ulcers about her feet..  Past Medical History:  Diagnosis Date   Arthritis    knees, arm    Hyperlipidemia    has been elevated in the past - no meds    Vitamin D deficiency     Past Surgical History:  Procedure Laterality Date   COLONOSCOPY     > 10 yrs ago- normal     Family History  Problem Relation Age of Onset   Breast cancer Mother    Diabetes Mother    Diabetes Father    Diabetes Maternal Grandmother    Stroke Maternal Grandmother    Colon polyps Maternal Grandmother    Colon cancer Maternal Grandfather    Diabetes Paternal Grandmother    Colon polyps Maternal Uncle    Esophageal cancer Neg Hx    Rectal cancer Neg Hx    Stomach cancer Neg Hx     Social History   Socioeconomic History   Marital status: Single    Spouse name: Not on file    Number of children: Not on file   Years of education: Not on file   Highest education level: Associate degree: occupational, Scientist, product/process development, or vocational program  Occupational History   Not on file  Tobacco Use   Smoking status: Former    Current packs/day: 0.00    Types: Cigarettes    Quit date: 07/15/2020    Years since quitting: 2.9    Passive exposure: Past   Smokeless tobacco: Never  Vaping Use   Vaping status: Never Used  Substance and Sexual Activity   Alcohol use: Yes   Drug use: Never   Sexual activity: Not on file  Other Topics Concern   Not on file  Social History Narrative   Not on file   Social Determinants of Health   Financial Resource Strain: Patient Declined (06/14/2023)   Overall Financial Resource Strain (CARDIA)    Difficulty of Paying Living Expenses: Patient declined  Food Insecurity: Patient Declined (06/14/2023)   Hunger Vital Sign    Worried About Running Out of Food in the Last Year: Patient declined    Ran Out of Food in the Last Year: Patient declined  Transportation Needs: No Transportation Needs (06/14/2023)   PRAPARE - Administrator, Civil Service (Medical): No    Lack of Transportation (Non-Medical):  No  Physical Activity: Insufficiently Active (06/14/2023)   Exercise Vital Sign    Days of Exercise per Week: 3 days    Minutes of Exercise per Session: 30 min  Stress: No Stress Concern Present (06/14/2023)   [name] - Occupational Stress Questionnaire    Feeling of Stress : Not at all  Social Connections: Moderately Isolated (06/14/2023)   Social Connection and Isolation Panel [NHANES]    Frequency of Communication with Friends and Family: More than three times a week    Frequency of Social Gatherings with Friends and Family: Twice a week    Attends Religious Services: 1 to 4 times per year    Active Member of [employer] or Organizations: No    Attends Engineer, structural: Not on file    Marital  Status: Divorced  Catering manager Violence: Not on file    No Known Allergies  Current Outpatient Medications  Medication Sig Dispense Refill   benzonatate (TESSALON) 100 MG capsule Take 1 capsule (100 mg total) by mouth every 8 (eight) hours. 21 capsule 0   clotrimazole-betamethasone (LOTRISONE) cream Apply 1 Application topically 2 (two) times daily. 45 g 1   cyclobenzaprine (FLEXERIL) 5 MG tablet Take 1 tablet (5 mg total) by mouth 3 (three) times daily as needed for muscle spasms. 30 tablet 1   gabapentin (NEURONTIN) 300 MG capsule Take 1 capsule (300 mg total) by mouth at bedtime. 30 capsule 1   predniSONE (DELTASONE) 20 MG tablet Take 2 tablets (40 mg total) by mouth daily. 10 tablet 0   promethazine-dextromethorphan (PROMETHAZINE-DM) 6.25-15 MG/5ML syrup Take 5 mLs by mouth at bedtime as needed for cough. 118 mL 0   Vitamin D, Ergocalciferol, (DRISDOL) 1.25 MG (50000 UNIT) CAPS capsule Take 1 capsule (50,000 Units total) by mouth every 7 (seven) days. 12 capsule 0   albuterol (VENTOLIN HFA) 108 (90 Base) MCG/ACT inhaler Inhale 2 puffs into the lungs every 6 (six) hours as needed for wheezing or shortness of breath. (Patient not taking: Reported on 08/04/2022)     cetirizine (ZYRTEC) 10 MG tablet Take 1 tablet (10 mg total) by mouth daily. (Patient not taking: Reported on 06/09/2022) 30 tablet 3   fluticasone (FLONASE) 50 MCG/ACT nasal spray Place 2 sprays into both nostrils daily. (Patient not taking: Reported on 06/09/2022) 16 g 3   No current facility-administered medications for this visit.    PHYSICAL EXAM Vitals:   06/22/23 0822  BP: 124/73  Pulse: (!) 52  Resp: 20  Temp: 98 F (36.7 C)  SpO2: 96%  Weight: 242 lb (109.8 kg)  Height: 5\' 6"  (1.676 m)    Woman in no acute distress Regular rate and rhythm Unlabored breathing 2+ dorsalis pedis pulses bilaterally   PERTINENT LABORATORY AND RADIOLOGIC DATA  Most recent CBC    Latest Ref Rng & Units 06/16/2023   12:00  AM 08/04/2022   10:47 AM 04/13/2022   10:54 AM  CBC  WBC 3.4 - 10.8 x10E3/uL 7.2  7.6  7.8   Hemoglobin 11.1 - 15.9 g/dL 16.1  09.6  04.5   Hematocrit 34.0 - 46.6 % 44.9  44.9  45.5   Platelets 150 - 450 x10E3/uL 264  287  255      Most recent CMP    Latest Ref Rng & Units 06/16/2023   12:00 AM 08/04/2022   10:47 AM 04/13/2022   10:54 AM  CMP  Glucose 70 - 99 mg/dL 409  811  914  BUN 8 - 27 mg/dL 12  12  8    Creatinine 0.57 - 1.00 mg/dL 8.11  9.14  7.82   Sodium 134 - 144 mmol/L 140  141  141   Potassium 3.5 - 5.2 mmol/L 4.3  4.7  4.2   Chloride 96 - 106 mmol/L 103  101  102   CO2 20 - 29 mmol/L 24  24  23    Calcium 8.7 - 10.3 mg/dL 9.5  95.6  9.6   Total Protein 6.0 - 8.5 g/dL 7.2  7.3  7.4   Total Bilirubin 0.0 - 1.2 mg/dL 0.5  0.4  0.5   Alkaline Phos 44 - 121 IU/L 81  80  88   AST 0 - 40 IU/L 15  20  17    ALT 0 - 32 IU/L 17  19  18      Renal function Estimated Creatinine Clearance: 80.1 mL/min (by C-G formula based on SCr of 0.89 mg/dL).  Hgb A1c MFr Bld (%)  Date Value  06/16/2023 6.7 (H)    LDL Chol Calc (NIH)  Date Value Ref Range Status  06/16/2023 127 (H) 0 - 99 mg/dL Final     Vascular Imaging:  +-------+-----------+-----------+------------+------------+  ABI/TBIToday's ABIToday's TBIPrevious ABIPrevious TBI  +-------+-----------+-----------+------------+------------+  Right 1.08       1.02                                 +-------+-----------+-----------+------------+------------+  Left  1.12       0.91                                 +-------+-----------+-----------+------------+------------+    Rande Brunt. Lenell Antu, MD FACS [facility] Phone Number: [phone] 06/22/2023 8:37 AM   Total time spent on preparing this encounter including chart review, data review, collecting history, examining the patient, coordinating care for this new patient, 45 minutes.  Portions of this report may have  been transcribed using voice recognition software.  Every effort has been made to ensure accuracy; however, inadvertent computerized transcription errors may still be present.

## 2023-06-21 NOTE — Progress Notes (Signed)
Established Patient Office Visit  Subjective    Patient ID: Molly Hanson, female    DOB: 1956-02-05  Age: 67 y.o. MRN: [id number]  CC:  Chief Complaint  Patient presents with   Follow-up    Diabetes concern    HPI Molly Hanson presents for routine annual exam. Patient denies acute complaints.    Outpatient Encounter Medications as of 06/16/2023  Medication Sig   benzonatate (TESSALON) 100 MG capsule Take 1 capsule (100 mg total) by mouth every 8 (eight) hours.   clotrimazole-betamethasone (LOTRISONE) cream Apply 1 Application topically 2 (two) times daily.   cyclobenzaprine (FLEXERIL) 5 MG tablet Take 1 tablet (5 mg total) by mouth 3 (three) times daily as needed for muscle spasms.   gabapentin (NEURONTIN) 300 MG capsule Take 1 capsule (300 mg total) by mouth at bedtime.   predniSONE (DELTASONE) 20 MG tablet Take 2 tablets (40 mg total) by mouth daily.   promethazine-dextromethorphan (PROMETHAZINE-DM) 6.25-15 MG/5ML syrup Take 5 mLs by mouth at bedtime as needed for cough.   Vitamin D, Ergocalciferol, (DRISDOL) 1.25 MG (50000 UNIT) CAPS capsule Take 1 capsule (50,000 Units total) by mouth every 7 (seven) days.   albuterol (VENTOLIN HFA) 108 (90 Base) MCG/ACT inhaler Inhale 2 puffs into the lungs every 6 (six) hours as needed for wheezing or shortness of breath. (Patient not taking: Reported on 08/04/2022)   cetirizine (ZYRTEC) 10 MG tablet Take 1 tablet (10 mg total) by mouth daily. (Patient not taking: Reported on 06/09/2022)   fluticasone (FLONASE) 50 MCG/ACT nasal spray Place 2 sprays into both nostrils daily. (Patient not taking: Reported on 06/09/2022)   No facility-administered encounter medications on file as of 06/16/2023.    Past Medical History:  Diagnosis Date   Arthritis    knees, arm    Hyperlipidemia    has been elevated in the past - no meds    Vitamin D deficiency     Past Surgical History:  Procedure Laterality Date   COLONOSCOPY     > 10 yrs ago-  normal     Family History  Problem Relation Age of Onset   Breast cancer Mother    Diabetes Mother    Diabetes Father    Diabetes Maternal Grandmother    Stroke Maternal Grandmother    Colon polyps Maternal Grandmother    Colon cancer Maternal Grandfather    Diabetes Paternal Grandmother    Colon polyps Maternal Uncle    Esophageal cancer Neg Hx    Rectal cancer Neg Hx    Stomach cancer Neg Hx     Social History   Socioeconomic History   Marital status: Single    Spouse name: Not on file   Number of children: Not on file   Years of education: Not on file   Highest education level: Associate degree: occupational, Scientist, product/process development, or vocational program  Occupational History   Not on file  Tobacco Use   Smoking status: Former    Current packs/day: 0.00    Types: Cigarettes    Quit date: 07/15/2020    Years since quitting: 2.9    Passive exposure: Past   Smokeless tobacco: Never  Vaping Use   Vaping status: Never Used  Substance and Sexual Activity   Alcohol use: Yes   Drug use: Never   Sexual activity: Not on file  Other Topics Concern   Not on file  Social History Narrative   Not on file   Social Determinants of Health  Financial Resource Strain: Patient Declined (06/14/2023)   Overall Financial Resource Strain (CARDIA)    Difficulty of Paying Living Expenses: Patient declined  Food Insecurity: Patient Declined (06/14/2023)   Hunger Vital Sign    Worried About Running Out of Food in the Last Year: Patient declined    Ran Out of Food in the Last Year: Patient declined  Transportation Needs: No Transportation Needs (06/14/2023)   PRAPARE - Administrator, Civil Service (Medical): No    Lack of Transportation (Non-Medical): No  Physical Activity: Insufficiently Active (06/14/2023)   Exercise Vital Sign    Days of Exercise per Week: 3 days    Minutes of Exercise per Session: 30 min  Stress: No Stress Concern Present (06/14/2023)   [employer] of  Occupational Health - Occupational Stress Questionnaire    Feeling of Stress : Not at all  Social Connections: Moderately Isolated (06/14/2023)   Social Connection and Isolation Panel [NHANES]    Frequency of Communication with Friends and Family: More than three times a week    Frequency of Social Gatherings with Friends and Family: Twice a week    Attends Religious Services: 1 to 4 times per year    Active Member of [employer] or Organizations: No    Attends Engineer, structural: Not on file    Marital Status: Divorced  Intimate Partner Violence: Not on file    Review of Systems  All other systems reviewed and are negative.       Objective    BP 133/80   Pulse 62   Temp 98.1 F (36.7 C) (Oral)   Resp 16   Wt 243 lb 3.2 oz (110.3 kg)   SpO2 94%   BMI 39.25 kg/m   Physical Exam Vitals and nursing note reviewed.  Constitutional:      General: She is not in acute distress. HENT:     Head: Normocephalic and atraumatic.     Right Ear: Tympanic membrane, ear canal and external ear normal.     Left Ear: Tympanic membrane, ear canal and external ear normal.     Nose: Nose normal.     Mouth/Throat:     Mouth: Mucous membranes are moist.     Pharynx: Oropharynx is clear.  Eyes:     Conjunctiva/sclera: Conjunctivae normal.     Pupils: Pupils are equal, round, and reactive to light.  Neck:     Thyroid: No thyromegaly.  Cardiovascular:     Rate and Rhythm: Normal rate and regular rhythm.     Heart sounds: Normal heart sounds. No murmur heard. Pulmonary:     Effort: Pulmonary effort is normal. No respiratory distress.     Breath sounds: Normal breath sounds.  Abdominal:     General: There is no distension.     Palpations: Abdomen is soft. There is no mass.     Tenderness: There is no abdominal tenderness.  Musculoskeletal:        General: Normal range of motion.     Cervical back: Normal range of motion and neck supple.  Skin:    General: Skin is warm and dry.   Neurological:     General: No focal deficit present.     Mental Status: She is alert and oriented to person, place, and time.  Psychiatric:        Mood and Affect: Mood normal.        Behavior: Behavior normal.         Assessment &  Plan:   1. Annual physical exam  - CMP14+EGFR  2. Screening for deficiency anemia  - CBC with Differential  3. Screening for lipid disorders  - Lipid Panel  4. Screening for endocrine/metabolic/immunity disorders  - Vitamin D, 25-hydroxy - TSH - Hemoglobin A1c    No follow-ups on file.   Tommie Raymond, MD

## 2023-06-22 ENCOUNTER — Encounter: Payer: Self-pay | Admitting: Vascular Surgery

## 2023-06-22 ENCOUNTER — Ambulatory Visit (INDEPENDENT_AMBULATORY_CARE_PROVIDER_SITE_OTHER): Payer: BLUE CROSS/BLUE SHIELD | Admitting: Vascular Surgery

## 2023-06-22 ENCOUNTER — Ambulatory Visit (HOSPITAL_COMMUNITY)
Admission: RE | Admit: 2023-06-22 | Discharge: 2023-06-22 | Disposition: A | Discharge status: Home / Self Care | Payer: BLUE CROSS/BLUE SHIELD | Source: Ambulatory Visit | Attending: Vascular Surgery | Admitting: Vascular Surgery

## 2023-06-22 VITALS — BP 124/73 | HR 52 | Temp 98.0°F | Resp 20 | Ht 66.0 in | Wt 242.0 lb

## 2023-06-22 DIAGNOSIS — R202 Paresthesia of skin: Secondary | ICD-10-CM | POA: Diagnosis not present

## 2023-06-22 DIAGNOSIS — M79604 Pain in right leg: Secondary | ICD-10-CM

## 2023-06-22 DIAGNOSIS — M79605 Pain in left leg: Secondary | ICD-10-CM

## 2023-06-22 LAB — VAS US ABI WITH/WO TBI
Left ABI: 1.12
Right ABI: 1.08

## 2023-07-27 ENCOUNTER — Ambulatory Visit: Payer: Self-pay

## 2023-07-27 NOTE — Telephone Encounter (Signed)
  Chief Complaint: abd pain  Symptoms: cramping sensation, worse when in bed prove.  Frequency: 6 months Pertinent Negatives: Patient denies vomiting, back pain Disposition: [] ED /[] Urgent Care (no appt availability in office) / [x] Appointment(In office/virtual)/ []  [facility]/ [] Home Care/ [] Refused Recommended Disposition /[] [facility]/ []  Follow-up with PCP Additional Notes: appt tomorrow am Reason for Disposition  [1] MODERATE pain (e.g., interferes with normal activities) AND [2] pain comes and goes (cramps) AND [3] present > 24 hours  (Exception: Pain with Vomiting or Diarrhea - see that Guideline.)  Answer Assessment - Initial Assessment Questions 1. LOCATION: "Where does it hurt?"      Right side 2. RADIATION: "Does the pain shoot anywhere else?" (e.g., chest, back)     Left side 3. ONSET: "When did the pain begin?" (e.g., minutes, hours or days ago)      6 months 5. PATTERN "Does the pain come and go, or is it constant?"    - If it comes and goes: "How long does it last?" "Do you have pain now?"     (Note: Comes and goes means the pain is intermittent. It goes away completely between bouts.)    - If constant: "Is it getting better, staying the same, or getting worse?"      (Note: Constant means the pain never goes away completely; most serious pain is constant and gets worse.)      comes 6. SEVERITY: "How bad is the pain?"  (e.g., Scale 1-10; mild, moderate, or severe)    - MILD (1-3): Doesn't interfere with normal activities, abdomen soft and not tender to touch.     - MODERATE (4-7): Interferes with normal activities or awakens from sleep, abdomen tender to touch.     - SEVERE (8-10): Excruciating pain, doubled over, unable to do any normal activities.        Laying daiwn only 8ays on stomach 8/10 7. RECURRENT SYMPTOM: "Have you ever had this type of stomach pain before?" If Yes, ask: "When was the last time?" and "What happened that time?"      Yes on  and off for 6 months 8. CAUSE: "What do you think is causing the stomach pain?"     unsure 10. OTHER SYMPTOMS: "Do you have any other symptoms?" (e.g., back pain, diarrhea, fever, urination pain, vomiting)       Nausea, dizzy when pain occurs  Protocols used: Abdominal Pain - [facility]

## 2023-07-28 ENCOUNTER — Ambulatory Visit (INDEPENDENT_AMBULATORY_CARE_PROVIDER_SITE_OTHER): Payer: Medicare HMO | Admitting: Family

## 2023-07-28 ENCOUNTER — Encounter: Payer: Self-pay | Admitting: Family

## 2023-07-28 VITALS — BP 125/78 | HR 65 | Temp 97.9°F | Ht 66.0 in | Wt 236.0 lb

## 2023-07-28 DIAGNOSIS — G8929 Other chronic pain: Secondary | ICD-10-CM

## 2023-07-28 DIAGNOSIS — H9193 Unspecified hearing loss, bilateral: Secondary | ICD-10-CM

## 2023-07-28 DIAGNOSIS — R109 Unspecified abdominal pain: Secondary | ICD-10-CM

## 2023-07-28 LAB — POCT URINALYSIS DIP (CLINITEK)
Bilirubin, UA: NEGATIVE
Blood, UA: NEGATIVE
Glucose, UA: NEGATIVE mg/dL
Leukocytes, UA: NEGATIVE
Nitrite, UA: NEGATIVE
POC PROTEIN,UA: NEGATIVE
Spec Grav, UA: 1.02 (ref 1.010–1.025)
Urobilinogen, UA: 0.2 U/dL
pH, UA: 5.5 (ref 5.0–8.0)

## 2023-07-28 NOTE — Progress Notes (Signed)
Patient ID: Molly Hanson, female    DOB: [birth year]  MRN: [id number]  CC: Abdominal Pain   Subjective: Molly Hanson is a 68 y.o. female who presents for abdominal pain.  Her concerns today include:  - Intermittent abdominal pain x 6 months. Reports pain worse when laying in bed. She denies red flag symptoms.  - Reports bilateral decreased hearing. Right > left. She denies additional symptoms.   Patient Active Problem List   Diagnosis Date Noted   Hx of adenomatous colonic polyps 04/30/2022     Current Outpatient Medications on File Prior to Visit  Medication Sig Dispense Refill   albuterol (VENTOLIN HFA) 108 (90 Base) MCG/ACT inhaler Inhale 2 puffs into the lungs every 6 (six) hours as needed for wheezing or shortness of breath. (Patient not taking: Reported on 08/04/2022)     benzonatate (TESSALON) 100 MG capsule Take 1 capsule (100 mg total) by mouth every 8 (eight) hours. (Patient not taking: Reported on 07/28/2023) 21 capsule 0   cetirizine (ZYRTEC) 10 MG tablet Take 1 tablet (10 mg total) by mouth daily. (Patient not taking: Reported on 06/09/2022) 30 tablet 3   clotrimazole-betamethasone (LOTRISONE) cream Apply 1 Application topically 2 (two) times daily. (Patient not taking: Reported on 07/28/2023) 45 g 1   cyclobenzaprine (FLEXERIL) 5 MG tablet Take 1 tablet (5 mg total) by mouth 3 (three) times daily as needed for muscle spasms. (Patient not taking: Reported on 07/28/2023) 30 tablet 1   fluticasone (FLONASE) 50 MCG/ACT nasal spray Place 2 sprays into both nostrils daily. (Patient not taking: Reported on 06/09/2022) 16 g 3   gabapentin (NEURONTIN) 300 MG capsule Take 1 capsule (300 mg total) by mouth at bedtime. (Patient not taking: Reported on 07/28/2023) 30 capsule 1   predniSONE (DELTASONE) 20 MG tablet Take 2 tablets (40 mg total) by mouth daily. (Patient not taking: Reported on 07/28/2023) 10 tablet 0   promethazine-dextromethorphan (PROMETHAZINE-DM) 6.25-15 MG/5ML syrup Take 5 mLs  by mouth at bedtime as needed for cough. (Patient not taking: Reported on 07/28/2023) 118 mL 0   Vitamin D, Ergocalciferol, (DRISDOL) 1.25 MG (50000 UNIT) CAPS capsule Take 1 capsule (50,000 Units total) by mouth every 7 (seven) days. (Patient not taking: Reported on 07/28/2023) 12 capsule 0   No current facility-administered medications on file prior to visit.    No Known Allergies  Social History   Socioeconomic History   Marital status: Single    Spouse name: Not on file   Number of children: Not on file   Years of education: Not on file   Highest education level: Associate degree: occupational, Scientist, product/process development, or vocational program  Occupational History   Not on file  Tobacco Use   Smoking status: Former    Current packs/day: 0.00    Types: Cigarettes    Quit date: 07/15/2020    Years since quitting: 3.0    Passive exposure: Past   Smokeless tobacco: Never  Vaping Use   Vaping status: Never Used  Substance and Sexual Activity   Alcohol use: Yes   Drug use: Never   Sexual activity: Not on file  Other Topics Concern   Not on file  Social History Narrative   Not on file   Social Determinants of Health   Financial Resource Strain: Patient Declined (06/14/2023)   Overall Financial Resource Strain (CARDIA)    Difficulty of Paying Living Expenses: Patient declined  Food Insecurity: Patient Declined (06/14/2023)   Hunger Vital Sign    Worried  About Running Out of Food in the Last Year: Patient declined    Ran Out of Food in the Last Year: Patient declined  Transportation Needs: No Transportation Needs (06/14/2023)   PRAPARE - Administrator, Civil Service (Medical): No    Lack of Transportation (Non-Medical): No  Physical Activity: Insufficiently Active (06/14/2023)   Exercise Vital Sign    Days of Exercise per Week: 3 days    Minutes of Exercise per Session: 30 min  Stress: No Stress Concern Present (06/14/2023)   [name] - Occupational  Stress Questionnaire    Feeling of Stress : Not at all  Social Connections: Moderately Isolated (06/14/2023)   Social Connection and Isolation Panel [NHANES]    Frequency of Communication with Friends and Family: More than three times a week    Frequency of Social Gatherings with Friends and Family: Twice a week    Attends Religious Services: 1 to 4 times per year    Active Member of [employer] or Organizations: No    Attends Engineer, structural: Not on file    Marital Status: Divorced  Catering manager Violence: Not on file    Family History  Problem Relation Age of Onset   Breast cancer Mother    Diabetes Mother    Diabetes Father    Diabetes Maternal Grandmother    Stroke Maternal Grandmother    Colon polyps Maternal Grandmother    Colon cancer Maternal Grandfather    Diabetes Paternal Grandmother    Colon polyps Maternal Uncle    Esophageal cancer Neg Hx    Rectal cancer Neg Hx    Stomach cancer Neg Hx     Past Surgical History:  Procedure Laterality Date   COLONOSCOPY     > 10 yrs ago- normal     ROS: Review of Systems Negative except as stated above  PHYSICAL EXAM: BP 125/78   Pulse 65   Temp 97.9 F (36.6 C) (Oral)   Ht 5\' 6"  (1.676 m)   Wt 236 lb (107 kg)   SpO2 97%   BMI 38.09 kg/m   Physical Exam HENT:     Head: Normocephalic and atraumatic.     Right Ear: Tympanic membrane, ear canal and external ear normal.     Left Ear: Tympanic membrane, ear canal and external ear normal.     Nose: Nose normal.     Mouth/Throat:     Mouth: Mucous membranes are moist.     Pharynx: Oropharynx is clear.  Eyes:     Extraocular Movements: Extraocular movements intact.     Conjunctiva/sclera: Conjunctivae normal.     Pupils: Pupils are equal, round, and reactive to light.  Cardiovascular:     Rate and Rhythm: Normal rate and regular rhythm.     Pulses: Normal pulses.     Heart sounds: Normal heart sounds.  Pulmonary:     Effort: Pulmonary effort is  normal.     Breath sounds: Normal breath sounds.  Abdominal:     General: Bowel sounds are normal.     Palpations: Abdomen is soft.  Musculoskeletal:        General: Normal range of motion.     Cervical back: Normal range of motion and neck supple.  Neurological:     General: No focal deficit present.     Mental Status: She is alert and oriented to person, place, and time.  Psychiatric:        Mood and  Affect: Mood normal.        Behavior: Behavior normal.      ASSESSMENT AND PLAN: 1. Chronic abdominal pain - Routine screening.  - Referral to Gastroenterology for further evaluation/management. - POCT URINALYSIS DIP (CLIN[facility]); Future - Ambulatory referral to Gastroenterology  2. Decreased hearing of both ears - Referral to ENT for further evaluation/management.  - Ambulatory referral to ENT   Patient was given the opportunity to ask questions.  Patient verbalized understanding of the plan and was able to repeat key elements of the plan. Patient was given clear instructions to go to Emergency Department or return to medical center if symptoms don't improve, worsen, or new problems develop.The patient verbalized understanding.   Orders Placed This Encounter  Procedures   Ambulatory referral to Gastroenterology   Ambulatory referral to ENT   POCT URINALYSIS DIP ([facility]IN[facility])     Return for Follow-Up or next available Georganna Skeans, MD .  Rema Fendt, NP

## 2023-07-28 NOTE — Progress Notes (Signed)
Pt states vitamin D gave vertigo.  Pt wants to set up appointment for ears to get checked. States hearing is different  Pt wants to speak about her abdominal pain.   Pt states dizzyness and nausea in the morning.

## 2023-08-09 ENCOUNTER — Telehealth: Payer: Self-pay | Admitting: Physician Assistant

## 2023-08-09 DIAGNOSIS — R1310 Dysphagia, unspecified: Secondary | ICD-10-CM

## 2023-08-09 NOTE — Telephone Encounter (Signed)
Patient called state she is having chronic abdominal pain and is having a hard time swallowing some foods like bread. Seeking further advise.

## 2023-08-10 DIAGNOSIS — M24572 Contracture, left ankle: Secondary | ICD-10-CM | POA: Diagnosis not present

## 2023-08-10 DIAGNOSIS — M7662 Achilles tendinitis, left leg: Secondary | ICD-10-CM | POA: Diagnosis not present

## 2023-08-10 DIAGNOSIS — M7731 Calcaneal spur, right foot: Secondary | ICD-10-CM | POA: Diagnosis not present

## 2023-08-10 DIAGNOSIS — M7732 Calcaneal spur, left foot: Secondary | ICD-10-CM | POA: Diagnosis not present

## 2023-08-10 DIAGNOSIS — M792 Neuralgia and neuritis, unspecified: Secondary | ICD-10-CM | POA: Diagnosis not present

## 2023-08-10 DIAGNOSIS — M24571 Contracture, right ankle: Secondary | ICD-10-CM | POA: Diagnosis not present

## 2023-08-10 DIAGNOSIS — M722 Plantar fascial fibromatosis: Secondary | ICD-10-CM | POA: Diagnosis not present

## 2023-08-10 NOTE — Telephone Encounter (Signed)
Pt returned call. We reviewed recommendations, pt did not have anything to write with so she requested that I send recommendations via MyChart. Pt knows to expect a call from radiology scheduling to set up barium esophagram appt. Pt verbalized understanding and had no concerns at the end of the call.

## 2023-08-10 NOTE — Telephone Encounter (Signed)
EGD done last year - empiric dilation done but no obvious cause for her symptoms. If dysphagia persists would do a barium swallow with tablet to further evaluate - get sense of motility, see if any interval development of stricture and see if we can localize dysphagia, if you can order. Otherwise recommend she try Citrucel once daily for her bowels and she can try some IB gard OTC to see PRN and see if that helps in the interim. If symptoms persist she will need an office visit to further evaluate. thanks

## 2023-08-10 NOTE — Telephone Encounter (Signed)
Lm on vm for patient to return call.   Barium esophagram order in epic. Secure staff message sent to radiology scheduling to contact patient to set up an appt.

## 2023-08-10 NOTE — Telephone Encounter (Signed)
Called and spoke with patient. Pt reports that for the last couple of months she has been feeling like foods get stuck in her throat but eventually it'll pass. Pt reports that thick items like bread and large meats get stuck. I told pt that she should be avoiding those types of foods, chew food well, takes sips of liquid in between. Pt states that she drinks 5 bottles of water daily. Felt some improvement for a short period after dilation. Patient also mentioned that for the last 68-months she has experienced lower abdominal pain. Pt describes pain as "crampy", gets worse when she lays on her stomach. Pt did state that the pain improves a little after a BM. Pt reports that she has regular BM's, no hard stools, not having to strain, and she feels like she evacuates but may be having some constipation. Pt reports that the only thing that helps her move her bowels is milk or coffee. Pt is not taking any OTC stool softeners. Has not tried Tylenol or heating pad. Reports nausea, but no vomiting, or diarrhea. Please advise, thanks.

## 2023-08-16 ENCOUNTER — Other Ambulatory Visit: Payer: Self-pay | Admitting: Family Medicine

## 2023-08-16 DIAGNOSIS — Z1231 Encounter for screening mammogram for malignant neoplasm of breast: Secondary | ICD-10-CM

## 2023-08-19 ENCOUNTER — Ambulatory Visit (HOSPITAL_COMMUNITY)
Admission: RE | Admit: 2023-08-19 | Discharge: 2023-08-19 | Disposition: A | Discharge status: Home / Self Care | Payer: Medicare HMO | Source: Ambulatory Visit | Attending: Gastroenterology | Admitting: Gastroenterology

## 2023-08-19 DIAGNOSIS — R1310 Dysphagia, unspecified: Secondary | ICD-10-CM | POA: Insufficient documentation

## 2023-08-24 DIAGNOSIS — H25813 Combined forms of age-related cataract, bilateral: Secondary | ICD-10-CM | POA: Diagnosis not present

## 2023-08-30 ENCOUNTER — Ambulatory Visit: Payer: Self-pay | Admitting: *Deleted

## 2023-08-30 NOTE — Telephone Encounter (Signed)
  Chief Complaint: dizziness, nausea, extreme night sweats, dry cough. Symptoms: dizziness standing and sitting, broke out into sweat today drove self home. Did not pass out. Reports possible food poisoning 3-4 weeks ago and took "something" to clean her out . Does drink 5 bottles of water a day.  Frequency: worsening today  Pertinent Negatives: Patient denies chest pain no difficulty breathing no fever Disposition: [] ED /[x] Urgent Care (no appt availability in office) / [] Appointment(In office/virtual)/ []  [facility]/ [] Home Care/ [] Refused Recommended Disposition /[] [facility]/ []  Follow-up with PCP Additional Notes:   Patient requesting appt instead of going to UC. Unsure of disposition . No available appt until 09/22/23. Please advise and patient would like call back.    Summary: Dizziness/nausea   Patient called to see if she could be worked into the schedule tomorrow at [facility]. Patient states that she had food poisoning a few weeks ago and ever since she has been experiencing dizziness, nausea and extreme night sweats. Next available appointment in office was Oct. 21st.                Reason for Disposition  [1] MODERATE dizziness (e.g., interferes with normal activities) AND [2] has NOT been evaluated by doctor (or NP/PA) for this  (Exception: Dizziness caused by heat exposure, sudden standing, or poor fluid intake.)  Answer Assessment - Initial Assessment Questions 1. DESCRIPTION: "Describe your dizziness."     lightheaded 2. LIGHTHEADED: "Do you feel lightheaded?" (e.g., somewhat faint, woozy, weak upon standing)     Woozy dizziness with sitting as well . 3. VERTIGO: "Do you feel like either you or the room is spinning or tilting?" (i.e. vertigo)     Somewhat  4. SEVERITY: "How bad is it?"  "Do you feel like you are going to faint?" "Can you stand and walk?"   - MILD: Feels slightly dizzy, but walking normally.   - MODERATE: Feels unsteady when  walking, but not falling; interferes with normal activities (e.g., school, work).   - SEVERE: Unable to walk without falling, or requires assistance to walk without falling; feels like passing out now.      Moderate unsteady  5. ONSET:  "When did the dizziness begin?"     3-4 weeks ago but worse today 6. AGGRAVATING FACTORS: "Does anything make it worse?" (e.g., standing, change in head position)     Any movement  7. HEART RATE: "Can you tell me your heart rate?" "How many beats in 15 seconds?"  (Note: not all patients can do this)       Na  8. CAUSE: "What do you think is causing the dizziness?"     Not sure  9. RECURRENT SYMPTOM: "Have you had dizziness before?" If Yes, ask: "When was the last time?" "What happened that time?"     No  10. OTHER SYMPTOMS: "Do you have any other symptoms?" (e.g., fever, chest pain, vomiting, diarrhea, bleeding)       Dizziness, nausea . Pain right low abdomen moves to left side , night sweats  11. PREGNANCY: "Is there any chance you are pregnant?" "When was your last menstrual period?"       na  Protocols used: Dizziness - Lightheadedness-A-AH

## 2023-08-31 ENCOUNTER — Other Ambulatory Visit: Payer: Self-pay

## 2023-08-31 ENCOUNTER — Emergency Department (HOSPITAL_COMMUNITY)
Admission: EM | Admit: 2023-08-31 | Discharge: 2023-08-31 | Disposition: A | Discharge status: Home / Self Care | Payer: Medicare HMO | Attending: Emergency Medicine | Admitting: Emergency Medicine

## 2023-08-31 ENCOUNTER — Ambulatory Visit
Admission: EM | Admit: 2023-08-31 | Discharge: 2023-08-31 | Disposition: A | Discharge status: Home / Self Care | Payer: Medicare HMO | Attending: Physician Assistant | Admitting: Physician Assistant

## 2023-08-31 ENCOUNTER — Encounter (HOSPITAL_COMMUNITY): Payer: Self-pay

## 2023-08-31 ENCOUNTER — Emergency Department (HOSPITAL_COMMUNITY): Payer: Medicare HMO

## 2023-08-31 DIAGNOSIS — R112 Nausea with vomiting, unspecified: Secondary | ICD-10-CM | POA: Diagnosis not present

## 2023-08-31 DIAGNOSIS — R42 Dizziness and giddiness: Secondary | ICD-10-CM

## 2023-08-31 DIAGNOSIS — R109 Unspecified abdominal pain: Secondary | ICD-10-CM | POA: Insufficient documentation

## 2023-08-31 DIAGNOSIS — R110 Nausea: Secondary | ICD-10-CM | POA: Insufficient documentation

## 2023-08-31 DIAGNOSIS — R000 Tachycardia, unspecified: Secondary | ICD-10-CM | POA: Diagnosis not present

## 2023-08-31 DIAGNOSIS — K5730 Diverticulosis of large intestine without perforation or abscess without bleeding: Secondary | ICD-10-CM | POA: Diagnosis not present

## 2023-08-31 DIAGNOSIS — R29818 Other symptoms and signs involving the nervous system: Secondary | ICD-10-CM | POA: Diagnosis not present

## 2023-08-31 LAB — CBC
HCT: 47 % — ABNORMAL HIGH (ref 36.0–46.0)
Hemoglobin: 14.8 g/dL (ref 12.0–15.0)
MCH: 28.8 pg (ref 26.0–34.0)
MCHC: 31.5 g/dL (ref 30.0–36.0)
MCV: 91.4 fL (ref 80.0–100.0)
Platelets: 258 10*3/uL (ref 150–400)
RBC: 5.14 MIL/uL — ABNORMAL HIGH (ref 3.87–5.11)
RDW: 13.2 % (ref 11.5–15.5)
WBC: 7.9 10*3/uL (ref 4.0–10.5)
nRBC: 0 % (ref 0.0–0.2)

## 2023-08-31 LAB — COMPREHENSIVE METABOLIC PANEL
ALT: 17 U/L (ref 0–44)
AST: 17 U/L (ref 15–41)
Albumin: 4.2 g/dL (ref 3.5–5.0)
Alkaline Phosphatase: 62 U/L (ref 38–126)
Anion gap: 7 (ref 5–15)
BUN: 14 mg/dL (ref 8–23)
CO2: 27 mmol/L (ref 22–32)
Calcium: 9 mg/dL (ref 8.9–10.3)
Chloride: 103 mmol/L (ref 98–111)
Creatinine, Ser: 0.92 mg/dL (ref 0.44–1.00)
GFR, Estimated: >60 mL/min (ref >60)
Glucose, Bld: 87 mg/dL (ref 70–99)
Potassium: 4 mmol/L (ref 3.5–5.1)
Sodium: 137 mmol/L (ref 135–145)
Total Bilirubin: 0.8 mg/dL (ref 0.3–1.2)
Total Protein: 7.7 g/dL (ref 6.5–8.1)

## 2023-08-31 LAB — URINALYSIS, ROUTINE W REFLEX MICROSCOPIC
Bilirubin Urine: NEGATIVE
Glucose, UA: NEGATIVE mg/dL
Hgb urine dipstick: NEGATIVE
Ketones, ur: NEGATIVE mg/dL
Leukocytes,Ua: NEGATIVE
Nitrite: NEGATIVE
Protein, ur: NEGATIVE mg/dL
Specific Gravity, Urine: 1.02 (ref 1.005–1.030)
pH: 6 (ref 5.0–8.0)

## 2023-08-31 LAB — POCT FASTING CBG KUC MANUAL ENTRY: POCT Glucose (KUC): 174 mg/dL — AB (ref 70–99)

## 2023-08-31 LAB — LIPASE, BLOOD: Lipase: 34 U/L (ref 11–51)

## 2023-08-31 MED ORDER — IOHEXOL 300 MG/ML  SOLN
100.0000 mL | Freq: Once | INTRAMUSCULAR | Status: AC | PRN
  Administered 2023-08-31: 100 mL via INTRAVENOUS

## 2023-08-31 MED ORDER — ONDANSETRON HCL 4 MG/2ML IJ SOLN
4.0000 mg | Freq: Once | INTRAMUSCULAR | Status: AC
  Administered 2023-08-31: 4 mg via INTRAVENOUS
  Filled 2023-08-31: qty 2

## 2023-08-31 MED ORDER — SODIUM CHLORIDE 0.9 % IV BOLUS
1000.0000 mL | Freq: Once | INTRAVENOUS | Status: AC
  Administered 2023-08-31: 1000 mL via INTRAVENOUS

## 2023-08-31 MED ORDER — FAMOTIDINE IN NACL 20-0.9 MG/50ML-% IV SOLN
20.0000 mg | Freq: Once | INTRAVENOUS | Status: AC
  Administered 2023-08-31: 20 mg via INTRAVENOUS
  Filled 2023-08-31: qty 50

## 2023-08-31 MED ORDER — ONDANSETRON HCL 4 MG PO TABS: 4.0000 mg | ORAL_TABLET | Freq: Four times a day (QID) | ORAL | 1 refills | Status: AC | PRN

## 2023-08-31 NOTE — ED Triage Notes (Signed)
"  I had food poisoning a few wks ago, Since then I have nausea and dizziness". The dizziness "is getting really bad". "I am having to sit down a lot and breaking out in sweats". Current episode in intake (during questions). No chest pain. No sob. No palpitations.

## 2023-08-31 NOTE — ED Provider Notes (Signed)
[name]    CSN: [id number] Arrival date & time: 08/31/23  4098      History   Chief Complaint Chief Complaint  Patient presents with   Dizziness   Nausea    HPI Molly Hanson is a 67 y.o. female.   Patient here today for evaluation of dizziness and lightheadedness that is been worsening over the last few weeks.  She reports that she had food poisoning about 3 to 4 weeks ago and symptoms started soon after this.  She states the dizziness is worsening.  She is having to sit down a lot and has been breaking out in sweats.  She denies any chest pain or shortness of breath.  She has not any palpitations.   The history is provided by the patient.  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      Home Medications    Prior to Admission medications   Medication Sig Start Date End Date Taking? Authorizing Provider  albuterol (VENTOLIN HFA) 108 (90 Base) MCG/ACT inhaler Inhale 2 puffs into the lungs every 6 (six) hours as needed for wheezing or shortness of breath. Patient not taking: Reported on 08/04/2022    [provider]  benzonatate (TESSALON) 100 MG capsule Take 1 capsule (100 mg total) by mouth every 8 (eight) hours. Patient not taking: Reported on 07/28/2023 01/23/23   Valinda Hoar, NP  cetirizine (ZYRTEC) 10 MG tablet Take 1 tablet (10 mg total) by mouth  daily. Patient not taking: Reported on 06/09/2022 06/03/22   Georganna Skeans, MD  clotrimazole-betamethasone (LOTRISONE) cream Apply 1 Application topically 2 (two) times daily. Patient not taking: Reported on 07/28/2023 05/12/23   Georganna Skeans, MD  cyclobenzaprine (FLEXERIL) 5 MG tablet Take 1 tablet (5 mg total) by mouth 3 (three) times daily as needed for muscle spasms. Patient not taking: Reported on 07/28/2023 08/20/22   Georganna Skeans, MD  fluticasone [facility]) 50 MCG/ACT nasal spray Place 2 sprays into both nostrils daily. Patient not taking: Reported on 06/09/2022 06/03/22   Georganna Skeans, MD  gabapentin (NEURONTIN) 300 MG capsule Take 1 capsule (300 mg total) by mouth at bedtime. Patient not taking: Reported on 07/28/2023 05/26/23   Rema Fendt, NP  predniSONE (DELTASONE) 20 MG tablet Take 2 tablets (40 mg total) by mouth daily. Patient not taking: Reported on 07/28/2023 01/23/23   Valinda Hoar, NP  promethazine-dextromethorphan (PROMETHAZINE-DM) 6.25-15 MG/5ML syrup Take 5 mLs by mouth at bedtime as needed for cough. Patient not taking: Reported on 07/28/2023 01/23/23   Valinda Hoar, NP  Vitamin D, Ergocalciferol, (DRISDOL) 1.25 MG (50000 UNIT) CAPS capsule Take 1 capsule (50,000 Units total) by mouth every 7 (seven) days. Patient not taking: Reported on 07/28/2023 06/21/23   Georganna Skeans,  MD    Family History Family History  Problem Relation Age of Onset   Breast cancer Mother    Diabetes Mother    Diabetes Father    Diabetes Maternal Grandmother    Stroke Maternal Grandmother    Colon polyps Maternal Grandmother    Colon cancer Maternal Grandfather    Diabetes Paternal Grandmother    Colon polyps Maternal Uncle    Esophageal cancer Neg Hx    Rectal cancer Neg Hx    Stomach cancer Neg Hx     Social History Social History   Tobacco Use   Smoking status: Former    Current packs/day: 0.00    Types: Cigarettes    Quit date: 07/15/2020    Years since quitting: 3.1    Passive  exposure: Past   Smokeless tobacco: Never  Vaping Use   Vaping status: Never Used  Substance Use Topics   Alcohol use: Yes    Comment: Occassionally.   Drug use: Never     Allergies   Patient has no known allergies.   Review of Systems Review of Systems  Constitutional:  Negative for chills and fever.  Eyes:  Negative for discharge and redness.  Respiratory:  Negative for shortness of breath.   Cardiovascular:  Negative for chest pain.  Gastrointestinal:  Negative for abdominal pain, nausea and vomiting.  Neurological:  Positive for dizziness and light-headedness.     Physical Exam Triage Vital Signs ED Triage Vitals  Encounter Vitals Group     BP --      Systolic BP Percentile --      Diastolic BP Percentile --      Pulse Rate 08/31/23 0851 72     Resp 08/31/23 0851 20     Temp 08/31/23 0851 98.8 F (37.1 C)     Temp Source 08/31/23 0851 Oral     SpO2 08/31/23 0851 96 %     Weight 08/31/23 0846 235 lb 14.3 oz (107 kg)     Height 08/31/23 0846 5\' 6"  (1.676 m)     Head Circumference --      Peak Flow --      Pain Score 08/31/23 0846 0     Pain Loc --      Pain Education --      Exclude from Growth Chart --    Orthostatic VS for the past 24 hrs:  BP- Lying Pulse- Lying BP- Standing at 0 minutes Pulse- Standing at 0 minutes  08/31/23 0854 132/84 66 144/84 75    Updated Vital Signs Pulse 72   Temp 98.8 F (37.1 C) (Oral)   Resp 20   Ht 5\' 6"  (1.676 m)   Wt 235 lb 14.3 oz (107 kg)   SpO2 96%   BMI 38.07 kg/m      Physical Exam Vitals and nursing note reviewed.  Constitutional:      General: She is not in acute distress.    Appearance: Normal appearance. She is not ill-appearing.  HENT:     Head: Normocephalic and atraumatic.  Eyes:     Conjunctiva/sclera: Conjunctivae normal.  Cardiovascular:     Rate and Rhythm: Normal rate and regular rhythm.  Pulmonary:     Effort: Pulmonary effort is normal. No respiratory distress.     Breath sounds:  Normal breath sounds. No wheezing, rhonchi or rales.  Neurological:     Mental Status: She is alert.  Psychiatric:        Mood and Affect: Mood  normal.        Behavior: Behavior normal.        Thought Content: Thought content normal.      UC Treatments / Results  Labs (all labs ordered are listed, but only abnormal results are displayed) Labs Reviewed  POCT FASTING CBG [facility] MANUAL ENTRY - Abnormal; Notable for the following components:      Result Value   POCT Glucose ([facility]) 174 (*)    All other components within normal limits    EKG   Radiology No results found.  Procedures Procedures (including critical care time)  Medications Ordered in UC Medications - No data to display  Initial Impression / Assessment and Plan / UC Course  I have reviewed the triage vital signs and the nursing notes.  Pertinent labs & imaging results that were available during my care of the patient were reviewed by me and considered in my medical decision making (see chart for details).    Recommended further evaluation in the emergency room for stat labs.  Patient is agreeable to same.  EKG appears similar to prior.  Patient is to transport POV and states that she feels safe to do so.  Final Clinical Impressions(s) / UC Diagnoses   Final diagnoses:  Dizziness  Lightheadedness   Discharge Instructions   None    ED Prescriptions   None    PDMP not reviewed this encounter.   Tomi Bamberger, PA-C 08/31/23 [id number]

## 2023-08-31 NOTE — ED Provider Notes (Signed)
[place] EMERGENCY DEPARTMENT AT [facility] Provider Note   CSN: [id number] Arrival date & time: 08/31/23  4401     History  Chief Complaint  Patient presents with   Nausea    Molly Hanson is a 67 y.o. female.  HPI  This 67 year old female with a history of prediabetes, denting to the ED due to concerns for nausea and abdominal pain that has been going on for the past 4 w..  Patient is reporting associated dizziness and night sweats. However denies any chest pain shortness of breath or changes in bladder and bowel function. She denies any cough or unintentional weight loss she has not traveled outside [place] for the past 6 years. Of note, patient reported that she had food poisoning a couple of weeks ago but the GI panel came back unremarkable.  Home Medications Prior to Admission medications   Medication Sig Start Date End Date Taking? Authorizing Provider  albuterol (VENTOLIN HFA) 108 (90 Base) MCG/ACT inhaler Inhale 2 puffs into the lungs every 6 (six) hours as needed for wheezing or shortness of breath. Patient not taking: Reported on 08/04/2022    [provider]  benzonatate (TESSALON) 100 MG capsule Take 1 capsule (100 mg total) by mouth every 8 (eight) hours. Patient not taking: Reported on 07/28/2023 01/23/23   Valinda Hoar, NP  cetirizine (ZYRTEC) 10 MG tablet Take 1 tablet (10 mg total) by mouth daily. Patient not taking: Reported on 06/09/2022 06/03/22   Georganna Skeans, MD  clotrimazole-betamethasone (LOTRISONE) cream Apply 1 Application topically 2 (two) times daily. Patient not taking: Reported on 07/28/2023 05/12/23   Georganna Skeans, MD  cyclobenzaprine (FLEXERIL) 5 MG tablet Take 1 tablet (5 mg total) by mouth 3 (three) times daily as needed for muscle spasms. Patient not taking: Reported on 07/28/2023 08/20/22   Georganna Skeans, MD  fluticasone [facility]) 50 MCG/ACT nasal spray Place 2 sprays into both nostrils daily. Patient not taking:  Reported on 06/09/2022 06/03/22   Georganna Skeans, MD  gabapentin (NEURONTIN) 300 MG capsule Take 1 capsule (300 mg total) by mouth at bedtime. Patient not taking: Reported on 07/28/2023 05/26/23   Rema Fendt, NP  predniSONE (DELTASONE) 20 MG tablet Take 2 tablets (40 mg total) by mouth daily. Patient not taking: Reported on 07/28/2023 01/23/23   Valinda Hoar, NP  promethazine-dextromethorphan (PROMETHAZINE-DM) 6.25-15 MG/5ML syrup Take 5 mLs by mouth at bedtime as needed for cough. Patient not taking: Reported on 07/28/2023 01/23/23   Valinda Hoar, NP  Vitamin D, Ergocalciferol, (DRISDOL) 1.25 MG (50000 UNIT) CAPS capsule Take 1 capsule (50,000 Units total) by mouth every 7 (seven) days. Patient not taking: Reported on 07/28/2023 06/21/23   Georganna Skeans, MD      Allergies    Patient has no known allergies.    Review of Systems   Review of Systems  Constitutional:  Negative for chills, diaphoresis, fatigue and fever.  Gastrointestinal:  Positive for abdominal pain and nausea. Negative for diarrhea and vomiting.  Genitourinary:  Negative for pelvic pain.    Physical Exam Updated Vital Signs BP (!) 148/96 (BP Location: Right Arm)   Pulse (!) 55   Temp (!) 97.4 F (36.3 C) (Oral)   Resp 18   Ht 5\' 6"  (1.676 m)   Wt 107 kg   SpO2 100%   BMI 38.07 kg/m   Physical Exam Constitutional:      General: She is not in acute distress.    Appearance: She  is obese. She is ill-appearing. She is not toxic-appearing or diaphoretic.  Cardiovascular:     Rate and Rhythm: Tachycardia present.  Abdominal:     General: Abdomen is flat. There is no distension.     Palpations: There is no mass.     Tenderness: There is no right CVA tenderness, left CVA tenderness, guarding or rebound.  Neurological:     Mental Status: She is alert.     ED Results / Procedures / Treatments   Labs (all labs ordered are listed, but only abnormal results are displayed) Labs Reviewed  CBC - Abnormal; Notable  for the following components:      Result Value   RBC 5.14 (*)    HCT 47.0 (*)    All other components within normal limits  LIPASE, BLOOD  COMPREHENSIVE METABOLIC PANEL  URINALYSIS, ROUTINE W REFLEX MICROSCOPIC    EKG None  Radiology No results found.  Procedures Procedures    Medications Ordered in ED Medications - No data to display  ED Course/ Medical Decision Making/ A&P                                 Medical Decision Making Amount and/or Complexity of Data Reviewed Labs: ordered.   This patient is a 67 y.o. female who presents to the ED for concern of  Nausea, this involves an extensive number of treatment options, and is a complaint that carries with it a high risk of complications and morbidity. The emergent differential diagnosis prior to evaluation includes, but is not limited to, nephrolithiasis,Gastritis,Anemia ,GERD,PUD,Pancreatis. Less likely anemia, her CBC came back unremarkable. 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 Small sliding-type hiatal hernia which could be contributing to her nausea.CT Abdomen and Head was unremarkable  Physical Exam: Physical exam performed. The pertinent findings include: Soft abdomen, soft, non tender, no guarding   Lab Tests: I ordered, and personally interpreted labs.  The pertinent results include:  CBC, CMP, Lipase . Lipase came back WNL.   Imaging Studies: I ordered imaging studies including CT Head without Contrast, CT abdomen with Contrast. I independently visualized and interpreted imaging. CT abdomen didn't show any acute pathology. I agree with the radiologist interpretation.  Medications: I ordered medication including Pepcid, Zofran for nausea. Reevaluation of the patient after these medicines showed that the patient improved. I have reviewed the patients home medicines and have made adjustments as needed.   Disposition: After  consideration of the diagnostic results and the patients response to treatment, I feel that  . emergency department workup does not suggest an emergent condition requiring admission or immediate intervention beyond what has been performed at this time. The plan is discharge with Zofran. The patient is safe for discharge and has been instructed to return immediately for worsening symptoms, change in symptoms or any other concerns.  I discussed this case with my attending physician Dr. Silverio Lay  who cosigned this note including patient's presenting symptoms, physical exam, and planned diagnostics and interventions. Attending physician stated agreement with plan or made changes to plan which were implemented.           Final Clinical Impression(s) / ED Diagnoses Final diagnoses:  None    Rx / DC Orders ED Discharge Orders     None         Kathleen Lime, MD 08/31/23 Molly Hanson    Charlynne Pander, MD  09/06/23 2351  

## 2023-08-31 NOTE — ED Notes (Signed)
Patient is being discharged from the Urgent Care and sent to the Emergency Department via Private Vehicle (Self if advised) or Family Member. Per R. Izola Price pa-c, patient is in need of higher level of care due to Current symptoms, Higher acuity of care needed (ED). Patient is aware and verbalizes understanding of plan of care.  Vitals:   08/31/23 0851  Pulse: 72  Resp: 20  Temp: 98.8 F (37.1 C)  SpO2: 96%

## 2023-08-31 NOTE — Discharge Instructions (Signed)
Dear Ms Acheampong It was a pleasure taking care of you at [facility] ED.We are discharging you home now that you are doing better. Please follow the following instructions.  1) Kindly pick up Zofran from the pharmacy and take them as needed for your nausea.   2)Return immediately for worsening symptoms, change in symptoms or any other concerns.  Take care,  Dr. Kathleen Lime, MD

## 2023-08-31 NOTE — ED Triage Notes (Signed)
Patient stated she has been nauseous and dizzy for 2 weeks. Stated yesterday she broke out in a sweat. Denies chest pain or shortness of breath. Nausea worsens in the morning.

## 2023-09-07 DIAGNOSIS — M7732 Calcaneal spur, left foot: Secondary | ICD-10-CM | POA: Diagnosis not present

## 2023-09-07 DIAGNOSIS — M7662 Achilles tendinitis, left leg: Secondary | ICD-10-CM | POA: Diagnosis not present

## 2023-09-07 DIAGNOSIS — M722 Plantar fascial fibromatosis: Secondary | ICD-10-CM | POA: Diagnosis not present

## 2023-09-07 DIAGNOSIS — M7731 Calcaneal spur, right foot: Secondary | ICD-10-CM | POA: Diagnosis not present

## 2023-09-07 DIAGNOSIS — M792 Neuralgia and neuritis, unspecified: Secondary | ICD-10-CM | POA: Diagnosis not present

## 2023-09-07 DIAGNOSIS — M24572 Contracture, left ankle: Secondary | ICD-10-CM | POA: Diagnosis not present

## 2023-09-07 DIAGNOSIS — M24571 Contracture, right ankle: Secondary | ICD-10-CM | POA: Diagnosis not present

## 2023-09-09 DIAGNOSIS — H93293 Other abnormal auditory perceptions, bilateral: Secondary | ICD-10-CM | POA: Diagnosis not present

## 2023-09-09 DIAGNOSIS — R2689 Other abnormalities of gait and mobility: Secondary | ICD-10-CM | POA: Insufficient documentation

## 2023-09-20 ENCOUNTER — Ambulatory Visit
Admission: RE | Admit: 2023-09-20 | Discharge: 2023-09-20 | Disposition: A | Discharge status: Home / Self Care | Payer: Medicare HMO | Source: Ambulatory Visit | Attending: Family Medicine | Admitting: Family Medicine

## 2023-09-20 DIAGNOSIS — Z1231 Encounter for screening mammogram for malignant neoplasm of breast: Secondary | ICD-10-CM

## 2023-09-24 DIAGNOSIS — H25043 Posterior subcapsular polar age-related cataract, bilateral: Secondary | ICD-10-CM | POA: Diagnosis not present

## 2023-09-24 DIAGNOSIS — H2511 Age-related nuclear cataract, right eye: Secondary | ICD-10-CM | POA: Diagnosis not present

## 2023-09-24 DIAGNOSIS — H25013 Cortical age-related cataract, bilateral: Secondary | ICD-10-CM | POA: Diagnosis not present

## 2023-09-24 DIAGNOSIS — H2513 Age-related nuclear cataract, bilateral: Secondary | ICD-10-CM | POA: Diagnosis not present

## 2023-09-28 DIAGNOSIS — M7662 Achilles tendinitis, left leg: Secondary | ICD-10-CM | POA: Diagnosis not present

## 2023-09-28 DIAGNOSIS — M24572 Contracture, left ankle: Secondary | ICD-10-CM | POA: Diagnosis not present

## 2023-09-28 DIAGNOSIS — M24571 Contracture, right ankle: Secondary | ICD-10-CM | POA: Diagnosis not present

## 2023-09-28 DIAGNOSIS — M722 Plantar fascial fibromatosis: Secondary | ICD-10-CM | POA: Diagnosis not present

## 2023-09-28 DIAGNOSIS — M7732 Calcaneal spur, left foot: Secondary | ICD-10-CM | POA: Diagnosis not present

## 2023-09-28 DIAGNOSIS — M792 Neuralgia and neuritis, unspecified: Secondary | ICD-10-CM | POA: Diagnosis not present

## 2023-09-28 DIAGNOSIS — M7731 Calcaneal spur, right foot: Secondary | ICD-10-CM | POA: Diagnosis not present

## 2023-09-30 ENCOUNTER — Telehealth: Payer: Self-pay

## 2023-09-30 NOTE — Telephone Encounter (Signed)
Transition Care Management Unsuccessful Follow-up Telephone Call  Date of discharge and from where:  08/31/2023 [facility]  Attempts:  1st Attempt  Reason for unsuccessful TCM follow-up call:  No answer/busy  Tayanna Talford [facility]  [facility] Resource Care Guide Direct Dial: [phone]  Website: [email or web address]

## 2023-10-01 ENCOUNTER — Telehealth: Payer: Self-pay

## 2023-10-01 NOTE — Telephone Encounter (Signed)
Transition Care Management Unsuccessful Follow-up Telephone Call  Date of discharge and from where:  08/31/2023 [facility]  Attempts:  2nd Attempt  Reason for unsuccessful TCM follow-up call:  Left voice message  Tyrika Newman [facility]  [facility] Resource Care Guide Direct Dial: [phone]  Website: [email or web address]

## 2023-11-11 ENCOUNTER — Ambulatory Visit
Admission: EM | Admit: 2023-11-11 | Discharge: 2023-11-11 | Disposition: A | Discharge status: Home / Self Care | Payer: Medicare HMO | Attending: Family Medicine | Admitting: Family Medicine

## 2023-11-11 ENCOUNTER — Ambulatory Visit (INDEPENDENT_AMBULATORY_CARE_PROVIDER_SITE_OTHER): Payer: Medicare HMO

## 2023-11-11 ENCOUNTER — Encounter: Payer: Self-pay | Admitting: *Deleted

## 2023-11-11 ENCOUNTER — Other Ambulatory Visit: Payer: Self-pay

## 2023-11-11 ENCOUNTER — Telehealth: Payer: Self-pay | Admitting: Family Medicine

## 2023-11-11 DIAGNOSIS — R1030 Lower abdominal pain, unspecified: Secondary | ICD-10-CM

## 2023-11-11 LAB — POCT URINALYSIS DIP (MANUAL ENTRY)
Bilirubin, UA: NEGATIVE
Blood, UA: NEGATIVE
Glucose, UA: NEGATIVE mg/dL
Ketones, POC UA: NEGATIVE mg/dL
Leukocytes, UA: NEGATIVE
Nitrite, UA: NEGATIVE
Protein Ur, POC: NEGATIVE mg/dL
Spec Grav, UA: 1.025 (ref 1.010–1.025)
Urobilinogen, UA: 0.2 U/dL
pH, UA: 5.5 (ref 5.0–8.0)

## 2023-11-11 NOTE — Telephone Encounter (Signed)
Patient is a requesting a referral to an OB/GYN

## 2023-11-11 NOTE — Discharge Instructions (Signed)
You have been seen today for abdominal pain. Your evaluation was not suggestive of any emergent condition requiring medical intervention at this time. However, some abdominal problems make take more time to appear. Therefore, it is very important for you to pay attention to any new symptoms or worsening of your current condition.  Please return here or to the Emergency Department immediately should you begin to feel worse in any way or have any of the following symptoms: increasing or different abdominal pain, persistent vomiting, inability to drink fluids, fevers, or shaking chills.   

## 2023-11-11 NOTE — ED Triage Notes (Signed)
Pt reports lower abdominal pain"cramps" "goes all the way around". States she has discomfort when using the bathroom "like something is pushing on it". But reports "a little relief" after she has a BM. This has been going on > 6 months and causes her to have trouble sleeping. She saw her PCP earlier this year for same Sx but "it wasn't as bad".

## 2023-11-11 NOTE — ED Provider Notes (Signed)
[facility]   [id number] 11/11/23 Arrival Time: 0949  ASSESSMENT & PLAN:  1. Lower abdominal pain    Unclear cause. Has seen GI and PCP. Benign abdominal exam. No indications for urgent abdominal/pelvic imaging at this time.  Ques GYN related though she has had a hysterectomy. Discussed. Pt to call PCP re: gyn referral. U/A normal. Labs 08/2023 unremarkable. Pt does not wish to repeat these at this time.    Discharge Instructions      You have been seen today for abdominal pain. Your evaluation was not suggestive of any emergent condition requiring medical intervention at this time. However, some abdominal problems make take more time to appear. Therefore, it is very important for you to pay attention to any new symptoms or worsening of your current condition.  Please return here or to the Emergency Department immediately should you begin to feel worse in any way or have any of the following symptoms: increasing or different abdominal pain, persistent vomiting, inability to drink fluids, fevers, or shaking chills.       Follow-up Information     Schedule an appointment as soon as possible for a visit  with Georganna Skeans, MD.   Specialty: Family Medicine Why: For follow up. Contact information: [address] [place] [address] [phone]         [facility] Emergency Department at [facility].   Specialty: Emergency Medicine Why: If symptoms worsen in any way. Contact information: [address] [place] [address] [phone]                Reviewed expectations re: course of current medical issues. Questions answered. Outlined signs and symptoms indicating need for more acute intervention. Patient verbalized understanding. After Visit Summary given.   SUBJECTIVE: History from: patient. Molly Hanson is a 67 y.o. female who presents with complaint of chronic lower abdominal pain described as  "cramps". Start in RLQ then radiate to LLQ. Reports occasional "pressure feeling" associated with urination. Occasional constipation; last BM yesterday. Overall symptoms > 6 mos. CT in 10/24 done and reviewed by me in addition to lab work. Colonoscopy in 2022 with polyps; she is supposed to repeat in 5 years. Denies weight changes. Appetite normal.  No LMP recorded. Patient has had a hysterectomy. Past Surgical History:  Procedure Laterality Date   ABDOMINAL HYSTERECTOMY     COLONOSCOPY     > 10 yrs ago- normal      OBJECTIVE:  Vitals:   11/11/23 1109  BP: 110/76  Pulse: 61  Resp: 16  Temp: 97.9 F (36.6 C)  TempSrc: Oral  SpO2: 96%    General appearance: alert, oriented, no acute distress HEENT: [place]; AT; oropharynx moist Lungs: unlabored respirations Abdomen: soft; without distention; no specific tenderness to palpation; normal bowel sounds; without masses or organomegaly; without guarding or rebound tenderness Back: without reported CVA tenderness; FROM at waist Extremities: without LE edema; symmetrical; without gross deformities Skin: warm and dry Neurologic: normal gait Psychological: alert and cooperative; normal mood and affect  Labs: Results for orders placed or performed during the hospital encounter of 11/11/23  POCT urinalysis dipstick   Collection Time: 11/11/23 12:01 PM  Result Value Ref Range   Color, UA yellow yellow   Clarity, UA clear clear   Glucose, UA negative negative mg/dL   Bilirubin, UA negative negative   Ketones, POC UA negative negative mg/dL   Spec Grav, UA 5.284 1.324 - 1.025   Blood,  UA negative negative   pH, UA 5.5 5.0 - 8.0   Protein Ur, POC negative negative mg/dL   Urobilinogen, UA 0.2 0.2 or 1.0 E.U./dL   Nitrite, UA Negative Negative   Leukocytes, UA Negative Negative   Labs Reviewed  POCT URINALYSIS DIP (MANUAL ENTRY)    Imaging: No results found.   No Known Allergies                                             Past  Medical History:  Diagnosis Date   Arthritis    knees, arm    Hyperlipidemia    has been elevated in the past - no meds    Vitamin D deficiency     Social History   Socioeconomic History   Marital status: Single    Spouse name: Not on file   Number of children: Not on file   Years of education: Not on file   Highest education level: Associate degree: occupational, Scientist, product/process development, or vocational program  Occupational History   Not on file  Tobacco Use   Smoking status: Former    Current packs/day: 0.00    Types: Cigarettes    Quit date: 07/15/2020    Years since quitting: 3.3    Passive exposure: Past   Smokeless tobacco: Never  Vaping Use   Vaping status: Never Used  Substance and Sexual Activity   Alcohol use: Yes    Comment: Occassionally.   Drug use: Never   Sexual activity: Not Currently  Other Topics Concern   Not on file  Social History Narrative   Not on file   Social Drivers of Health   Financial Resource Strain: Patient Declined (06/14/2023)   Overall Financial Resource Strain (CARDIA)    Difficulty of Paying Living Expenses: Patient declined  Food Insecurity: Low Risk  (09/09/2023)   Received from [facility]   Hunger Vital Sign    Worried About Running Out of Food in the Last Year: Never true    Ran Out of Food in the Last Year: Never true  Transportation Needs: No Transportation Needs (09/09/2023)   Received from [facility]    In the past 12 months, has lack of reliable transportation kept you from medical appointments, meetings, work or from getting things needed for daily living? : No  Physical Activity: Insufficiently Active (06/14/2023)   Exercise Vital Sign    Days of Exercise per Week: 3 days    Minutes of Exercise per Session: 30 min  Stress: No Stress Concern Present (06/14/2023)   [name] - Occupational Stress Questionnaire    Feeling of Stress : Not at all  Social Connections: Moderately  Isolated (06/14/2023)   Social Connection and Isolation Panel [NHANES]    Frequency of Communication with Friends and Family: More than three times a week    Frequency of Social Gatherings with Friends and Family: Twice a week    Attends Religious Services: 1 to 4 times per year    Active Member of [employer] or Organizations: No    Attends Engineer, structural: Not on file    Marital Status: Divorced  Catering manager Violence: Not on file    Family History  Problem Relation Age of Onset   Breast cancer Mother    Diabetes Mother    Diabetes Father  Diabetes Maternal Grandmother    Stroke Maternal Grandmother    Colon polyps Maternal Grandmother    Colon cancer Maternal Grandfather    Diabetes Paternal Grandmother    Colon polyps Maternal Uncle    Esophageal cancer Neg Hx    Rectal cancer Neg Hx    Stomach cancer Neg Hx      Mardella Layman, MD 11/11/23 1231

## 2023-11-15 ENCOUNTER — Other Ambulatory Visit: Payer: Self-pay | Admitting: Family Medicine

## 2023-11-15 DIAGNOSIS — R102 Pelvic and perineal pain: Secondary | ICD-10-CM

## 2023-11-18 ENCOUNTER — Telehealth: Payer: Self-pay | Admitting: Family Medicine

## 2023-11-18 ENCOUNTER — Ambulatory Visit: Payer: Self-pay | Admitting: Family Medicine

## 2023-11-18 NOTE — Telephone Encounter (Signed)
Copied from CRM [id number]. Topic: Referral - Request for Referral >> Nov 18, 2023 11:16 AM Patsy Lager T wrote: Did the patient discuss referral with their provider in the last year? Yes  Appointment offered? No  Type of order/referral and detailed reason for visit: Ultrasound..pelvic pain  Preference of office, provider, location: Dr Olivia Mackie [phone]  If referral order, have you been seen by this specialty before? No (If Yes, this issue or another issue? When? Where?  Can we respond through MyChart? Yes  [facility] before could not get her in until Feb and that was too long for her

## 2023-11-18 NOTE — Telephone Encounter (Signed)
Chief Complaint: RLQ increasing over the last 6 months Symptoms: pain, pressure with full bladder Frequency: 6 months Pertinent Negatives: Patient denies fever, denies diarrhea, denies pain with urination,  Disposition: [] ED /[] Urgent Care (no appt availability in office) / [x] Appointment(In office/virtual)/ []  [facility]/ [] Home Care/ [] Refused Recommended Disposition /[] [facility]/ []  Follow-up with PCP Additional Notes: Pt sched for new pt appt as she is not with a [facility] PCP currently.  Pt requesting [place] for this pain as that is what UC told her she needs.  Her current PCP has referred her to the OB/GYN which will take approx 6 weeks.  Pt was seen in UC and was given a CT as well as a urine test.  At this time pt requesting [place], advised to call her current PCP office to sched the [place]. Pt states she has seen her current PCP and they would not order the [place].  Advised to see other provider within the clinic as she does not want to switch to [facility] PCP until after Jan 1. Pt was advised to go to ED should the pain increase, pt advised to call us back should she have more questions.   Copied from CRM [id number]. Topic: Clinical - Red Word Triage >> Nov 18, 2023 10:46 AM Pascal Lux wrote: Red Word that prompted transfer to Nurse Triage: Lower right abdominal pain, really bad cramps moves to left and then back. Did a CT scan at urgent care and did not find anything - wants to have an ultrasound. Whenever she does #1 feels like it's pushing against something and when she does #2 it hurts even more. Pain is worse when she is laying down. - Scheduled an appointment, offered to speak with nurse - warm transfer. Reason for Disposition  [1] MILD-MODERATE pain AND [2] constant AND [3] present > 2 hours  Answer Assessment - Initial Assessment Questions 1. LOCATION: "Where does it hurt?"      RLQ 2. RADIATION: "Does the pain shoot anywhere else?" (e.g., chest, back)     Moves to L side,  pressure-like real bad cramps 3. ONSET: "When did the pain begin?" (e.g., minutes, hours or days ago)      Over 6 months, feels as though it is getting worse. 4. SUDDEN: "Gradual or sudden onset?"     Sudden 6 months ago 5. PATTERN "Does the pain come and go, or is it constant?"    - If it comes and goes: "How long does it last?" "Do you have pain now?"     (Note: Comes and goes means the pain is intermittent. It goes away completely between bouts.)    - If constant: "Is it getting better, staying the same, or getting worse?"      (Note: Constant means the pain never goes away completely; most serious pain is constant and gets worse.)      constant 6. SEVERITY: "How bad is the pain?"  (e.g., Scale 1-10; mild, moderate, or severe)    - MILD (1-3): Doesn't interfere with normal activities, abdomen soft and not tender to touch.     - MODERATE (4-7): Interferes with normal activities or awakens from sleep, abdomen tender to touch.     - SEVERE (8-10): Excruciating pain, doubled over, unable to do any normal activities.       moderate 7. RECURRENT SYMPTOM: "Have you ever had this type of stomach pain before?" If Yes, ask: "When was the last time?" and "What happened that time?"  denies 8. CAUSE: "What do you think is causing the stomach pain?"     Not sure, questions if it is a hernia or something else growing. They told me it was my bowels, but that's not it 9. RELIEVING/AGGRAVATING FACTORS: "What makes it better or worse?" (e.g., antacids, bending or twisting motion, bowel movement)     Laying flat and side laying makes it worse. Felt like sauna may have helped initially 10. OTHER SYMPTOMS: "Do you have any other symptoms?" (e.g., back pain, diarrhea, fever, urination pain, vomiting)       Sweats, denies bowel changes, urinary symptoms include pressure when bladder is full.  Protocols used: Abdominal Pain - Female-A-AH

## 2023-11-18 NOTE — Telephone Encounter (Signed)
Referral  was sent to GYN on 11/15/2023

## 2023-11-22 ENCOUNTER — Ambulatory Visit: Payer: Self-pay | Admitting: *Deleted

## 2023-11-22 ENCOUNTER — Telehealth: Payer: Self-pay | Admitting: Family Medicine

## 2023-11-22 NOTE — Telephone Encounter (Signed)
  Chief Complaint: Lower abd pain is getting worse Symptoms: Had this for several months.   Several tests have been done.    Has a specialist appt coming up in Jan. 2025. Frequency: on and off for last 6 months Pertinent Negatives: Patient denies diarrhea, vomiting Disposition: [] ED /[] Urgent Care (no appt availability in office) / [x] Appointment(In office/virtual)/ []  [facility]/ [] Home Care/ [] Refused Recommended Disposition /[]  Mobile Bus/ []  Follow-up with PCP Additional Notes: Appt made with Ricky Stabs, NP for 12/08/2023 at 2:40.

## 2023-11-22 NOTE — Telephone Encounter (Signed)
Reason for Disposition  [1] MODERATE pain (e.g., interferes with normal activities) AND [2] pain comes and goes (cramps) AND [3] present > 24 hours  (Exception: Pain with Vomiting or Diarrhea - see that Guideline.)  Answer Assessment - Initial Assessment Questions 1. LOCATION: "Where does it hurt?"      I'm having abd pain that s getting worse.     Lower abd left right and back.    2. RADIATION: "Does the pain shoot anywhere else?" (e.g., chest, back)     No 3. ONSET: "When did the pain begin?" (e.g., minutes, hours or days ago)      I've been having it.    It's getting worse. 4. SUDDEN: "Gradual or sudden onset?"     Not asked 5. PATTERN "Does the pain come and go, or is it constant?"    - If it comes and goes: "How long does it last?" "Do you have pain now?"     (Note: Comes and goes means the pain is intermittent. It goes away completely between bouts.)    - If constant: "Is it getting better, staying the same, or getting worse?"      (Note: Constant means the pain never goes away completely; most serious pain is constant and gets worse.)      Constant.    I don't know what is wrong.    I'm having bad cramps. 6. SEVERITY: "How bad is the pain?"  (e.g., Scale 1-10; mild, moderate, or severe)    - MILD (1-3): Doesn't interfere with normal activities, abdomen soft and not tender to touch.     - MODERATE (4-7): Interferes with normal activities or awakens from sleep, abdomen tender to touch.     - SEVERE (8-10): Excruciating pain, doubled over, unable to do any normal activities.       Moderate 7. RECURRENT SYMPTOM: "Have you ever had this type of stomach pain before?" If Yes, ask: "When was the last time?" and "What happened that time?"      Yes 8. CAUSE: "What do you think is causing the stomach pain?"     I don't know 9. RELIEVING/AGGRAVATING FACTORS: "What makes it better or worse?" (e.g., antacids, bending or twisting motion, bowel movement)     Nothing 10. OTHER SYMPTOMS: "Do you  have any other symptoms?" (e.g., back pain, diarrhea, fever, urination pain, vomiting)       No diarrhea or vomiting.   Sweating at night 11. PREGNANCY: "Is there any chance you are pregnant?" "When was your last menstrual period?"       N/A due to age  Protocols used: Abdominal Pain - [facility]

## 2023-12-02 ENCOUNTER — Encounter: Payer: Self-pay | Admitting: Emergency Medicine

## 2023-12-02 ENCOUNTER — Ambulatory Visit (INDEPENDENT_AMBULATORY_CARE_PROVIDER_SITE_OTHER): Payer: 59 | Admitting: Emergency Medicine

## 2023-12-02 VITALS — BP 128/78 | HR 88 | Temp 98.6°F | Ht 66.0 in | Wt 239.0 lb

## 2023-12-02 DIAGNOSIS — R109 Unspecified abdominal pain: Secondary | ICD-10-CM | POA: Diagnosis not present

## 2023-12-02 DIAGNOSIS — G8929 Other chronic pain: Secondary | ICD-10-CM | POA: Insufficient documentation

## 2023-12-02 MED ORDER — HYOSCYAMINE SULFATE 0.125 MG SL SUBL
0.2500 mg | SUBLINGUAL_TABLET | Freq: Four times a day (QID) | SUBLINGUAL | 1 refills | Status: DC | PRN
Start: 2023-12-02 — End: 2024-02-09

## 2023-12-02 NOTE — Patient Instructions (Signed)
 Abdominal Pain, Adult    Many things can cause belly (abdominal) pain. In most cases, belly pain is not a serious problem and can be watched and treated at home. But in some cases, it can be serious.  Your doctor will try to find the cause of your belly pain.  Follow these instructions at home:  Medicines  Take over-the-counter and prescription medicines only as told by your doctor.  Do not take medicines that help you poop (laxatives) unless told by your doctor.  General instructions  Watch your belly pain for any changes. Tell your doctor if the pain gets worse.  Drink enough fluid to keep your pee (urine) pale yellow.  Contact a doctor if:  Your belly pain changes or gets worse.  You have very bad cramping or bloating in your belly.  You vomit.  Your pain gets worse with meals, after eating, or with certain foods.  You have trouble pooping or have watery poop for more than 2-3 days.  You are not hungry, or you lose weight without trying.  You have signs of not getting enough fluid or water (dehydration). These may include:  Dark pee, very Chastain pee, or no pee.  Cracked lips or dry mouth.  Feeling sleepy or weak.  You have pain when you pee or poop.  Your belly pain wakes you up at night.  You have blood in your pee.  You have a fever.  Get help right away if:  You cannot stop vomiting.  Your pain is only in one part of your belly, like on the right side.  You have bloody or black poop, or poop that looks like tar.  You have trouble breathing.  You have chest pain.  These symptoms may be an emergency. Get help right away. Call 911.  Do not wait to see if the symptoms will go away.  Do not drive yourself to the hospital.  This information is not intended to replace advice given to you by your health care provider. Make sure you discuss any questions you have with your health care provider.  Document Revised: 08/26/2022 Document Reviewed: 08/26/2022  Elsevier Patient Education  2024 [employer].

## 2023-12-02 NOTE — Progress Notes (Signed)
 Molly Hanson 68 y.o.   Chief Complaint  Patient presents with   Abdominal Pain    Lower right abdominal pain she states she's really bad cramps moves to left and then back. Did a CT and did not find anything. wants to have an ultrasound. Whenever she does #1 feels like it's pushing against something and when she does #2 it hurts even more.has been happening for 6 months hurts the most when she is laying down has trouble sleeping     HISTORY OF PRESENT ILLNESS: Acute problem visit today. This is a 68 y.o. female complaining of intermittent right lower abdominal pain for the past 6 months. Cramping pain prior to need to have a bowel movement.  Occasional constipation Negative workup so far including CT scan of abdomen and pelvis done on 08/31/2023 No other complaints or medical concerns today.  Abdominal Pain Associated symptoms include constipation. Pertinent negatives include no dysuria, fever, headaches, hematuria, melena, nausea or vomiting.     Prior to Admission medications   Medication Sig Start Date End Date Taking? Authorizing Provider  hyoscyamine  (LEVSIN /SL) 0.125 MG SL tablet Place 2 tablets (0.25 mg total) under the tongue every 6 (six) hours as needed. 12/02/23  Yes Purcell Emil Schanz, MD    No Known Allergies  Patient Active Problem List   Diagnosis Date Noted   Hx of adenomatous colonic polyps 04/30/2022    Past Medical History:  Diagnosis Date   Arthritis    knees, arm    Hyperlipidemia    has been elevated in the past - no meds    Vitamin D  deficiency     Past Surgical History:  Procedure Laterality Date   ABDOMINAL HYSTERECTOMY     COLONOSCOPY     > 10 yrs ago- normal     Social History   Socioeconomic History   Marital status: Single    Spouse name: Not on file   Number of children: Not on file   Years of education: Not on file   Highest education level: Associate degree: occupational, scientist, product/process development, or vocational program  Occupational  History   Not on file  Tobacco Use   Smoking status: Former    Current packs/day: 0.00    Types: Cigarettes    Quit date: 07/15/2020    Years since quitting: 3.3    Passive exposure: Past   Smokeless tobacco: Never  Vaping Use   Vaping status: Never Used  Substance and Sexual Activity   Alcohol use: Yes    Comment: Occassionally.   Drug use: Never   Sexual activity: Not Currently  Other Topics Concern   Not on file  Social History Narrative   Not on file   Social Drivers of Health   Financial Resource Strain: Patient Declined (06/14/2023)   Overall Financial Resource Strain (CARDIA)    Difficulty of Paying Living Expenses: Patient declined  Food Insecurity: Low Risk  (09/09/2023)   Received from [facility]   Hunger Vital Sign    Worried About Running Out of Food in the Last Year: Never true    Ran Out of Food in the Last Year: Never true  Transportation Needs: No Transportation Needs (09/09/2023)   Received from [facility]    In the past 12 months, has lack of reliable transportation kept you from medical appointments, meetings, work or from getting things needed for daily living? : No  Physical Activity: Insufficiently Active (06/14/2023)   Exercise Vital Sign  Days of Exercise per Week: 3 days    Minutes of Exercise per Session: 30 min  Stress: No Stress Concern Present (06/14/2023)   [name] - Occupational Stress Questionnaire    Feeling of Stress : Not at all  Social Connections: Moderately Isolated (06/14/2023)   Social Connection and Isolation Panel [NHANES]    Frequency of Communication with Friends and Family: More than three times a week    Frequency of Social Gatherings with Friends and Family: Twice a week    Attends Religious Services: 1 to 4 times per year    Active Member of [employer] or Organizations: No    Attends Engineer, Structural: Not on file    Marital Status: Divorced  Catering Manager  Violence: Not on file    Family History  Problem Relation Age of Onset   Breast cancer Mother    Diabetes Mother    Diabetes Father    Diabetes Maternal Grandmother    Stroke Maternal Grandmother    Colon polyps Maternal Grandmother    Colon cancer Maternal Grandfather    Diabetes Paternal Grandmother    Colon polyps Maternal Uncle    Esophageal cancer Neg Hx    Rectal cancer Neg Hx    Stomach cancer Neg Hx      Review of Systems  Constitutional: Negative.  Negative for chills and fever.  HENT: Negative.  Negative for congestion and sore throat.   Respiratory: Negative.  Negative for cough and shortness of breath.   Cardiovascular: Negative.  Negative for chest pain and palpitations.  Gastrointestinal:  Positive for abdominal pain and constipation. Negative for blood in stool, melena, nausea and vomiting.  Genitourinary: Negative.  Negative for dysuria and hematuria.  Musculoskeletal: Negative.   Skin: Negative.  Negative for rash.  Neurological: Negative.  Negative for dizziness and headaches.  All other systems reviewed and are negative.   Vitals:   12/02/23 1520  BP: 128/78  Pulse: 88  Temp: 98.6 F (37 C)  SpO2: 95%    Physical Exam Vitals reviewed.  Constitutional:      Appearance: She is well-developed. She is obese.  HENT:     Head: Normocephalic.     Mouth/Throat:     Mouth: Mucous membranes are moist.     Pharynx: Oropharynx is clear.  Eyes:     Extraocular Movements: Extraocular movements intact.  Cardiovascular:     Rate and Rhythm: Normal rate and regular rhythm.     Pulses: Normal pulses.     Heart sounds: Normal heart sounds.  Pulmonary:     Effort: Pulmonary effort is normal.     Breath sounds: Normal breath sounds.  Abdominal:     Palpations: Abdomen is soft.     Tenderness: There is no abdominal tenderness. There is no right CVA tenderness, left CVA tenderness, guarding or rebound.  Skin:    General: Skin is warm and dry.     Capillary  Refill: Capillary refill takes less than 2 seconds.  Neurological:     General: No focal deficit present.     Mental Status: She is alert and oriented to person, place, and time.  Psychiatric:        Mood and Affect: Mood normal.        Behavior: Behavior normal.      ASSESSMENT & PLAN: A total of 42 minutes was spent with the patient and counseling/coordination of care regarding preparing for this visit, review of available medical  records, review of most recent office visit notes, review of most recent imaging reports, review of most recent blood work results, differential diagnosis of chronic abdominal pain and its management, review of all medications and changes made, education on nutrition and treatment of constipation, prognosis, documentation and need for follow-up with PCP  Problem List Items Addressed This Visit       Other   Chronic abdominal pain - Primary   Chronic and intermittent abdominal cramping Clinically stable.  No red flag signs or symptoms Benign abdominal examination Unremarkable recent CT scan of abdomen and pelvis Unremarkable recent workup.  Has been evaluated by GI doctor in the past with no significant findings Diverticulosis but no diverticulitis on CT scan Suspect functional pain with abdominal spasm Recommend Levsin  sublingual as needed Constipation also playing a role. Advised to increase amount of daily fiber intake Recommend to take daily fiber supplement Needs to follow-up with PCP for further evaluation and workup      Relevant Medications   hyoscyamine  (LEVSIN /SL) 0.125 MG SL tablet   Patient Instructions  Abdominal Pain, Adult  Many things can cause belly (abdominal) pain. In most cases, belly pain is not a serious problem and can be watched and treated at home. But in some cases, it can be serious. Your doctor will try to find the cause of your belly pain. Follow these instructions at home: Medicines Take over-the-counter and  prescription medicines only as told by your doctor. Do not take medicines that help you poop (laxatives) unless told by your doctor. General instructions Watch your belly pain for any changes. Tell your doctor if the pain gets worse. Drink enough fluid to keep your pee (urine) pale yellow. Contact a doctor if: Your belly pain changes or gets worse. You have very bad cramping or bloating in your belly. You vomit. Your pain gets worse with meals, after eating, or with certain foods. You have trouble pooping or have watery poop for more than 2-3 days. You are not hungry, or you lose weight without trying. You have signs of not getting enough fluid or water (dehydration). These may include: Dark pee, very little pee, or no pee. Cracked lips or dry mouth. Feeling sleepy or weak. You have pain when you pee or poop. Your belly pain wakes you up at night. You have blood in your pee. You have a fever. Get help right away if: You cannot stop vomiting. Your pain is only in one part of your belly, like on the right side. You have bloody or black poop, or poop that looks like tar. You have trouble breathing. You have chest pain. These symptoms may be an emergency. Get help right away. Call 911. Do not wait to see if the symptoms will go away. Do not drive yourself to the hospital. This information is not intended to replace advice given to you by your health care provider. Make sure you discuss any questions you have with your health care provider. Document Revised: 08/26/2022 Document Reviewed: 08/26/2022 Elsevier Patient Education  2024 Elsevier Inc.      Emil Schaumann, MD [facility]

## 2023-12-02 NOTE — Assessment & Plan Note (Signed)
 Chronic and intermittent abdominal cramping Clinically stable.  No red flag signs or symptoms Benign abdominal examination Unremarkable recent CT scan of abdomen and pelvis Unremarkable recent workup.  Has been evaluated by GI doctor in the past with no significant findings Diverticulosis but no diverticulitis on CT scan Suspect functional pain with abdominal spasm Recommend Levsin  sublingual as needed Constipation also playing a role. Advised to increase amount of daily fiber intake Recommend to take daily fiber supplement Needs to follow-up with PCP for further evaluation and workup

## 2023-12-03 ENCOUNTER — Telehealth: Payer: Self-pay | Admitting: Family Medicine

## 2023-12-03 NOTE — Telephone Encounter (Signed)
 Copied from CRM [id number]. Topic: Clinical - Medication Question >> Dec 03, 2023 12:03 PM Almira Coaster wrote: Reason for CRM: Misty Stanley from [employer]  is calling to advise that hyoscyamine (LEVSIN/SL) 0.125 MG SL tablet requires a pre-authorization.

## 2023-12-06 ENCOUNTER — Telehealth: Payer: Self-pay | Admitting: Family Medicine

## 2023-12-06 NOTE — Telephone Encounter (Signed)
 Copied from CRM [id number]. Topic: General - Inquiry >> Dec 03, 2023 12:30 PM Tobias CROME wrote: Reason for CRM: Jaycel with [facility] insurance calling stating that Dr. Tanda is not listed as one of their providers accepting [facility] and they are needing for Dr. Tanda to add herself to the list of providers accepting [facility] insurance.   Jaycel cannot list Dr. Tanda as patient's PCP until this is fixed.   Dr. Tanda can be added by calling, [phone]

## 2023-12-07 ENCOUNTER — Ambulatory Visit (INDEPENDENT_AMBULATORY_CARE_PROVIDER_SITE_OTHER): Payer: 59 | Admitting: Family

## 2023-12-07 VITALS — BP 131/76 | HR 78 | Temp 98.5°F | Ht 66.0 in | Wt 236.2 lb

## 2023-12-07 DIAGNOSIS — R059 Cough, unspecified: Secondary | ICD-10-CM | POA: Diagnosis not present

## 2023-12-07 MED ORDER — PREDNISONE 10 MG PO TABS
ORAL_TABLET | ORAL | 0 refills | Status: AC
Start: 2023-12-07 — End: 2023-12-13

## 2023-12-07 NOTE — Telephone Encounter (Signed)
 Called [employer] with Laurina Bustle on the conference called. You have been added as her PCP effective 12/07/23 . Ref # [id number]

## 2023-12-07 NOTE — Progress Notes (Signed)
 Patient ID: Molly Hanson, female    DOB: Jul 04, 1956  MRN: [id number]  CC: Cough   Subjective: Molly Hanson is a 68 y.o. female who presents for cough.   Her concerns today include:  - Cough for several days. Denies red flag symptoms. States cough feels like when she has bronchitis flare. Taking over-the-counter medications to help.   Patient Active Problem List   Diagnosis Date Noted   Chronic abdominal pain 12/02/2023   Hx of adenomatous colonic polyps 04/30/2022     Current Outpatient Medications on File Prior to Visit  Medication Sig Dispense Refill   hyoscyamine  (LEVSIN /SL) 0.125 MG SL tablet Place 2 tablets (0.25 mg total) under the tongue every 6 (six) hours as needed. (Patient not taking: Reported on 12/07/2023) 20 tablet 1   No current facility-administered medications on file prior to visit.    No Known Allergies  Social History   Socioeconomic History   Marital status: Single    Spouse name: Not on file   Number of children: Not on file   Years of education: Not on file   Highest education level: Associate degree: occupational, scientist, product/process development, or vocational program  Occupational History   Not on file  Tobacco Use   Smoking status: Former    Current packs/day: 0.00    Types: Cigarettes    Quit date: 07/15/2020    Years since quitting: 3.4    Passive exposure: Past   Smokeless tobacco: Never  Vaping Use   Vaping status: Never Used  Substance and Sexual Activity   Alcohol use: Yes    Comment: Occassionally.   Drug use: Never   Sexual activity: Not Currently  Other Topics Concern   Not on file  Social History Narrative   Not on file   Social Drivers of Health   Financial Resource Strain: Patient Declined (06/14/2023)   Overall Financial Resource Strain (CARDIA)    Difficulty of Paying Living Expenses: Patient declined  Food Insecurity: Low Risk  (09/09/2023)   Received from [facility]   Hunger Vital Sign    Worried About Running Out of Food in  the Last Year: Never true    Ran Out of Food in the Last Year: Never true  Transportation Needs: No Transportation Needs (09/09/2023)   Received from [facility]    In the past 12 months, has lack of reliable transportation kept you from medical appointments, meetings, work or from getting things needed for daily living? : No  Physical Activity: Insufficiently Active (06/14/2023)   Exercise Vital Sign    Days of Exercise per Week: 3 days    Minutes of Exercise per Session: 30 min  Stress: No Stress Concern Present (06/14/2023)   [name] - Occupational Stress Questionnaire    Feeling of Stress : Not at all  Social Connections: Moderately Isolated (06/14/2023)   Social Connection and Isolation Panel [NHANES]    Frequency of Communication with Friends and Family: More than three times a week    Frequency of Social Gatherings with Friends and Family: Twice a week    Attends Religious Services: 1 to 4 times per year    Active Member of [employer] or Organizations: No    Attends Engineer, Structural: Not on file    Marital Status: Divorced  Intimate Partner Violence: Not on file    Family History  Problem Relation Age of Onset   Breast cancer Mother    Diabetes Mother  Diabetes Father    Diabetes Maternal Grandmother    Stroke Maternal Grandmother    Colon polyps Maternal Grandmother    Colon cancer Maternal Grandfather    Diabetes Paternal Grandmother    Colon polyps Maternal Uncle    Esophageal cancer Neg Hx    Rectal cancer Neg Hx    Stomach cancer Neg Hx     Past Surgical History:  Procedure Laterality Date   ABDOMINAL HYSTERECTOMY     COLONOSCOPY     > 10 yrs ago- normal     ROS: Review of Systems Negative except as stated above  PHYSICAL EXAM: BP 131/76   Pulse 78   Temp 98.5 F (36.9 C) (Oral)   Ht 5' 6 (1.676 m)   Wt 236 lb 3.2 oz (107.1 kg)   SpO2 91%   BMI 38.12 kg/m   Physical Exam HENT:      Head: Normocephalic and atraumatic.     Nose: Nose normal.     Mouth/Throat:     Mouth: Mucous membranes are moist.     Pharynx: Oropharynx is clear.  Eyes:     Extraocular Movements: Extraocular movements intact.     Conjunctiva/sclera: Conjunctivae normal.     Pupils: Pupils are equal, round, and reactive to light.  Cardiovascular:     Rate and Rhythm: Normal rate and regular rhythm.     Pulses: Normal pulses.     Heart sounds: Normal heart sounds.  Pulmonary:     Effort: Pulmonary effort is normal.     Breath sounds: Normal breath sounds.  Musculoskeletal:        General: Normal range of motion.     Cervical back: Normal range of motion and neck supple.  Neurological:     General: No focal deficit present.     Mental Status: She is alert and oriented to person, place, and time.  Psychiatric:        Mood and Affect: Mood normal.        Behavior: Behavior normal.    ASSESSMENT AND PLAN: 1. Cough, unspecified type (Primary) - Patient today in office with no cardiopulmonary/acute distress.  - Prednisone  as prescribed. Counseled on medication adherence/adverse effects.  - Patient declined respiratory panel and chest xray. - Follow-up with primary provider as scheduled.  - predniSONE  (DELTASONE ) 10 MG tablet; Take 6 tablets (60 mg total) by mouth daily with breakfast for 1 day, THEN 5 tablets (50 mg total) daily with breakfast for 1 day, THEN 4 tablets (40 mg total) daily with breakfast for 1 day, THEN 3 tablets (30 mg total) daily with breakfast for 1 day, THEN 2 tablets (20 mg total) daily with breakfast for 1 day, THEN 1 tablet (10 mg total) daily with breakfast for 1 day.  Dispense: 21 tablet; Refill: 0    Patient was given the opportunity to ask questions.  Patient verbalized understanding of the plan and was able to repeat key elements of the plan. Patient was given clear instructions to go to Emergency Department or return to medical center if symptoms don't improve, worsen,  or new problems develop.The patient verbalized understanding.    Requested Prescriptions   Signed Prescriptions Disp Refills   predniSONE  (DELTASONE ) 10 MG tablet 21 tablet 0    Sig: Take 6 tablets (60 mg total) by mouth daily with breakfast for 1 day, THEN 5 tablets (50 mg total) daily with breakfast for 1 day, THEN 4 tablets (40 mg total) daily with breakfast for 1 day,  THEN 3 tablets (30 mg total) daily with breakfast for 1 day, THEN 2 tablets (20 mg total) daily with breakfast for 1 day, THEN 1 tablet (10 mg total) daily with breakfast for 1 day.    Return for Follow-Up or next available with Raguel Blush, MD .  Greig JINNY Drones, NP

## 2023-12-07 NOTE — Progress Notes (Signed)
 Patient states she has bronchitis.

## 2023-12-08 ENCOUNTER — Ambulatory Visit: Payer: Medicare HMO | Admitting: Family

## 2023-12-10 ENCOUNTER — Telehealth: Payer: Self-pay

## 2023-12-10 ENCOUNTER — Other Ambulatory Visit (HOSPITAL_COMMUNITY): Payer: Self-pay

## 2023-12-10 NOTE — Telephone Encounter (Signed)
Pharmacy Patient Advocate Encounter   Received notification from Pt Calls Messages that prior authorization for Hyoscyamine 0.125mg  sl tabs is required/requested.   Insurance verification completed.   The patient is insured through [facility] .   Per test claim: PA required; PA submitted to above mentioned insurance via CoverMyMeds Key/confirmation #/EOC [id number] Status is pending

## 2023-12-14 NOTE — Telephone Encounter (Signed)
Pharmacy Patient Advocate Encounter  Received notification from [facility] that Prior Authorization for Hyoscyamine 0.125mg  sl tabs has been DENIED.  See denial reason below. No denial letter attached in CMM. Will attach denial letter to Media tab once received.    PA #/Case ID/Reference #: [id number]   Placed a call to [facility] pharmacy and the pharmacist filled the order under GoodRx and the copay is $17.65.

## 2024-01-24 DIAGNOSIS — M9901 Segmental and somatic dysfunction of cervical region: Secondary | ICD-10-CM | POA: Diagnosis not present

## 2024-01-24 DIAGNOSIS — M5033 Other cervical disc degeneration, cervicothoracic region: Secondary | ICD-10-CM | POA: Diagnosis not present

## 2024-01-26 DIAGNOSIS — M5033 Other cervical disc degeneration, cervicothoracic region: Secondary | ICD-10-CM | POA: Diagnosis not present

## 2024-01-26 DIAGNOSIS — M9901 Segmental and somatic dysfunction of cervical region: Secondary | ICD-10-CM | POA: Diagnosis not present

## 2024-01-31 DIAGNOSIS — M9901 Segmental and somatic dysfunction of cervical region: Secondary | ICD-10-CM | POA: Diagnosis not present

## 2024-01-31 DIAGNOSIS — M5033 Other cervical disc degeneration, cervicothoracic region: Secondary | ICD-10-CM | POA: Diagnosis not present

## 2024-02-02 DIAGNOSIS — M5033 Other cervical disc degeneration, cervicothoracic region: Secondary | ICD-10-CM | POA: Diagnosis not present

## 2024-02-02 DIAGNOSIS — M9901 Segmental and somatic dysfunction of cervical region: Secondary | ICD-10-CM | POA: Diagnosis not present

## 2024-02-07 DIAGNOSIS — M9901 Segmental and somatic dysfunction of cervical region: Secondary | ICD-10-CM | POA: Diagnosis not present

## 2024-02-07 DIAGNOSIS — M5033 Other cervical disc degeneration, cervicothoracic region: Secondary | ICD-10-CM | POA: Diagnosis not present

## 2024-02-09 ENCOUNTER — Encounter (HOSPITAL_BASED_OUTPATIENT_CLINIC_OR_DEPARTMENT_OTHER): Payer: Self-pay | Admitting: Family Medicine

## 2024-02-09 ENCOUNTER — Ambulatory Visit (HOSPITAL_BASED_OUTPATIENT_CLINIC_OR_DEPARTMENT_OTHER)

## 2024-02-09 ENCOUNTER — Ambulatory Visit (INDEPENDENT_AMBULATORY_CARE_PROVIDER_SITE_OTHER): Admitting: Family Medicine

## 2024-02-09 VITALS — BP 127/72 | HR 91 | Temp 99.3°F | Ht 66.0 in | Wt 236.0 lb

## 2024-02-09 DIAGNOSIS — R0602 Shortness of breath: Secondary | ICD-10-CM

## 2024-02-09 DIAGNOSIS — R051 Acute cough: Secondary | ICD-10-CM | POA: Diagnosis not present

## 2024-02-09 DIAGNOSIS — R509 Fever, unspecified: Secondary | ICD-10-CM | POA: Diagnosis not present

## 2024-02-09 MED ORDER — ALBUTEROL SULFATE HFA 108 (90 BASE) MCG/ACT IN AERS
2.0000 | INHALATION_SPRAY | Freq: Four times a day (QID) | RESPIRATORY_TRACT | 2 refills | Status: AC | PRN
Start: 2024-02-09 — End: ?

## 2024-02-09 MED ORDER — PROMETHAZINE-DM 6.25-15 MG/5ML PO SYRP
5.0000 mL | ORAL_SOLUTION | Freq: Four times a day (QID) | ORAL | 0 refills | Status: DC | PRN
Start: 2024-02-09 — End: 2024-02-29

## 2024-02-09 MED ORDER — PREDNISONE 10 MG (21) PO TBPK
ORAL_TABLET | ORAL | 0 refills | Status: DC
Start: 2024-02-09 — End: 2024-02-29

## 2024-02-09 MED ORDER — AZITHROMYCIN 250 MG PO TABS
ORAL_TABLET | ORAL | 0 refills | Status: AC
Start: 2024-02-09 — End: 2024-02-14

## 2024-02-09 NOTE — Patient Instructions (Signed)
 Please go to the 2nd Floor (orthopedics) for your chest xray.    Pick up plain mucinex (not DM) for congestion. Increase your clear fluids.

## 2024-02-09 NOTE — Progress Notes (Signed)
 Molly Hanson,  Your chest xray is negative for pneumonia. Please proceed with the prescribed medications for treatment of bronchitis.

## 2024-02-09 NOTE — Progress Notes (Signed)
 Acute Care Office Visit  Subjective:   Molly Hanson 31-Aug-1956 02/09/2024  Chief Complaint  Patient presents with   Cough    Pt states she had been out in the rain 3 days ago and states 2 days ago she began to cough. States she is coughing hard enough that her ribs hurt and she wonders if she might have broken a rib. States she does have green nasal drainage and will occasionally cough up phlegm. Denies any fever that she knows of.    HPI: URI SYMPTOMS:  Patient reports sinus pressure and congestion starting approx. 4 days ago. She reports a congested, non productive cough with "left sided rib pain" that is continuing to worsen over the past 4 days. She reports mild shortness of breath with exertion. Reports taking Theraflu without relief.  She states that she has noticed some expiratory wheezing while laying down at nighttime.  She does not use any inhaler or rescue inhaler for symptoms.  Reports fever, chills, and mildly decreased appetite.  She denies chest pain, nausea, vomiting, or diarrhea.  Denies no known sick contacts recently.  Patient is a former smoker.  Reports hx of bronchitis and recent bronchitis flare in January 2025 treated with prednisone. Patient reports current yearly infection of bronchitis.    The following portions of the patient's history were reviewed and updated as appropriate: past medical history, past surgical history, family history, social history, allergies, medications, and problem list.   Patient Active Problem List   Diagnosis Date Noted   Chronic abdominal pain 12/02/2023   Abnormal auditory perception of both ears 09/09/2023   Imbalance 09/09/2023   Hx of adenomatous colonic polyps 04/30/2022   Past Medical History:  Diagnosis Date   Arthritis    knees, arm    Hyperlipidemia    has been elevated in the past - no meds    Vitamin D deficiency    Past Surgical History:  Procedure Laterality Date   ABDOMINAL HYSTERECTOMY      COLONOSCOPY     > 10 yrs ago- normal    Family History  Problem Relation Age of Onset   Breast cancer Mother    Diabetes Mother    Diabetes Father    Diabetes Maternal Grandmother    Stroke Maternal Grandmother    Colon polyps Maternal Grandmother    Colon cancer Maternal Grandfather    Diabetes Paternal Grandmother    Colon polyps Maternal Uncle    Esophageal cancer Neg Hx    Rectal cancer Neg Hx    Stomach cancer Neg Hx    Outpatient Medications Prior to Visit  Medication Sig Dispense Refill   hyoscyamine (LEVSIN/SL) 0.125 MG SL tablet Place 2 tablets (0.25 mg total) under the tongue every 6 (six) hours as needed. (Patient not taking: Reported on 12/07/2023) 20 tablet 1   No facility-administered medications prior to visit.   No Known Allergies   ROS: A complete ROS was performed with pertinent positives/negatives noted in the HPI. The remainder of the ROS are negative.    Objective:   Today's Vitals   02/09/24 1025  BP: 127/72  Pulse: 91  Temp: 99.3 F (37.4 C)  TempSrc: Oral  SpO2: 99%  Weight: 236 lb (107 kg)  Height: 5\' 6"  (1.676 m)    GENERAL: Well-appearing, in NAD. Well nourished.  SKIN: Pink, warm and dry.  Head: Normocephalic. NECK: Trachea midline. Full ROM w/o pain or tenderness. No lymphadenopathy.  EARS: Tympanic membranes  are intact, translucent without bulging and without drainage. Appropriate landmarks visualized.  EYES: Conjunctiva clear without exudates. EOMI, PERRL, no drainage present.  NOSE: Septum midline w/o deformity. Nares patent, mucosa pink and non-inflamed w/o drainage. No sinus tenderness.  THROAT: Uvula midline. Oropharynx clear. Mucous membranes pink and moist.  RESPIRATORY: Chest wall symmetrical. Respirations even and non-labored, mild expiratory wheeze to right lung bases.  No rhonchi, crackles, or rales upon auscultation.No crepitus or deformity present on exam to intercostal area. Cough is congested, non productive.  CARDIAC:  S1, S2 present, regular rate and rhythm without murmur or gallops. Peripheral pulses 2+ bilaterally.  MSK: Muscle tone and strength appropriate for age.  NEUROLOGIC: No motor or sensory deficits. Steady, even gait. C2-C12 intact.  PSYCH/MENTAL STATUS: Alert, oriented x 3. Cooperative, appropriate mood and affect.    Assessment & Plan:  1. Shortness of breath (Primary) 2. Acute cough Possible bronchitis due to COPD given patient's tobacco history.  Will rule out possible infection or pneumonia with chest x-ray today.  No concern for rib injury or fracture upon exam at this time.  Recommend patient to start prednisone taper, Z-Pak, albuterol inhaler as needed, plain Mucinex for congestion as needed, and Promethazine DM for cough as needed.  Safe use of medications reviewed with patient.  Will notify patient of x-ray results when available.  Recommend she follow-up with PCP in 48 hours if symptoms are worsening or no improvement.  - DG Chest 2 View; Future - predniSONE (STERAPRED UNI-PAK 21 TAB) 10 MG (21) TBPK tablet; Use as directed.  Dispense: 21 each; Refill: 0 - azithromycin (ZITHROMAX) 250 MG tablet; Take 2 tablets on day 1, then 1 tablet daily on days 2 through 5  Dispense: 6 tablet; Refill: 0 - albuterol (VENTOLIN HFA) 108 (90 Base) MCG/ACT inhaler; Inhale 2 puffs into the lungs every 6 (six) hours as needed for wheezing or shortness of breath.  Dispense: 8 g; Refill: 2 - promethazine-dextromethorphan (PROMETHAZINE-DM) 6.25-15 MG/5ML syrup; Take 5 mLs by mouth 4 (four) times daily as needed.  Dispense: 118 mL; Refill: 0   Meds ordered this encounter  Medications   predniSONE (STERAPRED UNI-PAK 21 TAB) 10 MG (21) TBPK tablet    Sig: Use as directed.    Dispense:  21 each    Refill:  0    Supervising Provider:   DE [place], RAYMOND J [[id number]]   azithromycin (ZITHROMAX) 250 MG tablet    Sig: Take 2 tablets on day 1, then 1 tablet daily on days 2 through 5    Dispense:  6 tablet    Refill:   0    Supervising Provider:   DE [place], RAYMOND J [[id number]]   albuterol (VENTOLIN HFA) 108 (90 Base) MCG/ACT inhaler    Sig: Inhale 2 puffs into the lungs every 6 (six) hours as needed for wheezing or shortness of breath.    Dispense:  8 g    Refill:  2    Supervising Provider:   DE [place], RAYMOND J [[id number]]   promethazine-dextromethorphan (PROMETHAZINE-DM) 6.25-15 MG/5ML syrup    Sig: Take 5 mLs by mouth 4 (four) times daily as needed.    Dispense:  118 mL    Refill:  0    Supervising Provider:   DE [place], RAYMOND J [[id number]]   Return if symptoms worsen or fail to improve.    Patient to reach out to office if new, worrisome, or unresolved symptoms arise or if no improvement in patient's condition. Patient verbalized understanding  and is agreeable to treatment plan. All questions answered to patient's satisfaction.    Hilbert Bible, [place]

## 2024-02-28 ENCOUNTER — Ambulatory Visit: Admitting: Medical

## 2024-02-28 DIAGNOSIS — H25013 Cortical age-related cataract, bilateral: Secondary | ICD-10-CM | POA: Diagnosis not present

## 2024-02-28 DIAGNOSIS — H2511 Age-related nuclear cataract, right eye: Secondary | ICD-10-CM | POA: Diagnosis not present

## 2024-02-28 DIAGNOSIS — H25043 Posterior subcapsular polar age-related cataract, bilateral: Secondary | ICD-10-CM | POA: Diagnosis not present

## 2024-02-28 DIAGNOSIS — H2513 Age-related nuclear cataract, bilateral: Secondary | ICD-10-CM | POA: Diagnosis not present

## 2024-02-29 ENCOUNTER — Ambulatory Visit: Admitting: Medical

## 2024-02-29 ENCOUNTER — Encounter: Payer: Self-pay | Admitting: Medical

## 2024-02-29 VITALS — BP 130/80 | HR 70 | Ht 66.0 in | Wt 238.0 lb

## 2024-02-29 DIAGNOSIS — R7303 Prediabetes: Secondary | ICD-10-CM | POA: Diagnosis not present

## 2024-02-29 DIAGNOSIS — Z124 Encounter for screening for malignant neoplasm of cervix: Secondary | ICD-10-CM

## 2024-02-29 DIAGNOSIS — R61 Generalized hyperhidrosis: Secondary | ICD-10-CM | POA: Diagnosis not present

## 2024-02-29 NOTE — Progress Notes (Signed)
 Subjective:  Molly Hanson is a 68 y.o. female who presents for Chief Complaint  Patient presents with   Night Sweats    Patient has been having night sweats for a little over a month. Has bronchitis about 3 weeks ago and after prednisone she noticed it worsened.      Here for some concerns.     Georganna Skeans, MD - PCP  Having some nights for about a month.  No fevers, no new lumps or lymph nodes swollen, no change in appetite, no body aches or chills. LMP over 10 years.   No recent weight changes.  Eats some spicy foods.  She had a recent bronchitis but the night sweats was prior to this.  No new skin changes.  She also has prior abnormal blood sugars, prediabetes.  She does not eat a lot of sweets and junk food.  She is to be careful with the diet.  She exercises some but could do more.  No prior medication for elevated blood sugars  She got over recent bronchitis.  She was on prednisone and antibiotics recently, finished out that treatment but had night sweats going on before bronchitis symptoms.  No other aggravating or relieving factors.    No other c/o.  Past Medical History:  Diagnosis Date   Arthritis    knees, arm    Hyperlipidemia    has been elevated in the past - no meds    Vitamin D deficiency    Current Outpatient Medications on File Prior to Visit  Medication Sig Dispense Refill   albuterol (VENTOLIN HFA) 108 (90 Base) MCG/ACT inhaler Inhale 2 puffs into the lungs every 6 (six) hours as needed for wheezing or shortness of breath. (Patient not taking: Reported on 02/29/2024) 8 g 2   No current facility-administered medications on file prior to visit.     The following portions of the patient's history were reviewed and updated as appropriate: allergies, current medications, past family history, past medical history, past social history, past surgical history and problem list.  ROS Otherwise as in subjective above    Objective: BP 130/80   Pulse 70   Ht 5'  6" (1.676 m)   Wt 238 lb (108 kg)   SpO2 97%   BMI 38.41 kg/m   Wt Readings from Last 3 Encounters:  02/29/24 238 lb (108 kg)  02/09/24 236 lb (107 kg)  12/07/23 236 lb 3.2 oz (107.1 kg)   BP Readings from Last 3 Encounters:  02/29/24 130/80  02/09/24 127/72  12/07/23 131/76   General appearance: alert, no distress, well developed, well nourished HEENT: normocephalic, sclerae anicteric, conjunctiva pink and moist Oral cavity: MMM, no lesions Neck: supple, no lymphadenopathy, no thyromegaly, no masses Heart: RRR, normal S1, S2, no murmurs Lungs: CTA bilaterally, no wheezes, rhonchi, or rales Pulses: 2+ radial pulses, 2+ pedal pulses, normal cap refill Ext: no edema   Assessment: Encounter Diagnoses  Name Primary?   Night sweat Yes   Prediabetes    Screening for cervical cancer      Plan: We discussed her symptoms and concerns.  We discussed possible causes of night sweats.  Labs as below  Prediabetes Limit your carbs such as rice and bread pasta, limit excess dairy Avoid junk food in general Advised to try to exercise at least 150 minutes/week with cardio and resistance or weight bearing exercise  I do not see her last Pap smear in the chart record.  We discussed her going back  to her PCP and updating her Pap smear if she has not had one in the past 3 to 5 years.  There was a prior listing of hysterectomy in the chart, but she notes no prior hysterectomy and CT scan abdomen pelvis from October 2024 does in fact show a uterus  I reviewed chest x-ray from 02/09/2024 and abdominal x-ray 11/11/2023 in the chart record.  Also reviewed several labs per chart record past year and prior several HgbA1c  Jonet was seen today for night sweats.  Diagnoses and all orders for this visit:  Night sweat -     TSH + free T4 -     QuantiFERON-TB Gold Plus -     CBC -     Comprehensive metabolic panel with GFR  Prediabetes -     Hemoglobin A1c -     Comprehensive metabolic panel  with GFR  Screening for cervical cancer    Follow up: pending labs

## 2024-03-01 LAB — CBC
Hematocrit: 42 % (ref 34.0–46.6)
Hemoglobin: 14.1 g/dL (ref 11.1–15.9)
MCH: 29.6 pg (ref 26.6–33.0)
MCHC: 33.6 g/dL (ref 31.5–35.7)
MCV: 88 fL (ref 79–97)
Platelets: 233 10*3/uL (ref 150–450)
RBC: 4.77 x10E6/uL (ref 3.77–5.28)
RDW: 12.9 % (ref 11.7–15.4)
WBC: 7.4 10*3/uL (ref 3.4–10.8)

## 2024-03-01 LAB — COMPREHENSIVE METABOLIC PANEL WITH GFR
ALT: 18 IU/L (ref 0–32)
AST: 16 IU/L (ref 0–40)
Albumin: 4.3 g/dL (ref 3.9–4.9)
Alkaline Phosphatase: 71 IU/L (ref 44–121)
BUN/Creatinine Ratio: 12 (ref 12–28)
BUN: 11 mg/dL (ref 8–27)
Bilirubin Total: 0.6 mg/dL (ref 0.0–1.2)
CO2: 21 mmol/L (ref 20–29)
Calcium: 9.8 mg/dL (ref 8.7–10.3)
Chloride: 104 mmol/L (ref 96–106)
Creatinine, Ser: 0.95 mg/dL (ref 0.57–1.00)
Globulin, Total: 2.5 g/dL (ref 1.5–4.5)
Glucose: 88 mg/dL (ref 70–99)
Potassium: 4.2 mmol/L (ref 3.5–5.2)
Sodium: 141 mmol/L (ref 134–144)
Total Protein: 6.8 g/dL (ref 6.0–8.5)
eGFR: 66 mL/min/{1.73_m2} (ref >59)

## 2024-03-01 LAB — TSH+FREE T4
Free T4: 1.11 ng/dL (ref 0.82–1.77)
TSH: 1.09 u[IU]/mL (ref 0.450–4.500)

## 2024-03-01 LAB — HEMOGLOBIN A1C
Est. average glucose Bld gHb Est-mCnc: 137 mg/dL
Hgb A1c MFr Bld: 6.4 % — ABNORMAL HIGH (ref 4.8–5.6)

## 2024-03-01 NOTE — Progress Notes (Signed)
 Results sent through MyChart

## 2024-03-24 ENCOUNTER — Ambulatory Visit: Payer: Self-pay

## 2024-03-24 ENCOUNTER — Ambulatory Visit (INDEPENDENT_AMBULATORY_CARE_PROVIDER_SITE_OTHER): Admitting: Family

## 2024-03-24 ENCOUNTER — Encounter: Payer: Self-pay | Admitting: Family

## 2024-03-24 VITALS — BP 115/75 | HR 65 | Temp 97.9°F | Resp 18 | Ht 66.0 in | Wt 233.8 lb

## 2024-03-24 DIAGNOSIS — R42 Dizziness and giddiness: Secondary | ICD-10-CM | POA: Diagnosis not present

## 2024-03-24 DIAGNOSIS — Z23 Encounter for immunization: Secondary | ICD-10-CM | POA: Diagnosis not present

## 2024-03-24 MED ORDER — MECLIZINE HCL 12.5 MG PO TABS: 12.5000 mg | ORAL_TABLET | Freq: Three times a day (TID) | ORAL | 1 refills | Status: DC | PRN

## 2024-03-24 NOTE — Telephone Encounter (Signed)
 Patient given appt for same day

## 2024-03-24 NOTE — Progress Notes (Signed)
 Patient has been dizzy off and on.  Patient thinks its vertigo.  Room spinning and patient unbalance when standing up. Patient need some cram she was taking before.  Patient having trouble sleeping and night sweats

## 2024-03-24 NOTE — Progress Notes (Signed)
 Patient ID: Molly Hanson, female    DOB: 09/19/1956  MRN: [id number]  CC: Dizziness   Subjective: Molly Hanson is a 68 y.o. female who presents for dizziness.   Her concerns today include:  - Reports intermittent dizziness. Denies red flag symptoms. States history of vertigo. She would like to try a medication to see if this helps and a referral to specialist.  - Hot flashes. Declines pharmacological therapy. States she plans to follow-up with Primary Care as needed.   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 (Patient not taking: Reported on 03/24/2024) 8 g 2   No current facility-administered medications on file prior to visit.    No Known Allergies  Social History   Socioeconomic History   Marital status: Single    Spouse name: Not on file   Number of children: Not on file   Years of education: Not on file   Highest education level: Associate degree: occupational, Scientist, product/process development, or vocational program  Occupational History   Not on file  Tobacco Use   Smoking status: Former    Current packs/day: 0.00    Types: Cigarettes    Quit date: 07/15/2020    Years since quitting: 3.6    Passive exposure: Past   Smokeless tobacco: Never  Vaping Use   Vaping status: Never Used  Substance and Sexual Activity   Alcohol use: Yes    Comment: Occassionally.   Drug use: Never   Sexual activity: Not Currently  Other Topics Concern   Not on file  Social History Narrative   Not on file   Social Drivers of Health   Financial Resource Strain: Patient Declined (06/14/2023)    Overall Financial Resource Strain (CARDIA)    Difficulty of Paying Living Expenses: Patient declined  Food Insecurity: Low Risk  (09/09/2023)   Received from [facility]   Hunger Vital Sign    Worried About Running Out of Food in the Last Year: Never true    Ran Out of Food in the Last Year: Never true  Transportation Needs: No Transportation Needs (09/09/2023)   Received from [facility]    In the past 12 months, has lack of reliable transportation kept you from medical appointments, meetings, work or from getting things needed for daily living? : No  Physical Activity: Insufficiently Active (06/14/2023)   Exercise Vital Sign    Days of Exercise per Week: 3 days    Minutes of Exercise per Session: 30 min  Stress: No Stress Concern Present (06/14/2023)   [name] - Occupational Stress Questionnaire    Feeling of Stress : Not at all  Social Connections: Moderately Isolated (06/14/2023)   Social Connection and Isolation Panel [NHANES]    Frequency of Communication with Friends and Family: More than three times a week    Frequency of Social Gatherings with Friends and Family: Twice a week    Attends Religious Services: 1 to 4 times per year    Active Member of Clubs or Organizations: No    Attends  Club or Organization Meetings: Not on file    Marital Status: Divorced  Intimate Partner Violence: Not At Risk (02/09/2024)   Humiliation, Afraid, Rape, and Kick questionnaire    Fear of Current or Ex-Partner: No    Emotionally Abused: No    Physically Abused: No    Sexually Abused: No    Family History  Problem Relation Age of Onset   Breast cancer Mother    Diabetes Mother    Diabetes Father    Diabetes Maternal Grandmother    Stroke Maternal Grandmother    Colon polyps Maternal Grandmother    Colon cancer Maternal Grandfather    Diabetes Paternal Grandmother    Colon polyps Maternal Uncle    Esophageal cancer Neg Hx    Rectal  cancer Neg Hx    Stomach cancer Neg Hx     Past Surgical History:  Procedure Laterality Date   ABDOMINAL HYSTERECTOMY     COLONOSCOPY     > 10 yrs ago- normal     ROS: Review of Systems Negative except as stated above  PHYSICAL EXAM: BP 115/75   Pulse 65   Temp 97.9 F (36.6 C) (Oral)   Resp 18   Ht 5\' 6"  (1.676 m)   Wt 233 lb 12.8 oz (106.1 kg)   SpO2 93%   BMI 37.74 kg/m   Physical Exam HENT:     Head: Normocephalic and atraumatic.     Right Ear: Tympanic membrane, ear canal and external ear normal.     Left Ear: Tympanic membrane, ear canal and external ear normal.     Nose: Nose normal.     Mouth/Throat:     Mouth: Mucous membranes are moist.     Pharynx: Oropharynx is clear.  Eyes:     Extraocular Movements: Extraocular movements intact.     Conjunctiva/sclera: Conjunctivae normal.     Pupils: Pupils are equal, round, and reactive to light.  Cardiovascular:     Rate and Rhythm: Normal rate and regular rhythm.     Pulses: Normal pulses.     Heart sounds: Normal heart sounds.  Pulmonary:     Effort: Pulmonary effort is normal.     Breath sounds: Normal breath sounds.  Musculoskeletal:        General: Normal range of motion.     Cervical back: Normal range of motion and neck supple.  Neurological:     General: No focal deficit present.     Mental Status: She is alert and oriented to person, place, and time.  Psychiatric:        Mood and Affect: Mood normal.        Behavior: Behavior normal.     ASSESSMENT AND PLAN: 1. Vertigo (Primary) - Meclinzine as prescribed. Counseled on medication adherence/adverse effects.  - Referral to ENT for evaluation/management.  - Follow-up with primary provider in 4 weeks or sooner if needed.  - meclizine (ANTIVERT) 12.5 MG tablet; Take 1 tablet (12.5 mg total) by mouth 3 (three) times daily as needed for dizziness.  Dispense: 30 tablet; Refill: 1 - Ambulatory referral to ENT  2. Immunization due - Administered. -  Tdap vaccine greater than or equal to 7yo IM    Patient was given the opportunity to ask questions.  Patient verbalized understanding of the plan and was able to repeat key elements of the plan. Patient was given clear instructions to go to Emergency Department or return to medical center if symptoms don't improve, worsen, or new  problems develop.The patient verbalized understanding.   Orders Placed This Encounter  Procedures   Tdap vaccine greater than or equal to 7yo IM   Ambulatory referral to ENT     Requested Prescriptions   Signed Prescriptions Disp Refills   meclizine (ANTIVERT) 12.5 MG tablet 30 tablet 1    Sig: Take 1 tablet (12.5 mg total) by mouth 3 (three) times daily as needed for dizziness.    Return for Follow-Up or next available with Abraham Abo, MD.  Senaida Dama, NP

## 2024-03-24 NOTE — Telephone Encounter (Signed)
 Summary: Dizziness Advise   Copied From CRM [id number]. Reason for Triage: Patient is calling to report that she is dizzy occasionally Wanting to scheduled an apptointment with ENT with Night Sweats.          Chief Complaint: dizzy spell Symptoms: room spinning Frequency: intermittent Pertinent Negatives: Patient denies headache Disposition: [] ED /[] Urgent Care (no appt availability in office) / [x] Appointment(In office/virtual)/ []  [facility]/ [] Home Care/ [] Refused Recommended Disposition /[] [facility]/ []  Follow-up with PCP Additional Notes: dizziness off/on for 3 weeks, feels like room is spinning to where she has to sit down and get her bearings. Has had issue in the past and it was due to inner ear problems. Appt scheduled for today   Reason for Disposition  [1] MODERATE dizziness (e.g., vertigo; feels very unsteady, interferes with normal activities) AND [2] has been evaluated by doctor (or NP/PA) for this  Answer Assessment - Initial Assessment Questions 1. DESCRIPTION: "Describe your dizziness."     Room spinning  2. VERTIGO: "Do you feel like either you or the room is spinning or tilting?"      Room spinning  3. LIGHTHEADED: "Do you feel lightheaded?" (e.g., somewhat faint, woozy, weak upon standing)     Feels faint sometimes  4. SEVERITY: "How bad is it?"  "Can you walk?"   - MILD: Feels slightly dizzy and unsteady, but is walking normally.   - MODERATE: Feels unsteady when walking, but not falling; interferes with normal activities (e.g., school, work).   - SEVERE: Unable to walk without falling, or requires assistance to walk without falling.     Mild to moderate; normally has to sit down  5. ONSET:  "When did the dizziness begin?"     3 weeks, off/on  6. AGGRAVATING FACTORS: "Does anything make it worse?" (e.g., standing, change in head position)     No factors  7. CAUSE: "What do you think is causing the dizziness?"     Unsure of cause,  possible inner ear  8. RECURRENT SYMPTOM: "Have you had dizziness before?" If Yes, ask: "When was the last time?" "What happened that time?"     No  9. OTHER SYMPTOMS: "Do you have any other symptoms?" (e.g., headache, weakness, numbness, vomiting, earache)     Night sweats  10. PREGNANCY: "Is there any chance you are pregnant?" "When was your last menstrual period?"       No  Protocols used: Dizziness - Vertigo-A-AH

## 2024-03-31 ENCOUNTER — Other Ambulatory Visit (HOSPITAL_COMMUNITY)
Admission: RE | Admit: 2024-03-31 | Discharge: 2024-03-31 | Disposition: A | Discharge status: Home / Self Care | Source: Ambulatory Visit | Attending: Family | Admitting: Family

## 2024-03-31 ENCOUNTER — Encounter: Payer: Self-pay | Admitting: Family

## 2024-03-31 ENCOUNTER — Ambulatory Visit (INDEPENDENT_AMBULATORY_CARE_PROVIDER_SITE_OTHER): Admitting: Family

## 2024-03-31 VITALS — BP 116/77 | HR 70 | Temp 98.5°F | Resp 16 | Ht 66.0 in | Wt 228.2 lb

## 2024-03-31 DIAGNOSIS — Z780 Asymptomatic menopausal state: Secondary | ICD-10-CM | POA: Diagnosis not present

## 2024-03-31 DIAGNOSIS — Z113 Encounter for screening for infections with a predominantly sexual mode of transmission: Secondary | ICD-10-CM | POA: Insufficient documentation

## 2024-03-31 DIAGNOSIS — Z1151 Encounter for screening for human papillomavirus (HPV): Secondary | ICD-10-CM | POA: Insufficient documentation

## 2024-03-31 DIAGNOSIS — Z124 Encounter for screening for malignant neoplasm of cervix: Secondary | ICD-10-CM | POA: Insufficient documentation

## 2024-03-31 DIAGNOSIS — Z114 Encounter for screening for human immunodeficiency virus [HIV]: Secondary | ICD-10-CM

## 2024-03-31 NOTE — Progress Notes (Signed)
 Pap smear, patient needs refill on cream, patient feels bubbles in her abdominal area

## 2024-03-31 NOTE — Progress Notes (Signed)
 Patient ID: Molly Hanson, female    DOB: Jan 02, 1956  MRN: [id number]  CC: Pap Smear   Subjective: Molly Hanson is a 68 y.o. female who presents for pap smear.   Her concerns today include:  - Pap smear.  - Established with Gastroenterology and plans to schedule an appointment soon. - States she needs refills on prescription ointment for skin. States she does not know name of prescription but will send message through MyChart or call office with update once she returns home to look at previous prescription.   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 (Patient not taking: Reported on 03/24/2024) 8 g 2   meclizine  (ANTIVERT ) 12.5 MG tablet Take 1 tablet (12.5 mg total) by mouth 3 (three) times daily as needed for dizziness. (Patient not taking: Reported on 03/31/2024) 30 tablet 1   No current facility-administered medications on file prior to visit.    No Known Allergies  Social History   Socioeconomic History   Marital status: Single    Spouse name: Not on file   Number of children: Not on file   Years of education: Not on file   Highest education level: Associate degree: occupational, Scientist, product/process development, or vocational program  Occupational History   Not on file  Tobacco Use   Smoking status: Former    Current packs/day: 0.00    Types: Cigarettes    Quit date: 07/15/2020    Years since quitting: 3.7    Passive exposure: Past   Smokeless tobacco: Never  Vaping Use   Vaping status: Never Used  Substance and Sexual Activity   Alcohol use: Yes    Comment: Occassionally.    Drug use: Never   Sexual activity: Not Currently  Other Topics Concern   Not on file  Social History Narrative   Not on file   Social Drivers of Health   Financial Resource Strain: Patient Declined (06/14/2023)   Overall Financial Resource Strain (CARDIA)    Difficulty of Paying Living Expenses: Patient declined  Food Insecurity: Low Risk  (09/09/2023)   Received from [facility]   Hunger Vital Sign    Worried About Running Out of Food in the Last Year: Never true    Ran Out of Food in the Last Year: Never true  Transportation Needs: No Transportation Needs (09/09/2023)   Received from [facility]    In the past 12 months, has lack of reliable transportation kept you from medical appointments, meetings, work or from getting things needed for daily living? : No  Physical Activity: Insufficiently Active (06/14/2023)   Exercise Vital Sign    Days of Exercise per Week: 3 days    Minutes of Exercise per Session: 30 min  Stress: No Stress Concern Present (06/14/2023)   [employer]ational [employer] - Occupational Stress Questionnaire    Feeling of Stress : Not at all  Social Connections: Moderately Isolated (06/14/2023)   Social Connection and Isolation Panel [NHANES]    Frequency of Communication with Friends and Family: More than three times a  week    Frequency of Social Gatherings with Friends and Family: Twice a week    Attends Religious Services: 1 to 4 times per year    Active Member of [employer] or Organizations: No    Attends Engineer, structural: Not on file    Marital Status: Divorced  Intimate Partner Violence: Not At Risk (02/09/2024)   Humiliation, Afraid, Rape, and Kick questionnaire    Fear of Current or Ex-Partner: No    Emotionally Abused: No    Physically Abused: No    Sexually Abused: No    Family History  Problem Relation Age of Onset   Breast cancer Mother    Diabetes Mother    Diabetes Father    Diabetes Maternal  Grandmother    Stroke Maternal Grandmother    Colon polyps Maternal Grandmother    Colon cancer Maternal Grandfather    Diabetes Paternal Grandmother    Colon polyps Maternal Uncle    Esophageal cancer Neg Hx    Rectal cancer Neg Hx    Stomach cancer Neg Hx     Past Surgical History:  Procedure Laterality Date   ABDOMINAL HYSTERECTOMY     COLONOSCOPY     > 10 yrs ago- normal     ROS: Review of Systems Negative except as stated above  PHYSICAL EXAM: BP 116/77   Pulse 70   Temp 98.5 F (36.9 C) (Oral)   Resp 16   Ht 5\' 6"  (1.676 m)   Wt 228 lb 3.2 oz (103.5 kg)   SpO2 92%   BMI 36.83 kg/m   Physical Exam HENT:     Head: Normocephalic and atraumatic.     Nose: Nose normal.     Mouth/Throat:     Mouth: Mucous membranes are moist.     Pharynx: Oropharynx is clear.  Eyes:     Extraocular Movements: Extraocular movements intact.     Conjunctiva/sclera: Conjunctivae normal.     Pupils: Pupils are equal, round, and reactive to light.  Cardiovascular:     Rate and Rhythm: Normal rate and regular rhythm.     Pulses: Normal pulses.     Heart sounds: Normal heart sounds.  Pulmonary:     Effort: Pulmonary effort is normal.     Breath sounds: Normal breath sounds.  Chest:  Breasts:    Right: Normal.     Left: Normal.     Comments: Alona Jamaica, CMA present. Genitourinary:    General: Normal vulva.     Vagina: Normal.     Cervix: Normal.     Uterus: Normal.      Adnexa: Right adnexa normal and left adnexa normal.     Comments: Alona Jamaica, CMA present. Musculoskeletal:        General: Normal range of motion.     Cervical back: Normal range of motion and neck supple.  Neurological:     General: No focal deficit present.     Mental Status: She is alert and oriented to person, place, and time.  Psychiatric:        Mood and Affect: Mood normal.        Behavior: Behavior normal.     ASSESSMENT AND PLAN: 1. Cervical cancer screening (Primary) - Routine  screening.  - Cytology - PAP([place])  2. Routine screening for STI (sexually transmitted infection) - Routine screening.  - Cervicovaginal ancillary only  3. Encounter for screening for HIV - Routine screening.  - HIV antibody (with reflex)  Patient was given the opportunity to ask questions.  Patient verbalized understanding of the plan and was able to repeat key elements of the plan. Patient was given clear instructions to go to Emergency Department or return to medical center if symptoms don't improve, worsen, or new problems develop.The patient verbalized understanding.   Orders Placed This Encounter  Procedures   HIV antibody (with reflex)    Return for Follow-Up or next available with Abraham Abo, MD.  Senaida Dama, NP

## 2024-04-01 LAB — HIV ANTIBODY (ROUTINE TESTING W REFLEX): HIV Screen 4th Generation wRfx: NONREACTIVE

## 2024-04-03 ENCOUNTER — Other Ambulatory Visit: Payer: Self-pay | Admitting: Family

## 2024-04-03 ENCOUNTER — Encounter: Payer: Self-pay | Admitting: Family

## 2024-04-03 DIAGNOSIS — B9689 Other specified bacterial agents as the cause of diseases classified elsewhere: Secondary | ICD-10-CM

## 2024-04-03 LAB — CERVICOVAGINAL ANCILLARY ONLY
Bacterial Vaginitis (gardnerella): POSITIVE — AB
Candida Glabrata: NEGATIVE
Candida Vaginitis: NEGATIVE
Chlamydia: NEGATIVE
Comment: NEGATIVE
Comment: NEGATIVE
Comment: NEGATIVE
Comment: NEGATIVE
Comment: NEGATIVE
Comment: NORMAL
Neisseria Gonorrhea: NEGATIVE
Trichomonas: NEGATIVE

## 2024-04-03 MED ORDER — METRONIDAZOLE 500 MG PO TABS
500.0000 mg | ORAL_TABLET | Freq: Two times a day (BID) | ORAL | 0 refills | Status: AC
Start: 2024-04-03 — End: 2024-04-10

## 2024-04-04 ENCOUNTER — Telehealth: Payer: Self-pay | Admitting: Family Medicine

## 2024-04-04 ENCOUNTER — Ambulatory Visit: Payer: Self-pay | Admitting: Family

## 2024-04-04 DIAGNOSIS — R42 Dizziness and giddiness: Secondary | ICD-10-CM | POA: Insufficient documentation

## 2024-04-04 LAB — CYTOLOGY - PAP
Adequacy: ABSENT
Comment: NEGATIVE
Diagnosis: NEGATIVE
High risk HPV: NEGATIVE

## 2024-04-04 NOTE — Telephone Encounter (Signed)
 Med refill for clotrimazole  and betamethasone  Dipropionate sent to pharmacy.

## 2024-04-04 NOTE — Telephone Encounter (Signed)
 Schedule appointment?

## 2024-04-05 ENCOUNTER — Other Ambulatory Visit: Payer: Self-pay | Admitting: Family

## 2024-04-05 DIAGNOSIS — D229 Melanocytic nevi, unspecified: Secondary | ICD-10-CM

## 2024-04-05 NOTE — Telephone Encounter (Signed)
 Complete

## 2024-04-06 NOTE — Telephone Encounter (Signed)
 Patient scheduled.

## 2024-04-10 ENCOUNTER — Encounter: Admitting: Family

## 2024-04-10 NOTE — Progress Notes (Signed)
 Erroneous encounter-disregard

## 2024-04-24 DIAGNOSIS — R42 Dizziness and giddiness: Secondary | ICD-10-CM | POA: Diagnosis not present

## 2024-04-24 DIAGNOSIS — R2689 Other abnormalities of gait and mobility: Secondary | ICD-10-CM | POA: Diagnosis not present

## 2024-04-27 DIAGNOSIS — H2511 Age-related nuclear cataract, right eye: Secondary | ICD-10-CM | POA: Diagnosis not present

## 2024-04-28 DIAGNOSIS — H25813 Combined forms of age-related cataract, bilateral: Secondary | ICD-10-CM | POA: Diagnosis not present

## 2024-05-01 DIAGNOSIS — H9201 Otalgia, right ear: Secondary | ICD-10-CM | POA: Insufficient documentation

## 2024-05-01 DIAGNOSIS — M2669 Other specified disorders of temporomandibular joint: Secondary | ICD-10-CM | POA: Diagnosis not present

## 2024-05-03 ENCOUNTER — Encounter: Payer: Self-pay | Admitting: Family

## 2024-05-03 ENCOUNTER — Ambulatory Visit (INDEPENDENT_AMBULATORY_CARE_PROVIDER_SITE_OTHER): Admitting: Family

## 2024-05-03 VITALS — BP 112/75 | HR 103 | Temp 98.4°F | Resp 18 | Ht 66.0 in | Wt 231.2 lb

## 2024-05-03 DIAGNOSIS — R109 Unspecified abdominal pain: Secondary | ICD-10-CM | POA: Diagnosis not present

## 2024-05-03 DIAGNOSIS — G8929 Other chronic pain: Secondary | ICD-10-CM

## 2024-05-03 DIAGNOSIS — R21 Rash and other nonspecific skin eruption: Secondary | ICD-10-CM | POA: Diagnosis not present

## 2024-05-03 MED ORDER — CLOTRIMAZOLE-BETAMETHASONE 1-0.05 % EX CREA: 1.0000 | TOPICAL_CREAM | Freq: Every day | CUTANEOUS | 2 refills | Status: DC

## 2024-05-03 NOTE — Progress Notes (Signed)
 Medication refill last prescribe by [facility]

## 2024-05-03 NOTE — Progress Notes (Signed)
 Patient ID: Molly Hanson, female    DOB: January 20, 1956  MRN: [id number]  CC: Chronic Conditions Follow-Up  Subjective: Molly Hanson is a 68 y.o. female who presents for chronic conditions follow-up.  Her concerns today include:  - Chronic stomach pain persisting. Denies red flag symptoms. She is established with Gastroenterology and states she was recently seen by the same and told nothing was wrong after a CT scan was completed. She would like to have an ultrasound of the stomach. - Patient states she sent a picture through MyChart of requested cream (prescription) for her rash. Molly Hanson, CMA and myself were unable to find the picture patient was referring to. Patient told Molly Hanson, CMA that the name of the cream (prescription) is Molly Hanson. When I went to prescribed the requested medication there was not an available option in the medical system to order.   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 (Patient not taking: Reported on 03/24/2024) 8 g 2   meclizine  (ANTIVERT ) 12.5 MG tablet Take 1 tablet (12.5 mg total) by mouth 3 (three) times daily as needed for dizziness. (Patient not taking: Reported on 03/31/2024) 30 tablet 1   No current facility-administered medications on file prior to visit.    No Known Allergies  Social History   Socioeconomic History   Marital status: Single    Spouse name: Not on file   Number of children: Not on file   Years of education: Not on file   Highest education level: Associate degree: academic program  Occupational  History   Not on file  Tobacco Use   Smoking status: Former    Current packs/day: 0.00    Types: Cigarettes    Quit date: 07/15/2020    Years since quitting: 3.8    Passive exposure: Past   Smokeless tobacco: Never  Vaping Use   Vaping status: Never Used  Substance and Sexual Activity   Alcohol use: Yes    Comment: Occassionally.   Drug use: Never   Sexual activity: Not Currently  Other Topics Concern   Not on file  Social History Narrative   Not on file   Social Drivers of Health   Financial Resource Strain: Medium Risk (04/29/2024)   Overall Financial Resource Strain (CARDIA)    Difficulty of Paying Living Expenses: Somewhat hard  Food Insecurity: Food Insecurity Present (04/29/2024)   Hunger Vital Sign    Worried About Running Out of Food in the Last Year: Sometimes true    Ran Out of Food in the Last Year: Never true  Transportation Needs: No Transportation Needs (04/29/2024)   PRAPARE - Administrator, Civil Service (Medical): No    Lack of Transportation (Non-Medical): No  Physical Activity: Insufficiently Active (04/29/2024)   Exercise Vital Sign    Days of Exercise per Week: 3 days    Minutes of Exercise per Session: 30 min  Stress: No Stress Concern Present (04/29/2024)   [name] - Occupational Stress Questionnaire    Feeling of Stress : Only a  little  Recent Concern: Stress - Stress Concern Present (04/09/2024)   [name] - Occupational Stress Questionnaire    Feeling of Stress : To some extent  Social Connections: Moderately Integrated (04/29/2024)   Social Connection and Isolation Panel [NHANES]    Frequency of Communication with Friends and Family: More than three times a week    Frequency of Social Gatherings with Friends and Family: Once a week    Attends Religious Services: More than 4 times per year    Active Member of [employer] or Organizations: Yes    Attends Engineer, structural:  More than 4 times per year    Marital Status: Divorced  Intimate Partner Violence: Not At Risk (02/09/2024)   Humiliation, Afraid, Rape, and Kick questionnaire    Fear of Current or Ex-Partner: No    Emotionally Abused: No    Physically Abused: No    Sexually Abused: No    Family History  Problem Relation Age of Onset   Breast cancer Mother    Diabetes Mother    Diabetes Father    Diabetes Maternal Grandmother    Stroke Maternal Grandmother    Colon polyps Maternal Grandmother    Colon cancer Maternal Grandfather    Diabetes Paternal Grandmother    Colon polyps Maternal Uncle    Esophageal cancer Neg Hx    Rectal cancer Neg Hx    Stomach cancer Neg Hx     Past Surgical History:  Procedure Laterality Date   ABDOMINAL HYSTERECTOMY     COLONOSCOPY     > 10 yrs ago- normal     ROS: Review of Systems Negative except as stated above  PHYSICAL EXAM: BP 112/75   Pulse (!) 103   Temp 98.4 F (36.9 C) (Oral)   Resp 18   Ht 5' 6 (1.676 m)   Wt 231 lb 3.2 oz (104.9 kg)   SpO2 98%   BMI 37.32 kg/m   Physical Exam HENT:     Head: Normocephalic and atraumatic.     Nose: Nose normal.     Mouth/Throat:     Mouth: Mucous membranes are moist.     Pharynx: Oropharynx is clear.  Eyes:     Extraocular Movements: Extraocular movements intact.     Conjunctiva/sclera: Conjunctivae normal.     Pupils: Pupils are equal, round, and reactive to light.  Cardiovascular:     Rate and Rhythm: Tachycardia present.     Pulses: Normal pulses.     Heart sounds: Normal heart sounds.  Pulmonary:     Effort: Pulmonary effort is normal.     Breath sounds: Normal breath sounds.  Abdominal:     General: Bowel sounds are normal.     Palpations: Abdomen is soft.  Musculoskeletal:        General: Normal range of motion.     Cervical back: Normal range of motion and neck supple.  Neurological:     General: No focal deficit present.     Mental Status: She is alert and oriented to person,  place, and time.  Psychiatric:        Mood and Affect: Mood normal.        Behavior: Behavior normal.     ASSESSMENT AND PLAN: 1. Chronic abdominal pain (Primary) - Ultrasound Abdomen Complete for evaluation. - Keep all scheduled appointments with Gastroenterology. - US  Abdomen Complete; Future  2. Rash and nonspecific skin eruption - Clotrimazole -Betamethasone  as prescribed. Counseled on medication adherence/adverse effects.  -  Follow-up with primary provider as scheduled. - clotrimazole -betamethasone  (LOTRISONE ) cream; Apply 1 Application topically daily.  Dispense: 45 g; Refill: 2   Patient was given the opportunity to ask questions.  Patient verbalized understanding of the plan and was able to repeat key elements of the plan. Patient was given clear instructions to go to Emergency Department or return to medical center if symptoms don't improve, worsen, or new problems develop.The patient verbalized understanding.   Orders Placed This Encounter  Procedures   US  Abdomen Complete     Requested Prescriptions   Signed Prescriptions Disp Refills   clotrimazole -betamethasone  (LOTRISONE ) cream 45 g 2    Sig: Apply 1 Application topically daily.    Follow-up with primary provider as scheduled.  Senaida Dama, NP

## 2024-05-05 ENCOUNTER — Ambulatory Visit
Admission: RE | Admit: 2024-05-05 | Discharge: 2024-05-05 | Disposition: A | Discharge status: Home / Self Care | Source: Ambulatory Visit | Attending: Family | Admitting: Family

## 2024-05-05 DIAGNOSIS — R109 Unspecified abdominal pain: Secondary | ICD-10-CM | POA: Diagnosis not present

## 2024-05-05 DIAGNOSIS — G8929 Other chronic pain: Secondary | ICD-10-CM | POA: Diagnosis not present

## 2024-05-08 ENCOUNTER — Ambulatory Visit: Payer: Self-pay | Admitting: Family

## 2024-05-09 DIAGNOSIS — D492 Neoplasm of unspecified behavior of bone, soft tissue, and skin: Secondary | ICD-10-CM | POA: Diagnosis not present

## 2024-05-09 DIAGNOSIS — D2372 Other benign neoplasm of skin of left lower limb, including hip: Secondary | ICD-10-CM | POA: Diagnosis not present

## 2024-05-09 DIAGNOSIS — L821 Other seborrheic keratosis: Secondary | ICD-10-CM | POA: Diagnosis not present

## 2024-05-09 DIAGNOSIS — L538 Other specified erythematous conditions: Secondary | ICD-10-CM | POA: Diagnosis not present

## 2024-05-10 ENCOUNTER — Ambulatory Visit (INDEPENDENT_AMBULATORY_CARE_PROVIDER_SITE_OTHER): Admitting: Family

## 2024-05-10 VITALS — BP 119/75 | HR 74 | Temp 98.0°F | Resp 16 | Ht 66.0 in | Wt 231.2 lb

## 2024-05-10 DIAGNOSIS — Z13220 Encounter for screening for lipoid disorders: Secondary | ICD-10-CM | POA: Diagnosis not present

## 2024-05-10 DIAGNOSIS — Z0000 Encounter for general adult medical examination without abnormal findings: Secondary | ICD-10-CM | POA: Diagnosis not present

## 2024-05-10 DIAGNOSIS — Z13820 Encounter for screening for osteoporosis: Secondary | ICD-10-CM | POA: Diagnosis not present

## 2024-05-10 DIAGNOSIS — Z1329 Encounter for screening for other suspected endocrine disorder: Secondary | ICD-10-CM | POA: Diagnosis not present

## 2024-05-10 DIAGNOSIS — Z131 Encounter for screening for diabetes mellitus: Secondary | ICD-10-CM

## 2024-05-10 DIAGNOSIS — Z13228 Encounter for screening for other metabolic disorders: Secondary | ICD-10-CM

## 2024-05-10 DIAGNOSIS — Z712 Person consulting for explanation of examination or test findings: Secondary | ICD-10-CM

## 2024-05-10 DIAGNOSIS — Z130 Encounter for screening for diseases of the blood and blood-forming organs and certain disorders involving the immune mechanism: Secondary | ICD-10-CM | POA: Diagnosis not present

## 2024-05-10 NOTE — Progress Notes (Deleted)
 Subjective:   Molly Hanson is a 68 y.o. female who presents for an Initial Medicare Annual Wellness Visit.  Visit Complete: In person  Patient Medicare AWV questionnaire was completed by the patient on ; I have confirmed that all information answered by patient is correct and no changes since this date.        Objective:    There were no vitals filed for this visit. There is no height or weight on file to calculate BMI.     08/31/2023    9:59 AM  Advanced Directives  Does Patient Have a Medical Advance Directive? No  Would patient like information on creating a medical advance directive? No - Patient declined    Current Medications (verified) Outpatient Encounter Medications as of 05/10/2024  Medication Sig   albuterol  (VENTOLIN  HFA) 108 (90 Base) MCG/ACT inhaler Inhale 2 puffs into the lungs every 6 (six) hours as needed for wheezing or shortness of breath. (Patient not taking: Reported on 03/24/2024)   clotrimazole -betamethasone  (LOTRISONE ) cream Apply 1 Application topically daily.   meclizine  (ANTIVERT ) 12.5 MG tablet Take 1 tablet (12.5 mg total) by mouth 3 (three) times daily as needed for dizziness. (Patient not taking: Reported on 03/31/2024)   No facility-administered encounter medications on file as of 05/10/2024.    Allergies (verified) Patient has no known allergies.   History: Past Medical History:  Diagnosis Date   Arthritis    knees, arm    Hyperlipidemia    has been elevated in the past - no meds    Vitamin D  deficiency    Past Surgical History:  Procedure Laterality Date   ABDOMINAL HYSTERECTOMY     COLONOSCOPY     > 10 yrs ago- normal    Family History  Problem Relation Age of Onset   Breast cancer Mother    Diabetes Mother    Diabetes Father    Diabetes Maternal Grandmother    Stroke Maternal Grandmother    Colon polyps Maternal Grandmother    Colon cancer Maternal Grandfather    Diabetes Paternal Grandmother    Colon polyps Maternal  Uncle    Esophageal cancer Neg Hx    Rectal cancer Neg Hx    Stomach cancer Neg Hx    Social History   Socioeconomic History   Marital status: Single    Spouse name: Not on file   Number of children: Not on file   Years of education: Not on file   Highest education level: Associate degree: academic program  Occupational History   Not on file  Tobacco Use   Smoking status: Former    Current packs/day: 0.00    Types: Cigarettes    Quit date: 07/15/2020    Years since quitting: 3.8    Passive exposure: Past   Smokeless tobacco: Never  Vaping Use   Vaping status: Never Used  Substance and Sexual Activity   Alcohol use: Yes    Comment: Occassionally.   Drug use: Never   Sexual activity: Not Currently  Other Topics Concern   Not on file  Social History Narrative   Not on file   Social Drivers of Health   Financial Resource Strain: Medium Risk (04/29/2024)   Overall Financial Resource Strain (CARDIA)    Difficulty of Paying Living Expenses: Somewhat hard  Food Insecurity: Food Insecurity Present (04/29/2024)   Hunger Vital Sign    Worried About Running Out of Food in the Last Year: Sometimes true    Ran Out of  Food in the Last Year: Never true  Transportation Needs: No Transportation Needs (04/29/2024)   PRAPARE - Administrator, Civil Service (Medical): No    Lack of Transportation (Non-Medical): No  Physical Activity: Insufficiently Active (04/29/2024)   Exercise Vital Sign    Days of Exercise per Week: 3 days    Minutes of Exercise per Session: 30 min  Stress: No Stress Concern Present (04/29/2024)   [name] - Occupational Stress Questionnaire    Feeling of Stress : Only a little  Recent Concern: Stress - Stress Concern Present (04/09/2024)   [name] - Occupational Stress Questionnaire    Feeling of Stress : To some extent  Social Connections: Moderately Integrated (04/29/2024)   Social Connection  and Isolation Panel    Frequency of Communication with Friends and Family: More than three times a week    Frequency of Social Gatherings with Friends and Family: Once a week    Attends Religious Services: More than 4 times per year    Active Member of [employer] or Organizations: Yes    Attends Engineer, structural: More than 4 times per year    Marital Status: Divorced    Tobacco Counseling Counseling given: Not Answered   Clinical Intake:                        Activities of Daily Living     No data to display          Patient Care Team: Senaida Dama, NP as PCP - General (Nurse Practitioner)  Indicate any recent Medical Services you may have received from other than [facility] providers in the past year (date may be approximate).     Assessment:   This is a routine wellness examination for [place].  Hearing/Vision screen No results found.   Goals Addressed   None    Depression Screen    05/03/2024    2:28 PM 03/31/2024    2:38 PM 03/24/2024    2:50 PM 02/09/2024   10:29 AM 12/02/2023    3:25 PM 06/16/2023    9:27 AM 05/26/2023    9:47 AM  PHQ 2/9 Scores  PHQ - 2 Score 0 0 0 0 0 0 0  PHQ- 9 Score 2   0  0 0    Fall Risk    05/03/2024    2:28 PM 02/09/2024   10:29 AM 12/07/2023    3:52 PM 12/02/2023    3:25 PM 05/26/2023    9:47 AM  Fall Risk   Falls in the past year? 0 0 0 0 0  Number falls in past yr: 0 0 0 0 0  Injury with Fall? 0 0 0 0 0  Risk for fall due to : No Fall Risks No Fall Risks No Fall Risks No Fall Risks   Follow up Falls evaluation completed Falls evaluation completed Falls evaluation completed Falls evaluation completed     MEDICARE RISK AT HOME:    TIMED UP AND GO:  Was the test performed? Yes  Length of time to ambulate 10 feet: 5 sec Gait steady and fast without use of assistive device    Cognitive Function:        Immunizations Immunization History  Administered Date(s) Administered   Influenza,inj,Quad PF,6+  Mos 08/04/2022   Tdap 03/24/2024    TDAP status: Up to date  Flu Vaccine status: Up to date  Pneumococcal vaccine status: Declined,  Education has been provided regarding the importance of this vaccine but patient still declined. Advised may receive this vaccine at local pharmacy or Health Dept. Aware to provide a copy of the vaccination record if obtained from local pharmacy or Health Dept. Verbalized acceptance and understanding.   Covid-19 vaccine status: Declined, Education has been provided regarding the importance of this vaccine but patient still declined. Advised may receive this vaccine at local pharmacy or Health Dept.or vaccine clinic. Aware to provide a copy of the vaccination record if obtained from local pharmacy or Health Dept. Verbalized acceptance and understanding.  Qualifies for Shingles Vaccine? Yes   Zostavax completed No   Shingrix Completed?: No.    Education has been provided regarding the importance of this vaccine. Patient has been advised to call insurance company to determine out of pocket expense if they have not yet received this vaccine. Advised may also receive vaccine at local pharmacy or Health Dept. Verbalized acceptance and understanding.  Screening Tests Health Maintenance  Topic Date Due   Medicare Annual Wellness (AWV)  Never done   DEXA SCAN  Never done   COVID-19 Vaccine (1 - 2024-25 season) 05/19/2024 (Originally 07/25/2023)   Zoster Vaccines- Shingrix (1 of 2) 06/24/2024 (Originally 08/11/2008)   Pneumococcal Vaccine: 50+ Years (1 of 1 - PCV) 03/24/2025 (Originally 08/11/2008)   INFLUENZA VACCINE  06/23/2024   MAMMOGRAM  09/19/2025   Colonoscopy  12/04/2025   Cervical Cancer Screening (HPV/Pap Cotest)  03/31/2029   Hepatitis C Screening  Completed   HIV Screening  Completed   HPV VACCINES  Aged Out   Meningococcal B Vaccine  Aged Out   DTaP/Tdap/Td  Discontinued    Health Maintenance  Health Maintenance Due  Topic Date Due   Medicare  Annual Wellness (AWV)  Never done   DEXA SCAN  Never done    Colorectal cancer screening: Type of screening: Colonoscopy. Completed 2022. Repeat every   years  Mammogram status: Completed  . Repeat every year  Bone Density status: Ordered  . Pt provided with contact info and advised to call to schedule appt.  Lung Cancer Screening: (Low Dose CT Chest recommended if Age 42-80 years, 20 pack-year currently smoking OR have quit w/in 15years.)  qualify.   Lung Cancer Screening Referral:   Additional Screening:  Hepatitis C Screening: does qualify; Completed 2023  Vision Screening: Recommended annual ophthalmology exams for early detection of glaucoma and other disorders of the eye. Is the patient up to date with their annual eye exam?  No  Who is the provider or what is the name of the office in which the patient attends annual eye exams?  If pt is not established with a provider, would they like to be referred to a provider to establish care? .   Dental Screening: Recommended annual dental exams for proper oral hygiene  Diabetic Foot Exam:   Community Resource Referral / Chronic Care Management: CRR required this visit?  No   CCM required this visit?  No     Plan:     I have personally reviewed and noted the following in the patient's chart:   Medical and social history Use of alcohol, tobacco or illicit drugs  Current medications and supplements including opioid prescriptions. Patient is not currently taking opioid prescriptions. Functional ability and status Nutritional status Physical activity Advanced directives List of other physicians Hospitalizations, surgeries, and ER visits in previous 12 months Vitals Screenings to include cognitive, depression, and falls  Referrals and appointments  In addition, I have reviewed and discussed with patient certain preventive protocols, quality metrics, and best practice recommendations. A written personalized care plan for  preventive services as well as general preventive health recommendations were provided to patient.     Ardelia Kohut, RMA   05/10/2024   After Visit Summary: (In Person-Printed) AVS printed and given to the patient  Nurse Notes: ***       Subjective:   Molly Hanson is a 68 y.o. female who presents for Medicare Annual (Subsequent) preventive examination.  Visit Complete: {VISITMETHODVS:[id number]}  Patient Medicare AWV questionnaire was completed by the patient on ***; I have confirmed that all information answered by patient is correct and no changes since this date.        Objective:    There were no vitals filed for this visit. There is no height or weight on file to calculate BMI.     08/31/2023    9:59 AM  Advanced Directives  Does Patient Have a Medical Advance Directive? No  Would patient like information on creating a medical advance directive? No - Patient declined    Current Medications (verified) Outpatient Encounter Medications as of 05/10/2024  Medication Sig   albuterol  (VENTOLIN  HFA) 108 (90 Base) MCG/ACT inhaler Inhale 2 puffs into the lungs every 6 (six) hours as needed for wheezing or shortness of breath. (Patient not taking: Reported on 03/24/2024)   clotrimazole -betamethasone  (LOTRISONE ) cream Apply 1 Application topically daily.   meclizine  (ANTIVERT ) 12.5 MG tablet Take 1 tablet (12.5 mg total) by mouth 3 (three) times daily as needed for dizziness. (Patient not taking: Reported on 03/31/2024)   No facility-administered encounter medications on file as of 05/10/2024.    Allergies (verified) Patient has no known allergies.   History: Past Medical History:  Diagnosis Date   Arthritis    knees, arm    Hyperlipidemia    has been elevated in the past - no meds    Vitamin D  deficiency    Past Surgical History:  Procedure Laterality Date   ABDOMINAL HYSTERECTOMY     COLONOSCOPY     > 10 yrs ago- normal    Family History  Problem Relation  Age of Onset   Breast cancer Mother    Diabetes Mother    Diabetes Father    Diabetes Maternal Grandmother    Stroke Maternal Grandmother    Colon polyps Maternal Grandmother    Colon cancer Maternal Grandfather    Diabetes Paternal Grandmother    Colon polyps Maternal Uncle    Esophageal cancer Neg Hx    Rectal cancer Neg Hx    Stomach cancer Neg Hx    Social History   Socioeconomic History   Marital status: Single    Spouse name: Not on file   Number of children: Not on file   Years of education: Not on file   Highest education level: Associate degree: academic program  Occupational History   Not on file  Tobacco Use   Smoking status: Former    Current packs/day: 0.00    Types: Cigarettes    Quit date: 07/15/2020    Years since quitting: 3.8    Passive exposure: Past   Smokeless tobacco: Never  Vaping Use   Vaping status: Never Used  Substance and Sexual Activity   Alcohol use: Yes    Comment: Occassionally.   Drug use: Never   Sexual activity: Not Currently  Other Topics Concern   Not on  file  Social History Narrative   Not on file   Social Drivers of Health   Financial Resource Strain: Medium Risk (04/29/2024)   Overall Financial Resource Strain (CARDIA)    Difficulty of Paying Living Expenses: Somewhat hard  Food Insecurity: Food Insecurity Present (04/29/2024)   Hunger Vital Sign    Worried About Running Out of Food in the Last Year: Sometimes true    Ran Out of Food in the Last Year: Never true  Transportation Needs: No Transportation Needs (04/29/2024)   PRAPARE - Administrator, Civil Service (Medical): No    Lack of Transportation (Non-Medical): No  Physical Activity: Insufficiently Active (04/29/2024)   Exercise Vital Sign    Days of Exercise per Week: 3 days    Minutes of Exercise per Session: 30 min  Stress: No Stress Concern Present (04/29/2024)   [name] - Occupational Stress Questionnaire    Feeling of  Stress : Only a little  Recent Concern: Stress - Stress Concern Present (04/09/2024)   [name] - Occupational Stress Questionnaire    Feeling of Stress : To some extent  Social Connections: Moderately Integrated (04/29/2024)   Social Connection and Isolation Panel    Frequency of Communication with Friends and Family: More than three times a week    Frequency of Social Gatherings with Friends and Family: Once a week    Attends Religious Services: More than 4 times per year    Active Member of [employer] or Organizations: Yes    Attends Engineer, structural: More than 4 times per year    Marital Status: Divorced    Tobacco Counseling Counseling given: Not Answered   Clinical Intake:                        Activities of Daily Living     No data to display          Patient Care Team: Senaida Dama, NP as PCP - General (Nurse Practitioner)  Indicate any recent Medical Services you may have received from other than [facility] providers in the past year (date may be approximate).     Assessment:   This is a routine wellness examination for [place].  Hearing/Vision screen No results found.   Goals Addressed   None    Depression Screen    05/03/2024    2:28 PM 03/31/2024    2:38 PM 03/24/2024    2:50 PM 02/09/2024   10:29 AM 12/02/2023    3:25 PM 06/16/2023    9:27 AM 05/26/2023    9:47 AM  PHQ 2/9 Scores  PHQ - 2 Score 0 0 0 0 0 0 0  PHQ- 9 Score 2   0  0 0    Fall Risk    05/03/2024    2:28 PM 02/09/2024   10:29 AM 12/07/2023    3:52 PM 12/02/2023    3:25 PM 05/26/2023    9:47 AM  Fall Risk   Falls in the past year? 0 0 0 0 0  Number falls in past yr: 0 0 0 0 0  Injury with Fall? 0 0 0 0 0  Risk for fall due to : No Fall Risks No Fall Risks No Fall Risks No Fall Risks   Follow up Falls evaluation completed Falls evaluation completed Falls evaluation completed Falls evaluation completed     MEDICARE RISK AT HOME:    TIMED  UP AND GO:  Was the test performed?  {AMBTIMEDUPGO:[phone]}    Cognitive Function:        Immunizations Immunization History  Administered Date(s) Administered   Influenza,inj,Quad PF,6+ Mos 08/04/2022   Tdap 03/24/2024    {TDAP status:2101805}  {Flu Vaccine status:2101806}  {Pneumococcal vaccine status:2101807}  {Covid-19 vaccine status:2101808}  Qualifies for Shingles Vaccine? {YES/NO:21197}  Zostavax completed {YES/NO:21197}  {Shingrix Completed?:2101804}  Screening Tests Health Maintenance  Topic Date Due   Medicare Annual Wellness (AWV)  Never done   DEXA SCAN  Never done   COVID-19 Vaccine (1 - 2024-25 season) 05/19/2024 (Originally 07/25/2023)   Zoster Vaccines- Shingrix (1 of 2) 06/24/2024 (Originally 08/11/2008)   Pneumococcal Vaccine: 50+ Years (1 of 1 - PCV) 03/24/2025 (Originally 08/11/2008)   INFLUENZA VACCINE  06/23/2024   MAMMOGRAM  09/19/2025   Colonoscopy  12/04/2025   Cervical Cancer Screening (HPV/Pap Cotest)  03/31/2029   Hepatitis C Screening  Completed   HIV Screening  Completed   HPV VACCINES  Aged Out   Meningococcal B Vaccine  Aged Out   DTaP/Tdap/Td  Discontinued    Health Maintenance  Health Maintenance Due  Topic Date Due   Medicare Annual Wellness (AWV)  Never done   DEXA SCAN  Never done    {Colorectal cancer screening:2101809}  {Mammogram status:21018020}  {Bone Density status:21018021}  Lung Cancer Screening: (Low Dose CT Chest recommended if Age 20-80 years, 20 pack-year currently smoking OR have quit w/in 15years.) {DOES NOT does:27190::does not} qualify.   Lung Cancer Screening Referral: ***  Additional Screening:  Hepatitis C Screening: {DOES NOT does:27190::does not} qualify; Completed ***  Vision Screening: Recommended annual ophthalmology exams for early detection of glaucoma and other disorders of the eye. Is the patient up to date with their annual eye exam?  {YES/NO:21197} Who is the provider or what  is the name of the office in which the patient attends annual eye exams? *** If pt is not established with a provider, would they like to be referred to a provider to establish care? {YES/NO:21197}.   Dental Screening: Recommended annual dental exams for proper oral hygiene  Diabetic Foot Exam: {Diabetic Foot Exam:2101802}  Community Resource Referral / Chronic Care Management: CRR required this visit?  {YES/NO:21197}  CCM required this visit?  {CCM Required [phone]}     Plan:     I have personally reviewed and noted the following in the patient's chart:   Medical and social history Use of alcohol, tobacco or illicit drugs  Current medications and supplements including opioid prescriptions. {Opioid [phone]} Functional ability and status Nutritional status Physical activity Advanced directives List of other physicians Hospitalizations, surgeries, and ER visits in previous 12 months Vitals Screenings to include cognitive, depression, and falls Referrals and appointments  In addition, I have reviewed and discussed with patient certain preventive protocols, quality metrics, and best practice recommendations. A written personalized care plan for preventive services as well as general preventive health recommendations were provided to patient.     Ardelia Kohut, RMA   05/10/2024   After Visit Summary: {CHL AMB AWV After Visit Summary:[phone]}  Nurse Notes: ***

## 2024-05-10 NOTE — Progress Notes (Signed)
 Welcome To Medicare  Molly Hanson is a 68 y.o. female who presents for a Welcome to Medicare exam.   Her issues/concerns for discussion today includes: - Requests physical labs.  - States she did not understand abdominal ultrasound result note.   Patient Active Problem List   Diagnosis Date Noted   Chronic abdominal pain 12/02/2023   Abnormal auditory perception of both ears 09/09/2023   Imbalance 09/09/2023   Hx of adenomatous colonic polyps 04/30/2022    Health Maintenance Due  Topic Date Due   DEXA SCAN  Never done    Health Habits Exercise: reports limited due to recent eye surgery Diet: reports she watches what she eats   Depression Screen Over the past two weeks have you:     Felt down or depressed? no     Had little interest or pleasure in doing things? no     Additional depression screening completed? no. Results:    05/03/2024    2:28 PM 03/31/2024    2:38 PM 03/24/2024    2:50 PM 02/09/2024   10:29 AM 12/02/2023    3:25 PM  Depression screen PHQ 2/9  Decreased Interest 0 0 0 0 0  Down, Depressed, Hopeless 0 0 0 0 0  PHQ - 2 Score 0 0 0 0 0  Altered sleeping 1   0   Tired, decreased energy 1   0   Change in appetite 0   0   Feeling bad or failure about yourself  0   0   Trouble concentrating 0   0   Moving slowly or fidgety/restless 0   0   Suicidal thoughts 0   0   PHQ-9 Score 2   0   Difficult doing work/chores Somewhat difficult   Not difficult at all     Functional Ability Does the patient need help with: Ardell Koller index)         Bathing: no         Dressing : no         Toileting: no         Transferring: no         Continence: No         Feeding: no           Safety Screen Does the home have:         Rugs in the hallway: yes         Grab bars in the bathroom: yes         Handrails on the stairs: yes, outside of home         Stairs in home: no         Poor lightning: no  Hearing Evaluation Do you have trouble hearing the television when  others do not? no Do you have to strain to hear/understand conversations? no   Falls Risk: Does the patient need assistance with ambulation? no Does the patient have a history of a fall in the last 90 days? no Is the patient at risk for falls? no Was the patient's timed Get Up and Go Test unsteady or longer than 30 seconds? no   Advanced Care Planning Patient has executed an Advance Directive: no If no, patient was given the opportunity to execute an Advance Directive today? yes This patient has the ability to prepare an Advance Directive: yes   Cognitive Assessment Does the patient have evidence of cognitive impairment? No The patient does not have evidence of  a change in mood/affect, appearance, speech, memory or motor skills.     PHYSICAL EXAM: Vitals:   05/10/24 1332  BP: 119/75  Pulse: 74  Resp: 16  Temp: 98 F (36.7 C)  TempSrc: Oral  SpO2: 94%  Weight: 231 lb 3.2 oz (104.9 kg)  Height: 5' 6 (1.676 m)   Body mass index is 37.32 kg/m.   Vision screen performed. See vision activity in chart for documentation Other exam performed: none   Assessment: Physical Exam HENT:     Head: Normocephalic and atraumatic.     Right Ear: Tympanic membrane, ear canal and external ear normal.     Left Ear: Tympanic membrane, ear canal and external ear normal.     Nose: Nose normal.     Mouth/Throat:     Mouth: Mucous membranes are moist.     Pharynx: Oropharynx is clear.   Eyes:     Extraocular Movements: Extraocular movements intact.     Conjunctiva/sclera: Conjunctivae normal.     Pupils: Pupils are equal, round, and reactive to light.   Neck:     Thyroid : No thyroid  mass, thyromegaly or thyroid  tenderness.   Cardiovascular:     Rate and Rhythm: Normal rate and regular rhythm.     Pulses: Normal pulses.     Heart sounds: Normal heart sounds.  Pulmonary:     Effort: Pulmonary effort is normal.     Breath sounds: Normal breath sounds.  Chest:     Comments:  Patient declined. Abdominal:     General: Bowel sounds are normal.     Palpations: Abdomen is soft.  Genitourinary:    Comments: Patient declined.  Musculoskeletal:        General: Normal range of motion.     Right shoulder: Normal.     Left shoulder: Normal.     Right upper arm: Normal.     Left upper arm: Normal.     Right elbow: Normal.     Left elbow: Normal.     Right forearm: Normal.     Left forearm: Normal.     Right wrist: Normal.     Left wrist: Normal.     Right hand: Normal.     Left hand: Normal.     Cervical back: Normal, normal range of motion and neck supple.     Thoracic back: Normal.     Lumbar back: Normal.     Right hip: Normal.     Left hip: Normal.     Right upper leg: Normal.     Left upper leg: Normal.     Right knee: Normal.     Left knee: Normal.     Right lower leg: Normal.     Left lower leg: Normal.     Right ankle: Normal.     Left ankle: Normal.     Right foot: Normal.     Left foot: Normal.   Skin:    General: Skin is warm and dry.     Capillary Refill: Capillary refill takes less than 2 seconds.   Neurological:     General: No focal deficit present.     Mental Status: She is alert and oriented to person, place, and time.   Psychiatric:        Mood and Affect: Mood normal.        Behavior: Behavior normal.      Assessment & Plan: 1. Welcome to Medicare preventive visit (Primary) - Counseled on 150 minutes of  exercise per week as tolerated, healthy eating (including decreased daily intake of saturated fats, cholesterol, added sugars, sodium), STI prevention, and routine healthcare maintenance.  2. Screening for metabolic disorder - Routine screening.  - CMP14+EGFR  3. Screening for deficiency anemia - Routine screening.  - CBC  4. Diabetes mellitus screening - Routine screening.  - Hemoglobin A1c  5. Screening cholesterol level - Routine screening.  - Lipid panel  6. Thyroid  disorder screen - Routine screening.  -  TSH  7. Osteoporosis screening - Routine screening.  - DG Bone Density; Future  8. Encounter to discuss test results - I discussed Ultrasound Abdomen Complete results with patient and recommended follow-up. Patient verbalized understanding and agreement.  During the course of the visit the patient was educated and counseled about appropriate screening and preventive services including:  The following orders were placed at today's visit; Orders Placed This Encounter  Procedures   DG Bone Density   CBC   Lipid panel   CMP14+EGFR   TSH   Hemoglobin A1c     An after visit summary with all of these plans was given to the patient.

## 2024-05-11 ENCOUNTER — Encounter: Payer: Self-pay | Admitting: Family

## 2024-05-11 LAB — CBC
Hematocrit: 46.5 % (ref 34.0–46.6)
Hemoglobin: 14.7 g/dL (ref 11.1–15.9)
MCH: 29.5 pg (ref 26.6–33.0)
MCHC: 31.6 g/dL (ref 31.5–35.7)
MCV: 93 fL (ref 79–97)
Platelets: 257 10*3/uL (ref 150–450)
RBC: 4.99 x10E6/uL (ref 3.77–5.28)
RDW: 13.1 % (ref 11.7–15.4)
WBC: 8.5 10*3/uL (ref 3.4–10.8)

## 2024-05-11 LAB — CMP14+EGFR
ALT: 15 IU/L (ref 0–32)
AST: 15 IU/L (ref 0–40)
Albumin: 4.4 g/dL (ref 3.9–4.9)
Alkaline Phosphatase: 83 IU/L (ref 44–121)
BUN/Creatinine Ratio: 16 (ref 12–28)
BUN: 14 mg/dL (ref 8–27)
Bilirubin Total: 0.4 mg/dL (ref 0.0–1.2)
CO2: 22 mmol/L (ref 20–29)
Calcium: 9.6 mg/dL (ref 8.7–10.3)
Chloride: 105 mmol/L (ref 96–106)
Creatinine, Ser: 0.9 mg/dL (ref 0.57–1.00)
Globulin, Total: 2.7 g/dL (ref 1.5–4.5)
Glucose: 94 mg/dL (ref 70–99)
Potassium: 4.5 mmol/L (ref 3.5–5.2)
Sodium: 142 mmol/L (ref 134–144)
Total Protein: 7.1 g/dL (ref 6.0–8.5)
eGFR: 71 mL/min/{1.73_m2} (ref >59)

## 2024-05-11 LAB — LIPID PANEL
Chol/HDL Ratio: 4.1 ratio (ref 0.0–4.4)
Cholesterol, Total: 212 mg/dL — ABNORMAL HIGH (ref 100–199)
HDL: 52 mg/dL (ref >39)
LDL Chol Calc (NIH): 138 mg/dL — ABNORMAL HIGH (ref 0–99)
Triglycerides: 121 mg/dL (ref 0–149)
VLDL Cholesterol Cal: 22 mg/dL (ref 5–40)

## 2024-05-11 LAB — HEMOGLOBIN A1C
Est. average glucose Bld gHb Est-mCnc: 128 mg/dL
Hgb A1c MFr Bld: 6.1 % — ABNORMAL HIGH (ref 4.8–5.6)

## 2024-05-11 LAB — TSH: TSH: 0.909 u[IU]/mL (ref 0.450–4.500)

## 2024-05-15 ENCOUNTER — Other Ambulatory Visit: Payer: Self-pay | Admitting: Family

## 2024-05-15 ENCOUNTER — Ambulatory Visit: Payer: Self-pay | Admitting: Family

## 2024-05-15 DIAGNOSIS — M79606 Pain in leg, unspecified: Secondary | ICD-10-CM

## 2024-05-15 DIAGNOSIS — E785 Hyperlipidemia, unspecified: Secondary | ICD-10-CM

## 2024-05-15 DIAGNOSIS — R718 Other abnormality of red blood cells: Secondary | ICD-10-CM

## 2024-05-15 MED ORDER — ATORVASTATIN CALCIUM 20 MG PO TABS
20.0000 mg | ORAL_TABLET | Freq: Every day | ORAL | 0 refills | Status: DC
Start: 2024-05-15 — End: 2024-08-14

## 2024-05-15 NOTE — Telephone Encounter (Signed)
 Refer to lab result note from 05/15/2024  8:06 AM. Schedule appointment for further discussion of issues/concerns.

## 2024-05-15 NOTE — Telephone Encounter (Signed)
 Patient notified

## 2024-05-17 ENCOUNTER — Ambulatory Visit (INDEPENDENT_AMBULATORY_CARE_PROVIDER_SITE_OTHER): Admitting: Family

## 2024-05-17 ENCOUNTER — Encounter: Payer: Self-pay | Admitting: Family

## 2024-05-17 VITALS — BP 120/78 | HR 77 | Temp 98.5°F | Resp 16 | Ht 66.0 in | Wt 229.6 lb

## 2024-05-17 DIAGNOSIS — R718 Other abnormality of red blood cells: Secondary | ICD-10-CM | POA: Diagnosis not present

## 2024-05-17 DIAGNOSIS — R109 Unspecified abdominal pain: Secondary | ICD-10-CM | POA: Diagnosis not present

## 2024-05-17 DIAGNOSIS — R202 Paresthesia of skin: Secondary | ICD-10-CM | POA: Diagnosis not present

## 2024-05-17 DIAGNOSIS — G8929 Other chronic pain: Secondary | ICD-10-CM | POA: Diagnosis not present

## 2024-05-17 DIAGNOSIS — R200 Anesthesia of skin: Secondary | ICD-10-CM | POA: Diagnosis not present

## 2024-05-17 NOTE — Progress Notes (Signed)
 When getting pedicure patient notice that her second toe was getting dark, both legs has numbness and tingling, Index finger keeps swelling up.  Still having pain on the left side it moves around to the back and other side

## 2024-05-17 NOTE — Progress Notes (Signed)
 Patient ID: PAMELLA SAMONS, female    DOB: Nov 12, 1956  MRN: [id number]  CC: Follow-Up  Subjective: Takila Kronberg is a 68 y.o. female who presents for follow-up.   Her concerns today include:  - States she would like to be referred to specialist for elevated red blood count on recent labs. Denies associated symptoms.  - States lower extremity numbness and tingling especially left foot pinky toe. Denies red flag symptoms. States she would like to be referred to specialist for evaluation.  - Stomach pain persisting. Denies red flag symptoms. States she has not received a call from Gastroenterology as of present.  - States right hand pointing finger swelling and stiffness in the mornings. Denies red flag symptoms. States she massages the finger and symptoms resolves. She declines pharmacological therapy and referral to specialist.   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 8 g 2   clotrimazole -betamethasone  (LOTRISONE ) cream Apply 1 Application topically daily. 45 g 2   prednisoLONE acetate (PRED FORTE) 1 % ophthalmic suspension Place 1 drop into the right eye in the morning and at bedtime.     atorvastatin (LIPITOR) 20 MG tablet Take 1 tablet (20 mg total) by mouth daily. (Patient not taking: Reported on 05/17/2024) 90 tablet 0   meclizine  (ANTIVERT ) 12.5 MG tablet Take 1 tablet (12.5 mg total) by mouth 3 (three) times daily as needed for dizziness. (Patient not taking: Reported on 05/17/2024) 30 tablet 1   No current facility-administered medications on file prior to visit.     No Known Allergies  Social History   Socioeconomic History   Marital status: Single    Spouse name: Not on file   Number of children: Not on file   Years of education: Not on file   Highest education level: Associate degree: academic program  Occupational History   Not on file  Tobacco Use   Smoking status: Former    Current packs/day: 0.00    Types: Cigarettes    Quit date: 07/15/2020    Years since quitting: 3.8    Passive exposure: Past   Smokeless tobacco: Never  Vaping Use   Vaping status: Never Used  Substance and Sexual Activity   Alcohol use: Yes    Comment: Occassionally.   Drug use: Never   Sexual activity: Not Currently  Other Topics Concern   Not on file  Social History Narrative   Not on file   Social Drivers of Health   Financial Resource Strain: Medium Risk (04/29/2024)   Overall Financial Resource Strain (CARDIA)    Difficulty of Paying Living Expenses: Somewhat hard  Food Insecurity: Food Insecurity Present (04/29/2024)   Hunger Vital Sign    Worried About Running Out of Food in the Last Year: Sometimes true    Ran Out of Food in the Last Year: Never true  Transportation Needs: No Transportation Needs (04/29/2024)   PRAPARE - Administrator, Civil Service (Medical): No    Lack of Transportation (Non-Medical): No  Physical Activity: Insufficiently Active (04/29/2024)   Exercise Vital Sign  Days of Exercise per Week: 3 days    Minutes of Exercise per Session: 30 min  Stress: No Stress Concern Present (04/29/2024)   [name] - Occupational Stress Questionnaire    Feeling of Stress : Only a little  Recent Concern: Stress - Stress Concern Present (04/09/2024)   [name] - Occupational Stress Questionnaire    Feeling of Stress : To some extent  Social Connections: Moderately Integrated (04/29/2024)   Social Connection and Isolation Panel    Frequency of Communication with Friends  and Family: More than three times a week    Frequency of Social Gatherings with Friends and Family: Once a week    Attends Religious Services: More than 4 times per year    Active Member of [employer] or Organizations: Yes    Attends Engineer, structural: More than 4 times per year    Marital Status: Divorced  Intimate Partner Violence: Not At Risk (02/09/2024)   Humiliation, Afraid, Rape, and Kick questionnaire    Fear of Current or Ex-Partner: No    Emotionally Abused: No    Physically Abused: No    Sexually Abused: No    Family History  Problem Relation Age of Onset   Breast cancer Mother    Diabetes Mother    Diabetes Father    Diabetes Maternal Grandmother    Stroke Maternal Grandmother    Colon polyps Maternal Grandmother    Colon cancer Maternal Grandfather    Diabetes Paternal Grandmother    Colon polyps Maternal Uncle    Esophageal cancer Neg Hx    Rectal cancer Neg Hx    Stomach cancer Neg Hx     Past Surgical History:  Procedure Laterality Date   ABDOMINAL HYSTERECTOMY     COLONOSCOPY     > 10 yrs ago- normal     ROS: Review of Systems Negative except as stated above  PHYSICAL EXAM: BP 120/78   Pulse 77   Temp 98.5 F (36.9 C) (Oral)   Resp 16   Ht 5' 6 (1.676 m)   Wt 229 lb 9.6 oz (104.1 kg)   SpO2 93%   BMI 37.06 kg/m   Physical Exam HENT:     Head: Normocephalic and atraumatic.     Nose: Nose normal.     Mouth/Throat:     Mouth: Mucous membranes are moist.     Pharynx: Oropharynx is clear.   Eyes:     Extraocular Movements: Extraocular movements intact.     Conjunctiva/sclera: Conjunctivae normal.     Pupils: Pupils are equal, round, and reactive to light.    Cardiovascular:     Rate and Rhythm: Normal rate and regular rhythm.     Pulses: Normal pulses.     Heart sounds: Normal heart sounds.  Pulmonary:     Effort: Pulmonary effort is normal.     Breath sounds: Normal breath sounds.  Abdominal:     Palpations: Abdomen is  soft.   Musculoskeletal:        General: Normal range of motion.     Right shoulder: Normal.     Left shoulder: Normal.     Right upper arm: Normal.     Left upper arm: Normal.     Right elbow: Normal.     Left elbow: Normal.     Right forearm: Normal.     Left forearm: Normal.     Right wrist: Normal.     Left  wrist: Normal.     Right hand: Normal.     Left hand: Normal.     Cervical back: Normal, normal range of motion and neck supple.     Thoracic back: Normal.     Lumbar back: Normal.     Right hip: Normal.     Left hip: Normal.     Right upper leg: Normal.     Left upper leg: Normal.     Right knee: Normal.     Left knee: Normal.     Right lower leg: Normal.     Left lower leg: Normal.     Right ankle: Normal.     Left ankle: Normal.     Right foot: Normal.     Left foot: Normal.   Neurological:     General: No focal deficit present.     Mental Status: She is alert and oriented to person, place, and time.   Psychiatric:        Mood and Affect: Mood normal.        Behavior: Behavior normal.     ASSESSMENT AND PLAN: 1. Elevated red blood cell count (Primary) - Patient referred to Hematology Oncology on 05/15/2024.  2. Numbness and tingling of both legs - Patient declined pharmacological therapy.  - Patient referred to Vascular Surgery on 05/15/2024.  3. Chronic abdominal pain - Referral to Gastroenterology for evaluation/management. - Ambulatory referral to Gastroenterology   Patient was given the opportunity to ask questions.  Patient verbalized understanding of the plan and was able to repeat key elements of the plan. Patient was given clear instructions to go to Emergency Department or return to medical center if symptoms don't improve, worsen, or new problems develop.The patient verbalized understanding.   Orders Placed This Encounter  Procedures   Ambulatory referral to Gastroenterology   Follow-up with primary provider as scheduled.  Greig JINNY Drones, NP

## 2024-05-18 ENCOUNTER — Emergency Department (HOSPITAL_COMMUNITY)
Admission: EM | Admit: 2024-05-18 | Discharge: 2024-05-18 | Disposition: A | Discharge status: Home / Self Care | Attending: Emergency Medicine | Admitting: Emergency Medicine

## 2024-05-18 ENCOUNTER — Other Ambulatory Visit: Payer: Self-pay

## 2024-05-18 ENCOUNTER — Encounter (HOSPITAL_COMMUNITY): Payer: Self-pay

## 2024-05-18 ENCOUNTER — Emergency Department (HOSPITAL_COMMUNITY)

## 2024-05-18 DIAGNOSIS — R739 Hyperglycemia, unspecified: Secondary | ICD-10-CM | POA: Diagnosis not present

## 2024-05-18 DIAGNOSIS — R1032 Left lower quadrant pain: Secondary | ICD-10-CM | POA: Diagnosis not present

## 2024-05-18 DIAGNOSIS — R1084 Generalized abdominal pain: Secondary | ICD-10-CM | POA: Insufficient documentation

## 2024-05-18 DIAGNOSIS — K5730 Diverticulosis of large intestine without perforation or abscess without bleeding: Secondary | ICD-10-CM | POA: Diagnosis not present

## 2024-05-18 LAB — URINALYSIS, ROUTINE W REFLEX MICROSCOPIC
Bilirubin Urine: NEGATIVE
Glucose, UA: NEGATIVE mg/dL
Hgb urine dipstick: NEGATIVE
Ketones, ur: NEGATIVE mg/dL
Leukocytes,Ua: NEGATIVE
Nitrite: NEGATIVE
Protein, ur: NEGATIVE mg/dL
Specific Gravity, Urine: 1.029 (ref 1.005–1.030)
pH: 6 (ref 5.0–8.0)

## 2024-05-18 LAB — COMPREHENSIVE METABOLIC PANEL WITH GFR
ALT: 18 U/L (ref 0–44)
AST: 24 U/L (ref 15–41)
Albumin: 3.8 g/dL (ref 3.5–5.0)
Alkaline Phosphatase: 61 U/L (ref 38–126)
Anion gap: 12 (ref 5–15)
BUN: 12 mg/dL (ref 8–23)
CO2: 24 mmol/L (ref 22–32)
Calcium: 9.4 mg/dL (ref 8.9–10.3)
Chloride: 104 mmol/L (ref 98–111)
Creatinine, Ser: 0.99 mg/dL (ref 0.44–1.00)
GFR, Estimated: >60 mL/min (ref >60)
Glucose, Bld: 151 mg/dL — ABNORMAL HIGH (ref 70–99)
Potassium: 4.1 mmol/L (ref 3.5–5.1)
Sodium: 140 mmol/L (ref 135–145)
Total Bilirubin: 0.7 mg/dL (ref 0.0–1.2)
Total Protein: 7.3 g/dL (ref 6.5–8.1)

## 2024-05-18 LAB — CBC
HCT: 45.8 % (ref 36.0–46.0)
Hemoglobin: 14.9 g/dL (ref 12.0–15.0)
MCH: 29 pg (ref 26.0–34.0)
MCHC: 32.5 g/dL (ref 30.0–36.0)
MCV: 89.3 fL (ref 80.0–100.0)
Platelets: 259 10*3/uL (ref 150–400)
RBC: 5.13 MIL/uL — ABNORMAL HIGH (ref 3.87–5.11)
RDW: 12.9 % (ref 11.5–15.5)
WBC: 6.7 10*3/uL (ref 4.0–10.5)
nRBC: 0 % (ref 0.0–0.2)

## 2024-05-18 LAB — LIPASE, BLOOD: Lipase: 32 U/L (ref 11–51)

## 2024-05-18 MED ORDER — METHOCARBAMOL 500 MG PO TABS
500.0000 mg | ORAL_TABLET | Freq: Once | ORAL | Status: AC
  Administered 2024-05-18: 500 mg via ORAL
  Filled 2024-05-18: qty 1

## 2024-05-18 MED ORDER — IOHEXOL 350 MG/ML SOLN
75.0000 mL | Freq: Once | INTRAVENOUS | Status: AC | PRN
  Administered 2024-05-18: 75 mL via INTRAVENOUS

## 2024-05-18 MED ORDER — METHOCARBAMOL 500 MG PO TABS: 500.0000 mg | ORAL_TABLET | Freq: Two times a day (BID) | ORAL | 0 refills | Status: DC

## 2024-05-18 MED ORDER — KETOROLAC TROMETHAMINE 15 MG/ML IJ SOLN
15.0000 mg | Freq: Once | INTRAMUSCULAR | Status: AC
  Administered 2024-05-18: 15 mg via INTRAMUSCULAR
  Filled 2024-05-18: qty 1

## 2024-05-18 NOTE — ED Triage Notes (Signed)
 Pt here for LLQ abd pain that radiates to lower back that started a couple of days ago. Denies n/v/d

## 2024-05-18 NOTE — Discharge Instructions (Signed)
 Please take Tylenol ibuprofen consistently for the next few days.  You are also prescribed Robaxin.  You may take 1 tablet 2 times a day for the next 10 days for pain.  Please follow-up with your regular doctor Thank you for allowing us  to take care of you today.  We hope you begin feeling better soon.   To-Do:  Please follow-up with your primary doctor within the next 2-3 days. Please return to the Emergency Department or call 911 if you experience chest pain, shortness of breath, severe pain, severe fever, altered mental status, or have any reason to think that you need emergency medical care.  Thank you again.  Hope you feel better soon.  Jol[facility] Pack Department of Emergency Medicine

## 2024-05-18 NOTE — ED Notes (Signed)
Food and beverage provided.

## 2024-05-18 NOTE — ED Provider Notes (Signed)
 [place] [facility] AT [facility] Provider Note   CSN: [id number] Arrival date & time: 05/18/24  1053     Patient presents with: Abdominal Pain   Molly Hanson is a 68 y.o. female with PMHx OA, HLD who presents to ED concerned for LLQ pain. Patient stating that she has been having LLQ pain intermittently over the past 1 year. Patient concerned because the pain has been more constant and severe x3 days. Patient stating that it hurts when she walks and moves her legs. Last dose of OTC pain medication was aleve around 48 hours ago. Patient also tried a muscle relaxer without much relief.   Denies fever, nausea, vomiting, diarrhea, dysuria, hematuria, hematochezia, vaginal discharge.     Abdominal Pain      Prior to Admission medications   Medication Sig Start Date End Date Taking? Authorizing Provider  albuterol  (VENTOLIN  HFA) 108 (90 Base) MCG/ACT inhaler Inhale 2 puffs into the lungs every 6 (six) hours as needed for wheezing or shortness of breath. 02/09/24   Caudle, Thersia Bitters, FNP  atorvastatin  (LIPITOR) 20 MG tablet Take 1 tablet (20 mg total) by mouth daily. Patient not taking: Reported on 05/17/2024 05/15/24   Lorren Greig PARAS, NP  clotrimazole -betamethasone  (LOTRISONE ) cream Apply 1 Application topically daily. 05/03/24   Lorren Greig PARAS, NP  meclizine  (ANTIVERT ) 12.5 MG tablet Take 1 tablet (12.5 mg total) by mouth 3 (three) times daily as needed for dizziness. Patient not taking: Reported on 05/17/2024 03/24/24   Lorren Greig PARAS, NP  prednisoLONE acetate (PRED FORTE) 1 % ophthalmic suspension Place 1 drop into the right eye in the morning and at bedtime.    [provider]    Allergies: Patient has no known allergies.    Review of Systems  Gastrointestinal:  Positive for abdominal pain.    Updated Vital Signs BP (!) 162/75 (BP Location: Right Arm)   Pulse 84   Temp 98.7 F (37.1 C)   Resp 18   Ht 5' 6 (1.676 m)   Wt 103.9 kg   SpO2  98%   BMI 36.96 kg/m   Physical Exam Vitals and nursing note reviewed.  Constitutional:      General: She is not in acute distress.    Appearance: She is not ill-appearing or toxic-appearing.  HENT:     Head: Normocephalic and atraumatic.     Mouth/Throat:     Mouth: Mucous membranes are moist.     Pharynx: No posterior oropharyngeal erythema.   Eyes:     General: No scleral icterus.       Right eye: No discharge.        Left eye: No discharge.     Conjunctiva/sclera: Conjunctivae normal.    Cardiovascular:     Rate and Rhythm: Normal rate and regular rhythm.     Pulses: Normal pulses.     Heart sounds: Normal heart sounds. No murmur heard. Pulmonary:     Effort: Pulmonary effort is normal. No respiratory distress.     Breath sounds: Normal breath sounds. No wheezing, rhonchi or rales.  Abdominal:     General: Abdomen is flat. Bowel sounds are normal. There is no distension.     Palpations: Abdomen is soft.     Tenderness: There is abdominal tenderness in the left lower quadrant.   Musculoskeletal:     Right lower leg: No edema.     Left lower leg: No edema.   Skin:    General: Skin  is warm and dry.     Findings: No rash.   Neurological:     General: No focal deficit present.     Mental Status: She is alert. Mental status is at baseline.   Psychiatric:        Mood and Affect: Mood normal.     (all labs ordered are listed, but only abnormal results are displayed) Labs Reviewed  COMPREHENSIVE METABOLIC PANEL WITH GFR - Abnormal; Notable for the following components:      Result Value   Glucose, Bld 151 (*)    All other components within normal limits  CBC - Abnormal; Notable for the following components:   RBC 5.13 (*)    All other components within normal limits  LIPASE, BLOOD  URINALYSIS, ROUTINE W REFLEX MICROSCOPIC    EKG: None  Radiology: No results found.   Procedures   Medications Ordered in the ED - No data to display  Clinical Course  as of 05/18/24 1506  Thu May 18, 2024  1504 [ ]  UA -likel MSK, dc w/ pain meds [TS]    Clinical Course User Index [TS] Sherrine Birmingham, DO                                 Medical Decision Making Amount and/or Complexity of Data Reviewed Labs: ordered. Radiology: ordered.  Risk Prescription drug management.    This patient presents to the ED for concern of abdominal pain, this involves an extensive number of treatment options, and is a complaint that carries with it a high risk of complications and morbidity.  The differential diagnosis includes gastroenteritis, colitis, small bowel obstruction, appendicitis, cholecystitis, pancreatitis, nephrolithiasis, UTI, pyleonephritis   Co morbidities that complicate the patient evaluation  OA, HLD   Additional history obtained:  11/2020 Colonoscopy:  - Scattered small-mouthed diverticula were found in the transverse colon and left colon.  - A 3 mm polyp was found in the ascending colon.  - A 4 mm polyp was found in the transverse colon.  - A 3 mm polyp was found in the sigmoid colon.  - Internal hemorrhoids were found during retroflexion. The hemorrhoids were small.  - The exam was otherwise without abnormality.    Problem List / ED Course / Critical interventions / Medication management  Patient presented for abdominal pain.  Physical exam with LLQ tenderness to palpation.  Rest of physical exam reassuring.  Patient afebrile with stable vitals.  Patient denying any recent infectious symptoms. I Ordered, and personally interpreted labs.  CBC without leukocytosis or anemia.  CMP with hyperglycemia at 151.  Lipase within normal limits.  UA pending. I ordered imaging studies including CT Abd/Pelvis with contrast: evaluate for structural/surgical etiology of patients' severe abdominal pain.  I independently visualized and interpreted imaging and I agree with the radiologist interpretation of no acute process. I have reviewed the patients  home medicines and have made adjustments as needed   Social Determinants of Health:  none  3:07 PM Care of Molly Hanson transferred to Dr. Sherrine at the end of my shift as the patient will require reassessment once labs/imaging have resulted. Patient presentation, ED course, and plan of care discussed with review of all pertinent labs and imaging. Please see his/her note for further details regarding further ED course and disposition. Plan at time of handoff is reassess patient after UA. This may be altered or completely changed at the discretion of  the oncoming team pending results of further workup.      Final diagnoses:  None    ED Discharge Orders     None          Hoy Nidia FALCON, [place] 05/18/24 1507    Freddi Hamilton, MD 05/19/24 [phone]

## 2024-05-18 NOTE — ED Provider Notes (Signed)
  Assume Care - Medical Decision Making  Care of patient assumed from previous emergency medicine provider. See their note for further details of history, physical exam and plan.  Briefly, Molly Hanson is a 68 y.o. female who presents with  PMHx OA, HLD who presents to ED concerned for LLQ pain. Patient stating that she has been having LLQ pain intermittently over the past 1 year. Suspect pain 2/2 MSK  Plan at time of Handoff: -f/u UA  I personally reassessed the patient: No acute distress, pain resolved  Vitals:   05/18/24 1059 05/18/24 1439  BP: (!) 162/75 135/76  Pulse: 84 (!) 58  Resp: 18 16  Temp: 98.7 F (37.1 C) 97.6 F (36.4 C)  SpO2: 98% 100%   Physical Exam  BP (!) 135/91   Pulse 87   Temp 98.1 F (36.7 C) (Oral)   Resp 16   SpO2 100%     Additional MDM/ED Course: Urinalysis without signs of infection.  No signs of emergency or life-threatening etiology.  Advised to continue Tylenol ibuprofen prescribe course of Robaxin.  Vies to follow-up PCP    Waddell Seats, DO PGY-3 Emergency Medicine    Seats Waddell, DO 05/18/24 1555    Francesca Elsie CROME, MD 05/18/24 [phone]

## 2024-05-18 NOTE — ED Notes (Signed)
 Pt just urinated, is not a candidate for In and out cath. Urine cup provided and this EMT instructed pt we need a urine sample as soon as possible

## 2024-05-19 ENCOUNTER — Other Ambulatory Visit: Payer: Self-pay | Admitting: Family

## 2024-05-19 DIAGNOSIS — R200 Anesthesia of skin: Secondary | ICD-10-CM

## 2024-05-19 NOTE — Telephone Encounter (Signed)
 Neurology order complete.

## 2024-05-24 ENCOUNTER — Other Ambulatory Visit: Payer: Self-pay | Admitting: *Deleted

## 2024-05-24 ENCOUNTER — Other Ambulatory Visit: Payer: Self-pay | Admitting: Family

## 2024-05-24 ENCOUNTER — Ambulatory Visit: Payer: Self-pay

## 2024-05-24 DIAGNOSIS — R109 Unspecified abdominal pain: Secondary | ICD-10-CM

## 2024-05-24 DIAGNOSIS — M79604 Pain in right leg: Secondary | ICD-10-CM

## 2024-05-24 MED ORDER — OMEPRAZOLE 20 MG PO CPDR: 20.0000 mg | DELAYED_RELEASE_CAPSULE | Freq: Every day | ORAL | 0 refills | Status: DC

## 2024-05-24 NOTE — Telephone Encounter (Signed)
 Report to Emergency Department/Urgent Care/call 911 for immediate medical evaluation. Tylenol #3 not recommended for abdominal pain.

## 2024-05-24 NOTE — Telephone Encounter (Signed)
 Copied from CRM [id number]. Topic: Clinical - Medication Question >> May 24, 2024  8:41 AM Avram MATSU wrote: Reason for CRM: patient has abdominal pain and would like to be prescribe pain medication.   Duplicate request.

## 2024-05-24 NOTE — Telephone Encounter (Signed)
 FYI Only or Action Required?: FYI only for provider.  Patient was last seen in primary care on 05/17/2024 by Lorren Greig PARAS, NP. Called Nurse Triage reporting Abdominal Pain. Symptoms began several weeks ago. Interventions attempted: OTC medications:  SABRA Symptoms are: gradually worsening. Pt. Reports she has abdominal pain a long time, seeing a specialist tomorrow. I need something for pain sent to my pharmacy. Triage Disposition: See Physician Within 24 Hours  Patient/caregiver understands and will follow disposition?: Yes       Copied from CRM [id number]. Topic: Clinical - Red Word Triage >> May 24, 2024  8:43 AM Avram MATSU wrote: Red Word that prompted transfer to Nurse Triage: abdominal pain Reason for Disposition  [1] MODERATE pain (e.g., interferes with normal activities) AND [2] pain comes and goes (cramps) AND [3] present > 24 hours  (Exception: Pain with Vomiting or Diarrhea - see that Guideline.)  Answer Assessment - Initial Assessment Questions 1. LOCATION: Where does it hurt?      All over 2. RADIATION: Does the pain shoot anywhere else? (e.g., chest, back)     no 3. ONSET: When did the pain begin? (e.g., minutes, hours or days ago)      For a long time 4. SUDDEN: Gradual or sudden onset?     Gradual 5. PATTERN Does the pain come and go, or is it constant?    - If it comes and goes: How long does it last? Do you have pain now?     (Note: Comes and goes means the pain is intermittent. It goes away completely between bouts.)    - If constant: Is it getting better, staying the same, or getting worse?      (Note: Constant means the pain never goes away completely; most serious pain is constant and gets worse.)      Comes and goes 6. SEVERITY: How bad is the pain?  (e.g., Scale 1-10; mild, moderate, or severe)    - MILD (1-3): Doesn't interfere with normal activities, abdomen soft and not tender to touch.     - MODERATE (4-7): Interferes with normal activities  or awakens from sleep, abdomen tender to touch.     - SEVERE (8-10): Excruciating pain, doubled over, unable to do any normal activities.       10 with movement 7. RECURRENT SYMPTOM: Have you ever had this type of stomach pain before? If Yes, ask: When was the last time? and What happened that time?      yes 8. CAUSE: What do you think is causing the stomach pain?     unsure 9. RELIEVING/AGGRAVATING FACTORS: What makes it better or worse? (e.g., antacids, bending or twisting motion, bowel movement)     no 10. OTHER SYMPTOMS: Do you have any other symptoms? (e.g., back pain, diarrhea, fever, urination pain, vomiting)       no 11. PREGNANCY: Is there any chance you are pregnant? When was your last menstrual period?       no  Protocols used: Abdominal Pain - [facility]

## 2024-05-24 NOTE — Telephone Encounter (Signed)
 Pt calling back. She states that she doesn't want something for GERD she is wanting something for pain. Ex she used was Tylenol #3. Advised I would send message to provider and have nurse FU with her.

## 2024-05-24 NOTE — Telephone Encounter (Signed)
 Complete

## 2024-05-25 ENCOUNTER — Telehealth: Payer: Self-pay | Admitting: Family

## 2024-05-25 ENCOUNTER — Ambulatory Visit (HOSPITAL_COMMUNITY)
Admission: RE | Admit: 2024-05-25 | Discharge: 2024-05-25 | Disposition: A | Discharge status: Home / Self Care | Source: Ambulatory Visit | Attending: Vascular Surgery | Admitting: Vascular Surgery

## 2024-05-25 DIAGNOSIS — M79605 Pain in left leg: Secondary | ICD-10-CM | POA: Diagnosis not present

## 2024-05-25 DIAGNOSIS — M79604 Pain in right leg: Secondary | ICD-10-CM | POA: Diagnosis not present

## 2024-05-25 LAB — VAS US ABI WITH/WO TBI
Left ABI: 1.03
Right ABI: 1

## 2024-05-25 NOTE — Telephone Encounter (Signed)
 Pt is requesting a MRI for her lower back area for concerns of sciatica/nerve pain. Pt also mentioned she needs medication for pain, like a stronger tylenol. Please advise.

## 2024-05-29 NOTE — Telephone Encounter (Addendum)
 Attempted to call patient to follow up for request.  No answer, left message on voicemail to return call.    May need an OV. Abdominal pain addressed at previous OV. Did not see note regarding back pain.

## 2024-05-29 NOTE — Telephone Encounter (Signed)
 Copied from CRM [id number]. Topic: Clinical - Medication Question >> May 24, 2024  8:41 AM Avram MATSU wrote:  Reason for CRM: patient has abdominal pain and would like to be prescribe pain medication.  >> May 29, 2024 12:14 PM Emylou G wrote: Pls call patient back - concerns about her pain.. she returned our call

## 2024-05-29 NOTE — Telephone Encounter (Signed)
 Pt has appt on the next business day

## 2024-05-29 NOTE — Telephone Encounter (Addendum)
 Advised patient she would need an appointment with PCP. Has addressed abdominal pain in previous OV per notes. She states she has had a CT scan in the past and states her abdomen was noted to be normal. Feels like pain radiated all over to back and would like to have MRI. Advised again she would have to take levels to the MRI request. Insurance does not usually start with MRIs.    Scheduled appt with PCP for 05/30/2024 to discuss back pain, referral and request for MRI.

## 2024-05-29 NOTE — Telephone Encounter (Signed)
 Attempt to call patient.  Left message on voicemail to return call.   Inquire about GI referral / appointment

## 2024-05-29 NOTE — Telephone Encounter (Signed)
 Keep appointment scheduled for tomorrow 05/30/2024.

## 2024-05-30 ENCOUNTER — Ambulatory Visit (INDEPENDENT_AMBULATORY_CARE_PROVIDER_SITE_OTHER): Admitting: Family

## 2024-05-30 ENCOUNTER — Encounter: Payer: Self-pay | Admitting: Family

## 2024-05-30 VITALS — BP 124/89 | HR 74 | Temp 97.6°F | Resp 16 | Ht 66.0 in | Wt 227.2 lb

## 2024-05-30 DIAGNOSIS — M545 Low back pain, unspecified: Secondary | ICD-10-CM | POA: Diagnosis not present

## 2024-05-30 MED ORDER — GABAPENTIN 300 MG PO CAPS: 300.0000 mg | ORAL_CAPSULE | Freq: Every day | ORAL | 0 refills | Status: DC | PRN

## 2024-05-30 NOTE — Telephone Encounter (Signed)
 Noted patient has an appointment today

## 2024-05-30 NOTE — Progress Notes (Signed)
 Patient wants  a referral for neurologist because of back pain that's going down her leg as well.  Pain is in her abdomen as well.  PAD runs influenza er family

## 2024-05-30 NOTE — Progress Notes (Signed)
 Patient ID: Molly Hanson, female    DOB: 25-Jul-1956  MRN: [id number]  CC: Back Pain  Subjective: Molly Hanson is a 68 y.o. female who presents for back pain.   Her concerns today include:  Lower back pain. Denies recent trauma/injury and red flag symptoms. States she would like referral to Neurology. States she plans to wait to establish with Neurology for MRI of back. Patient declined xray of back on today.   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 8 g 2   clotrimazole -betamethasone  (LOTRISONE ) cream Apply 1 Application topically daily. 45 g 2   methocarbamol  (ROBAXIN ) 500 MG tablet Take 1 tablet (500 mg total) by mouth 2 (two) times daily. 20 tablet 0   omeprazole  (PRILOSEC) 20 MG capsule Take 1 capsule (20 mg total) by mouth daily. 90 capsule 0   prednisoLONE acetate (PRED FORTE) 1 % ophthalmic suspension Place 1 drop into the right eye in the morning and at bedtime.     atorvastatin  (LIPITOR) 20 MG tablet Take 1 tablet (20 mg total) by mouth daily. (Patient not taking: Reported on 05/17/2024) 90 tablet 0   meclizine  (ANTIVERT ) 12.5 MG tablet Take 1 tablet (12.5 mg total) by mouth 3 (three) times daily as needed for dizziness. (Patient not taking: Reported on 05/17/2024) 30 tablet 1   No current facility-administered medications on file prior to visit.    No Known Allergies  Social History   Socioeconomic History   Marital status: Single    Spouse name: Not on file   Number of children: Not on file   Years of education: Not on file   Highest education level:  Associate degree: academic program  Occupational History   Not on file  Tobacco Use   Smoking status: Former    Current packs/day: 0.00    Types: Cigarettes    Quit date: 07/15/2020    Years since quitting: 3.8    Passive exposure: Past   Smokeless tobacco: Never  Vaping Use   Vaping status: Never Used  Substance and Sexual Activity   Alcohol use: Yes    Comment: Occassionally.   Drug use: Never   Sexual activity: Not Currently  Other Topics Concern   Not on file  Social History Narrative   Not on file   Social Drivers of Health   Financial Resource Strain: Medium Risk (04/29/2024)   Overall Financial Resource Strain (CARDIA)    Difficulty of Paying Living Expenses: Somewhat hard  Food Insecurity: Food Insecurity Present (04/29/2024)   Hunger Vital Sign    Worried About Running Out of Food in the Last Year: Sometimes true    Ran Out of Food in the Last Year: Never true  Transportation Needs: No Transportation Needs (04/29/2024)   PRAPARE - Administrator, Civil Service (Medical): No    Lack of Transportation (Non-Medical): No  Physical Activity: Insufficiently Active (04/29/2024)   Exercise Vital Sign    Days of Exercise per Week: 3 days    Minutes of Exercise per Session: 30 min  Stress: No  Stress Concern Present (04/29/2024)   [name] - Occupational Stress Questionnaire    Feeling of Stress : Only a little  Recent Concern: Stress - Stress Concern Present (04/09/2024)   [name] - Occupational Stress Questionnaire    Feeling of Stress : To some extent  Social Connections: Moderately Integrated (04/29/2024)   Social Connection and Isolation Panel    Frequency of Communication with Friends and Family: More than three times a week    Frequency of Social Gatherings with Friends and Family: Once a week    Attends Religious Services: More than 4 times per year    Active Member of [employer] or Organizations: Yes     Attends Engineer, structural: More than 4 times per year    Marital Status: Divorced  Intimate Partner Violence: Not At Risk (02/09/2024)   Humiliation, Afraid, Rape, and Kick questionnaire    Fear of Current or Ex-Partner: No    Emotionally Abused: No    Physically Abused: No    Sexually Abused: No    Family History  Problem Relation Age of Onset   Breast cancer Mother    Diabetes Mother    Diabetes Father    Diabetes Maternal Grandmother    Stroke Maternal Grandmother    Colon polyps Maternal Grandmother    Colon cancer Maternal Grandfather    Diabetes Paternal Grandmother    Colon polyps Maternal Uncle    Esophageal cancer Neg Hx    Rectal cancer Neg Hx    Stomach cancer Neg Hx     Past Surgical History:  Procedure Laterality Date   ABDOMINAL HYSTERECTOMY     COLONOSCOPY     > 10 yrs ago- normal     ROS: Review of Systems Negative except as stated above  PHYSICAL EXAM: BP 124/89   Pulse 74   Temp 97.6 F (36.4 C) (Oral)   Resp 16   Ht 5' 6 (1.676 m)   Wt 227 lb 3.2 oz (103.1 kg)   SpO2 95%   BMI 36.67 kg/m   Physical Exam HENT:     Head: Normocephalic and atraumatic.     Nose: Nose normal.     Mouth/Throat:     Mouth: Mucous membranes are moist.     Pharynx: Oropharynx is clear.  Eyes:     Extraocular Movements: Extraocular movements intact.     Conjunctiva/sclera: Conjunctivae normal.     Pupils: Pupils are equal, round, and reactive to light.  Cardiovascular:     Rate and Rhythm: Normal rate and regular rhythm.     Pulses: Normal pulses.     Heart sounds: Normal heart sounds.  Pulmonary:     Effort: Pulmonary effort is normal.     Breath sounds: Normal breath sounds.  Musculoskeletal:        General: Normal range of motion.     Cervical back: Normal, normal range of motion and neck supple.     Thoracic back: Normal.     Lumbar back: Tenderness present.  Neurological:     General: No focal deficit present.     Mental Status: She  is alert and oriented to person, place, and time.  Psychiatric:        Mood and Affect: Mood normal.        Behavior: Behavior normal.     ASSESSMENT AND PLAN: 1. Left low back pain, unspecified chronicity, unspecified whether sciatica present (Primary) - Gabapentin  as prescribed. Counseled  on medication adherence/adverse effects.  - Referral to Neurology for evaluation/management.  - Follow-up with primary provider as scheduled. - Ambulatory referral to Neurology - gabapentin  (NEURONTIN ) 300 MG capsule; Take 1 capsule (300 mg total) by mouth daily as needed.  Dispense: 90 capsule; Refill: 0    Patient was given the opportunity to ask questions.  Patient verbalized understanding of the plan and was able to repeat key elements of the plan. Patient was given clear instructions to go to Emergency Department or return to medical center if symptoms don't improve, worsen, or new problems develop.The patient verbalized understanding.   Orders Placed This Encounter  Procedures   Ambulatory referral to Neurology     Requested Prescriptions   Signed Prescriptions Disp Refills   gabapentin  (NEURONTIN ) 300 MG capsule 90 capsule 0    Sig: Take 1 capsule (300 mg total) by mouth daily as needed.    Follow-up with primary provider as scheduled.  Greig JINNY Drones, NP

## 2024-06-02 ENCOUNTER — Encounter: Payer: Self-pay | Admitting: Neurology

## 2024-06-08 ENCOUNTER — Ambulatory Visit

## 2024-06-22 NOTE — Progress Notes (Signed)
  [facility] [phone]   [phone]  CONSULT NOTE  REFERRING PHYSICIAN: Amy Lorren  REASON FOR CONSULTATION:  Erythrocytosis   HPI Molly Hanson is a 68 y.o. female with a medical history significant for arthritis, pre-diabetes, and hypercholesterolemia is referred to the clinic for erythrocytosis.   The patient saw her PCP in June for a Medicare exam. Her CBC was within normal limits (WNL). Her WBC was normal at 8.5, RBC was 4.99, hemoglobin 14.7, and platelet count 257. The patient requested a specialist referral and was therefore referred to hematology.  She also requested to see neurology for back pain and had been referred to cardiology due to concerns related to tingling in her legs, discoloration, and circulation issues given a family history of PAD. She recently had ABIs performed, which were within normal limits. She is expected to see neurology soon for her back pain.  The oldest labs available are from 2021. There are a few occasions with normal hemoglobin and minimally elevated RBC.Her CBC has been normal except for the slight elevations noted above. For example, on 08/31/23, her RBC and HCT was slightly elevated above the reference range of 5.14 and 47, respectively. On 05/18/24, her RBC was slightly elevated at 5.13, but her hemoglobin and hematocrit were normal.  On one occasion 17 years ago, her hemoglobin was elevated at 17.3.   Overall, the patient states that her energy is okay. She has no known history of sleep apnea, although she reports a history of snoring and waking herself up in the middle of the night due to snoring. She denies dehydration and drinks five 16.9-ounce bottles of water per day. She is not on any Lasix. She denies recent nausea, vomiting, diarrhea, or constipation.  She denies excessive vitamin intake, unusual headaches, and any personal history of heart disease. She is a former smoker but denies any known  diagnosis of COPD or asthma. She is not currently on any hormones or steroids. She denies itching after hot showers.  She denies unexplained weight loss, unusual hematuria, or abdominal pain. She does have some back pain for which she is seeing neurology.  She is up to date on her health screenings and is due for her next colonoscopy in two years. The patient is a former smoker and is interested in enrolling in the lung cancer screening program.  Her family history includes a mother with hypertension, a maternal grandmother with PAD, a paternal grandfather with diabetes, and a father with diabetes.  The patient previously worked in Clinical biochemist at [employer]. She is single and has one child. She smoked for approximately 54 years, averaging half a pack of cigarettes per day. She drinks wine socially, approximately once every six months.   HPI  Past Medical History:  Diagnosis Date   Arthritis    knees, arm    Hyperlipidemia    has been elevated in the past - no meds    Vitamin D  deficiency     Past Surgical History:  Procedure Laterality Date   ABDOMINAL HYSTERECTOMY     COLONOSCOPY     > 10 yrs ago- normal     Family History  Problem Relation Age of Onset   Breast cancer Mother    Diabetes Mother    Diabetes Father    Diabetes Maternal Grandmother    Stroke Maternal Grandmother    Colon polyps Maternal Grandmother    Colon cancer Maternal Grandfather    Diabetes Paternal Grandmother  Colon polyps Maternal Uncle    Esophageal cancer Neg Hx    Rectal cancer Neg Hx    Stomach cancer Neg Hx     Social History Social History   Tobacco Use   Smoking status: Former    Current packs/day: 0.00    Types: Cigarettes    Quit date: 07/15/2020    Years since quitting: 3.9    Passive exposure: Past   Smokeless tobacco: Never  Vaping Use   Vaping status: Never Used  Substance Use Topics   Alcohol use: Yes    Comment: Occassionally.   Drug use: Never    No  Known Allergies  Current Outpatient Medications  Medication Sig Dispense Refill   albuterol  (VENTOLIN  HFA) 108 (90 Base) MCG/ACT inhaler Inhale 2 puffs into the lungs every 6 (six) hours as needed for wheezing or shortness of breath. 8 g 2   atorvastatin  (LIPITOR) 20 MG tablet Take 1 tablet (20 mg total) by mouth daily. (Patient not taking: Reported on 05/17/2024) 90 tablet 0   clotrimazole -betamethasone  (LOTRISONE ) cream Apply 1 Application topically daily. 45 g 2   gabapentin  (NEURONTIN ) 300 MG capsule Take 1 capsule (300 mg total) by mouth daily as needed. 90 capsule 0   meclizine  (ANTIVERT ) 12.5 MG tablet Take 1 tablet (12.5 mg total) by mouth 3 (three) times daily as needed for dizziness. (Patient not taking: Reported on 05/17/2024) 30 tablet 1   methocarbamol  (ROBAXIN ) 500 MG tablet Take 1 tablet (500 mg total) by mouth 2 (two) times daily. 20 tablet 0   omeprazole  (PRILOSEC) 20 MG capsule Take 1 capsule (20 mg total) by mouth daily. 90 capsule 0   prednisoLONE acetate (PRED FORTE) 1 % ophthalmic suspension Place 1 drop into the right eye in the morning and at bedtime.     No current facility-administered medications for this visit.    REVIEW OF SYSTEMS:   Review of Systems  Constitutional: Negative for appetite change, chills, fatigue, fever and unexpected weight change.  HENT: Negative for mouth sores, nosebleeds, sore throat and trouble swallowing.   Eyes: Negative for eye problems and icterus.  Respiratory: Negative for cough, hemoptysis, shortness of breath and wheezing.   Cardiovascular: Negative for chest pain and leg swelling.  Gastrointestinal: Negative for abdominal pain, constipation, diarrhea, nausea and vomiting.  Genitourinary: Negative for bladder incontinence, difficulty urinating, dysuria, frequency and hematuria.   Musculoskeletal: Positive for back pain. Negative for gait problem, neck pain and neck stiffness.  Skin: Negative for itching and rash.  Neurological:  Negative for dizziness, extremity weakness, gait problem, headaches, light-headedness and seizures.  Hematological: Negative for adenopathy. Does not bruise/bleed easily.  Psychiatric/Behavioral: Negative for confusion, depression and sleep disturbance. The patient is not nervous/anxious.     PHYSICAL EXAMINATION:  There were no vitals taken for this visit.  ECOG PERFORMANCE STATUS: 0  Physical Exam  Constitutional: Oriented to person, place, and time and well-developed, well-nourished, and in no distress.  HENT:  Head: Normocephalic and atraumatic.  Mouth/Throat: Oropharynx is clear and moist. No oropharyngeal exudate.  Eyes: Conjunctivae are normal. Right eye exhibits no discharge. Left eye exhibits no discharge. No scleral icterus.  Neck: Normal range of motion. Neck supple.  Cardiovascular: Normal rate, regular rhythm, normal heart sounds and intact distal pulses.   Pulmonary/Chest: Effort normal and breath sounds normal. No respiratory distress. No wheezes. No rales.  Abdominal: Soft. Bowel sounds are normal. Exhibits no distension and no mass. There is no tenderness.  Musculoskeletal: Normal range of motion.  Exhibits no edema.  Lymphadenopathy:    No cervical adenopathy.  Neurological: Alert and oriented to person, place, and time. Exhibits normal muscle tone. Gait normal. Coordination normal.  Skin: Skin is warm and dry. No rash noted. Not diaphoretic. No erythema. No pallor.  Psychiatric: Mood, memory and judgment normal.  Vitals reviewed.  LABORATORY DATA: Lab Results  Component Value Date   WBC 6.7 05/18/2024   HGB 14.9 05/18/2024   HCT 45.8 05/18/2024   MCV 89.3 05/18/2024   PLT 259 05/18/2024      Chemistry      Component Value Date/Time   NA 140 05/18/2024 1116   NA 142 05/10/2024 1429   K 4.1 05/18/2024 1116   CL 104 05/18/2024 1116   CO2 24 05/18/2024 1116   BUN 12 05/18/2024 1116   BUN 14 05/10/2024 1429   CREATININE 0.99 05/18/2024 1116       Component Value Date/Time   CALCIUM  9.4 05/18/2024 1116   ALKPHOS 61 05/18/2024 1116   AST 24 05/18/2024 1116   ALT 18 05/18/2024 1116   BILITOT 0.7 05/18/2024 1116   BILITOT 0.4 05/10/2024 1429       RADIOGRAPHIC STUDIES: VAS US  ABI WITH/WO TBI Result Date: 05/25/2024  LOWER EXTREMITY DOPPLER STUDY Patient Name:  KHAYA THEISSEN  Date of Exam:   05/25/2024 Medical Rec #: [id number]        Accession #:    [id number] Date of Birth: 11/09/1956        Patient Gender: F Patient Age:   75 years Exam Location:  [address] Procedure:      VAS US  ABI WITH/WO TBI Referring Phys: Elna Loud --------------------------------------------------------------------------------  Indications: Patient reports pain throughout both pelvic sides that radiates to              the lower back and down into both legs when walking, lying down or              with certain leg positions. She also reports intermittent numbness              in both feet. High Risk Factors: Past history of smoking.  Comparison Study: In 05/2023, a lower arterial Doppler showed an ABI of 1.08 on                   the right and 1.12 on the left. Performing Technologist: Shermon Muskrat RVT  Examination Guidelines: A complete evaluation includes at minimum, Doppler waveform signals and systolic blood pressure reading at the level of bilateral brachial, anterior tibial, and posterior tibial arteries, when vessel segments are accessible. Bilateral testing is considered an integral part of a complete examination. Photoelectric Plethysmograph (PPG) waveforms and toe systolic pressure readings are included as required and additional duplex testing as needed. Limited examinations for reoccurring indications may be performed as noted.  ABI Findings: +---------+------------------+-----+-----------+--------+ Right    Rt Pressure (mmHg)IndexWaveform   Comment  +---------+------------------+-----+-----------+--------+ Brachial 150                                         +---------+------------------+-----+-----------+--------+ PTA      150               1.00 multiphasic         +---------+------------------+-----+-----------+--------+ DP       144  0.96 triphasic           +---------+------------------+-----+-----------+--------+ Great Toe175               1.17 Normal              +---------+------------------+-----+-----------+--------+ +---------+------------------+-----+-----------+-------+ Left     Lt Pressure (mmHg)IndexWaveform   Comment +---------+------------------+-----+-----------+-------+ Brachial 140                                       +---------+------------------+-----+-----------+-------+ PTA      155               1.03 multiphasic        +---------+------------------+-----+-----------+-------+ DP       158               1.05 multiphasic        +---------+------------------+-----+-----------+-------+ Great Toe169               1.13 Normal             +---------+------------------+-----+-----------+-------+ +-------+-----------+-----------+------------+------------+ ABI/TBIToday's ABIToday's TBIPrevious ABIPrevious TBI +-------+-----------+-----------+------------+------------+ Right  1.00       1.17       1.08        1.02         +-------+-----------+-----------+------------+------------+ Left   1.03       1.13       1.12        .91          +-------+-----------+-----------+------------+------------+  Bilateral ABIs and TBIs appear essentially unchanged compared to prior study on 06/22/2023.  Summary: Right: Resting right ankle-brachial index is within normal range. The right toe-brachial index is normal. Left: Resting left ankle-brachial index is within normal range. The left toe-brachial index is normal. *See table(s) above for measurements and observations.  Electronically signed by Fonda Rim on 05/25/2024 at 4:00:55 PM.    Final     ASSESSMENT: This is a  very pleasant 68 year old female referred to the clinic for concerns related to erythrocytosis.      # Erythrocytosis -The patient was seen with Dr. Federico -Laboratory results have generally been within normal limits, with only occasional, mild elevations in RBC above the reference range. -Given her current lab findings, extensive testing (e.g., JAK2, BCR-ABL) is not indicated at this time. This can be reconsidered if erythropoietin  (EPO) levels return low. -We will arrange for CBC, CMP, ferritin, iron studies, and EPO testing.  -We will refer to neurology for testing for sleep apnea which can contribute to erythrocytosis. She reports snoring.  -Will add on B12 testing per patient request -I will call the patient with results. If all studies return within normal limits, no hematology follow-up is needed, and she may continue care with her PCP.  #History of Smoking -She is a former smoker having smoked 0.5 ppd for ~54 years. Quit in ~2021.  -Meets screening guide lines for lung cancer screening program. She is interested.  -I will place the referral   The patient voices understanding of current disease status and treatment options and is in agreement with the current care plan.  All questions were answered. The patient knows to call the clinic with any problems, questions or concerns. We can certainly see the patient much sooner if necessary.  Thank you so much for allowing me to participate in the care of Molly Hanson. I will continue to follow up the  patient with you and assist in her care.  Disclaimer: This note was dictated with voice recognition software. Similar sounding words can inadvertently be transcribed and may not be corrected upon review.   Tilmon Wisehart L Neya Creegan June 22, 2024, 9:50 AM  I have read the above note and personally examined the patient. I agree with the assessment and plan as noted above.  Briefly Mrs. Gul is a 68 year old female who presents for  evaluation of erythrocytosis.  At this time would recommend full evaluation to include repeat CBC, CMP, EPO levels, and MPN workup with JAK2 with reflex as well as BCR/ABL.  Additionally will collect iron levels.  The patient does have issues with snoring and this may be secondary to obstructive sleep apnea.  Will plan to have the patient return to clinic pending the results of the above study.  The patient voiced understanding of our findings and plan moving forward.   Norleen IVAR Kidney, MD Department of Hematology/Oncology [facility] Phone: [phone] Pager: [phone] Email: [email or web address]

## 2024-06-23 ENCOUNTER — Inpatient Hospital Stay

## 2024-06-23 ENCOUNTER — Other Ambulatory Visit: Payer: Self-pay | Admitting: Physician Assistant

## 2024-06-23 ENCOUNTER — Inpatient Hospital Stay: Attending: Physician Assistant | Admitting: Physician Assistant

## 2024-06-23 VITALS — BP 124/67 | HR 72 | Temp 97.2°F | Resp 20

## 2024-06-23 DIAGNOSIS — D751 Secondary polycythemia: Secondary | ICD-10-CM | POA: Diagnosis not present

## 2024-06-23 DIAGNOSIS — R0683 Snoring: Secondary | ICD-10-CM

## 2024-06-23 DIAGNOSIS — R109 Unspecified abdominal pain: Secondary | ICD-10-CM | POA: Insufficient documentation

## 2024-06-23 DIAGNOSIS — M7989 Other specified soft tissue disorders: Secondary | ICD-10-CM | POA: Insufficient documentation

## 2024-06-23 DIAGNOSIS — G4730 Sleep apnea, unspecified: Secondary | ICD-10-CM | POA: Diagnosis not present

## 2024-06-23 DIAGNOSIS — M549 Dorsalgia, unspecified: Secondary | ICD-10-CM | POA: Diagnosis not present

## 2024-06-23 DIAGNOSIS — E7800 Pure hypercholesterolemia, unspecified: Secondary | ICD-10-CM | POA: Diagnosis not present

## 2024-06-23 DIAGNOSIS — Z800 Family history of malignant neoplasm of digestive organs: Secondary | ICD-10-CM | POA: Insufficient documentation

## 2024-06-23 DIAGNOSIS — Z87891 Personal history of nicotine dependence: Secondary | ICD-10-CM | POA: Diagnosis not present

## 2024-06-23 DIAGNOSIS — R7989 Other specified abnormal findings of blood chemistry: Secondary | ICD-10-CM | POA: Diagnosis not present

## 2024-06-23 DIAGNOSIS — Z803 Family history of malignant neoplasm of breast: Secondary | ICD-10-CM | POA: Insufficient documentation

## 2024-06-23 DIAGNOSIS — M129 Arthropathy, unspecified: Secondary | ICD-10-CM | POA: Diagnosis not present

## 2024-06-23 DIAGNOSIS — R718 Other abnormality of red blood cells: Secondary | ICD-10-CM | POA: Diagnosis not present

## 2024-06-23 DIAGNOSIS — Z79899 Other long term (current) drug therapy: Secondary | ICD-10-CM | POA: Diagnosis not present

## 2024-06-23 LAB — CMP (CANCER CENTER ONLY)
ALT: 17 U/L (ref 0–44)
AST: 15 U/L (ref 15–41)
Albumin: 4.3 g/dL (ref 3.5–5.0)
Alkaline Phosphatase: 67 U/L (ref 38–126)
Anion gap: 5 (ref 5–15)
BUN: 13 mg/dL (ref 8–23)
CO2: 30 mmol/L (ref 22–32)
Calcium: 9.6 mg/dL (ref 8.9–10.3)
Chloride: 106 mmol/L (ref 98–111)
Creatinine: 0.91 mg/dL (ref 0.44–1.00)
GFR, Estimated: >60 mL/min (ref >60)
Glucose, Bld: 105 mg/dL — ABNORMAL HIGH (ref 70–99)
Potassium: 4 mmol/L (ref 3.5–5.1)
Sodium: 141 mmol/L (ref 135–145)
Total Bilirubin: 0.6 mg/dL (ref 0.0–1.2)
Total Protein: 7.5 g/dL (ref 6.5–8.1)

## 2024-06-23 LAB — CBC WITH DIFFERENTIAL (CANCER CENTER ONLY)
Abs Immature Granulocytes: 0.05 K/uL (ref 0.00–0.07)
Basophils Absolute: 0 K/uL (ref 0.0–0.1)
Basophils Relative: 0 %
Eosinophils Absolute: 0.1 K/uL (ref 0.0–0.5)
Eosinophils Relative: 1 %
HCT: 44.6 % (ref 36.0–46.0)
Hemoglobin: 14.6 g/dL (ref 12.0–15.0)
Immature Granulocytes: 1 %
Lymphocytes Relative: 20 %
Lymphs Abs: 1.9 K/uL (ref 0.7–4.0)
MCH: 28.6 pg (ref 26.0–34.0)
MCHC: 32.7 g/dL (ref 30.0–36.0)
MCV: 87.5 fL (ref 80.0–100.0)
Monocytes Absolute: 0.5 K/uL (ref 0.1–1.0)
Monocytes Relative: 6 %
Neutro Abs: 6.6 K/uL (ref 1.7–7.7)
Neutrophils Relative %: 72 %
Platelet Count: 238 K/uL (ref 150–400)
RBC: 5.1 MIL/uL (ref 3.87–5.11)
RDW: 12.9 % (ref 11.5–15.5)
WBC Count: 9.2 K/uL (ref 4.0–10.5)
nRBC: 0 % (ref 0.0–0.2)

## 2024-06-23 LAB — VITAMIN B12: Vitamin B-12: 364 pg/mL (ref 180–914)

## 2024-06-23 LAB — IRON AND IRON BINDING CAPACITY (CC-WL,HP ONLY)
Iron: 117 ug/dL (ref 28–170)
Saturation Ratios: 34 % — ABNORMAL HIGH (ref 10.4–31.8)
TIBC: 343 ug/dL (ref 250–450)
UIBC: 226 ug/dL (ref 148–442)

## 2024-06-23 LAB — FERRITIN: Ferritin: 585 ng/mL — ABNORMAL HIGH (ref 11–307)

## 2024-06-23 NOTE — Progress Notes (Signed)
 Medication reconciliation- Pt stated she has the oral medications that are listed in EPIC at home , however she stated she is not taking them per her preference.

## 2024-06-24 LAB — ERYTHROPOIETIN: Erythropoietin: 10 m[IU]/mL (ref 2.6–18.5)

## 2024-06-28 ENCOUNTER — Other Ambulatory Visit: Payer: Self-pay | Admitting: Physician Assistant

## 2024-06-28 ENCOUNTER — Telehealth: Payer: Self-pay | Admitting: Medical Oncology

## 2024-06-28 ENCOUNTER — Telehealth: Payer: Self-pay | Admitting: Physician Assistant

## 2024-06-28 ENCOUNTER — Encounter: Payer: Self-pay | Admitting: Physician Assistant

## 2024-06-28 DIAGNOSIS — D751 Secondary polycythemia: Secondary | ICD-10-CM

## 2024-06-28 NOTE — Telephone Encounter (Signed)
 Attempted to contact the patient to review her lab results from last week but was unable to reach her. Left a voicemail with our callback number and will also send a message via MyChart.  The purpose of the call was to inform her that her ferritin level is elevated, which may be related to inflammation, particularly given her recent back issues. Her iron studies do not indicate iron overload.  After discussion with Dr. Federico, we plan to follow up with repeat labs in one month, including ferritin, ESR, and CRP. I will send scheduling message

## 2024-06-28 NOTE — Telephone Encounter (Signed)
 Asking for lab results from her recent visit. I told Sakira that Charlott is going to review her labs with Dr Federico and call her back.

## 2024-06-29 ENCOUNTER — Telehealth: Payer: Self-pay | Admitting: Acute Care

## 2024-06-29 ENCOUNTER — Encounter: Payer: Self-pay | Admitting: Acute Care

## 2024-06-29 DIAGNOSIS — Z87891 Personal history of nicotine dependence: Secondary | ICD-10-CM

## 2024-06-29 DIAGNOSIS — Z122 Encounter for screening for malignant neoplasm of respiratory organs: Secondary | ICD-10-CM

## 2024-06-29 NOTE — Telephone Encounter (Signed)
 Lung Cancer Screening Narrative/Criteria Questionnaire (Cigarette Smokers Only- No Cigars/Pipes/vapes)   Molly Hanson   SDMV: 07/06/2024 1:45p Katy  05-Feb-1956   LDCT: 07/07/2024 2:10p GI    67 y.o.   Phone: [phone]  Lung Screening Narrative (confirm age 87-77 yrs Medicare / 50-80 yrs Private pay insurance)   Insurance information:[employer] mcr and mcd   Referring Provider:  Heilingoetter, Cassandra L, PA-C   This screening involves an initial phone call with a team member from our program. It is called a shared decision making visit. The initial meeting is required by  insurance and Medicare to make sure you understand the program. This appointment takes about 15-20 minutes to complete. You will complete the screening scan at your scheduled date/time.  This scan takes about 5-10 minutes to complete. You can eat and drink normally before and after the scan.  Criteria questions for Lung Cancer Screening:   Are you a current or former smoker? Former Age began smoking: 68yo   If you are a former smoker, what year did you quit smoking? 2021(within 15 yrs)   To calculate your smoking history, I need an accurate estimate of how many packs of cigarettes you smoked per day and for how many years. (Not just the number of PPD you are now smoking)   Years smoking 48 x Packs per day 1/2 = Pack years 24   (at least 20 pack yrs)   (Make sure they understand that we need to know how much they have smoked in the past, not just the number of PPD they are smoking now)  Do you have a personal history of cancer?  No    Do you have a family history of cancer? No  Are you coughing up blood?  No  Have you had unexplained weight loss of 15 lbs or more in the last 6 months? No  It looks like you meet all criteria.  When would be a good time for us  to schedule you for this screening?   Additional information: N/A

## 2024-07-06 ENCOUNTER — Encounter: Payer: Self-pay | Admitting: Adult Health

## 2024-07-06 ENCOUNTER — Ambulatory Visit (INDEPENDENT_AMBULATORY_CARE_PROVIDER_SITE_OTHER): Admitting: Adult Health

## 2024-07-06 DIAGNOSIS — Z87891 Personal history of nicotine dependence: Secondary | ICD-10-CM | POA: Diagnosis not present

## 2024-07-06 NOTE — Progress Notes (Signed)
  Virtual Visit via Telephone Note  I connected with Molly Hanson , 07/06/24 1:54 PM by a telemedicine application and verified that I am speaking with the correct person using two identifiers.  Location: Patient: home Provider: home   I discussed the limitations of evaluation and management by telemedicine and the availability of in person appointments. The patient expressed understanding and agreed to proceed.   Shared Decision Making Visit Lung Cancer Screening Program [phone])   Eligibility: 68 y.o. Pack Years Smoking History Calculation = 24 pack years  (# packs/per year x # years smoked) Recent History of coughing up blood  no Unexplained weight loss? no ( >Than 15 pounds within the last 6 months ) Prior History Lung / other cancer no (Diagnosis within the last 5 years already requiring surveillance chest CT Scans). Smoking Status Former Smoker Former Smokers: Years since quit: 4 years  Quit Date: 2021  Visit Components: Discussion included one or more decision making aids. YES Discussion included risk/benefits of screening. YES Discussion included potential follow up diagnostic testing for abnormal scans. YES Discussion included meaning and risk of over diagnosis. YES Discussion included meaning and risk of False Positives. YES Discussion included meaning of total radiation exposure. YES  Counseling Included: Importance of adherence to annual lung cancer LDCT screening. YES Impact of comorbidities on ability to participate in the program. YES Ability and willingness to under diagnostic treatment. YES  Smoking Cessation Counseling: Former Smokers:  Discussed the importance of maintaining cigarette abstinence. yes Diagnosis Code: Personal History of Nicotine Dependence. S12.108 Information about tobacco cessation classes and interventions provided to patient. Yes Patient provided with ticket for LDCT Scan. yes Written Order for Lung Cancer Screening with LDCT  placed in Epic. Yes (CT Chest Lung Cancer Screening Low Dose W/O CM) [id number]  Z12.2-Screening of respiratory organs Z87.891-Personal history of nicotine dependence   [name] 07/06/24

## 2024-07-06 NOTE — Patient Instructions (Signed)

## 2024-07-07 ENCOUNTER — Ambulatory Visit
Admission: RE | Admit: 2024-07-07 | Discharge: 2024-07-07 | Disposition: A | Discharge status: Home / Self Care | Source: Ambulatory Visit | Attending: Acute Care | Admitting: Acute Care

## 2024-07-07 DIAGNOSIS — Z122 Encounter for screening for malignant neoplasm of respiratory organs: Secondary | ICD-10-CM | POA: Diagnosis not present

## 2024-07-07 DIAGNOSIS — Z87891 Personal history of nicotine dependence: Secondary | ICD-10-CM | POA: Diagnosis not present

## 2024-07-18 ENCOUNTER — Inpatient Hospital Stay (HOSPITAL_BASED_OUTPATIENT_CLINIC_OR_DEPARTMENT_OTHER): Admitting: Hematology and Oncology

## 2024-07-18 ENCOUNTER — Other Ambulatory Visit: Payer: Self-pay | Admitting: Hematology and Oncology

## 2024-07-18 ENCOUNTER — Inpatient Hospital Stay

## 2024-07-18 VITALS — BP 137/79 | HR 79 | Temp 97.7°F | Resp 14 | Wt 228.8 lb

## 2024-07-18 DIAGNOSIS — E7800 Pure hypercholesterolemia, unspecified: Secondary | ICD-10-CM | POA: Diagnosis not present

## 2024-07-18 DIAGNOSIS — R0683 Snoring: Secondary | ICD-10-CM | POA: Diagnosis not present

## 2024-07-18 DIAGNOSIS — R7989 Other specified abnormal findings of blood chemistry: Secondary | ICD-10-CM | POA: Diagnosis not present

## 2024-07-18 DIAGNOSIS — D751 Secondary polycythemia: Secondary | ICD-10-CM

## 2024-07-18 DIAGNOSIS — G4730 Sleep apnea, unspecified: Secondary | ICD-10-CM | POA: Diagnosis not present

## 2024-07-18 DIAGNOSIS — M129 Arthropathy, unspecified: Secondary | ICD-10-CM | POA: Diagnosis not present

## 2024-07-18 DIAGNOSIS — M549 Dorsalgia, unspecified: Secondary | ICD-10-CM | POA: Diagnosis not present

## 2024-07-18 DIAGNOSIS — M7989 Other specified soft tissue disorders: Secondary | ICD-10-CM | POA: Diagnosis not present

## 2024-07-18 LAB — CBC WITH DIFFERENTIAL (CANCER CENTER ONLY)
Abs Immature Granulocytes: 0.01 K/uL (ref 0.00–0.07)
Basophils Absolute: 0.1 K/uL (ref 0.0–0.1)
Basophils Relative: 1 %
Eosinophils Absolute: 0.1 K/uL (ref 0.0–0.5)
Eosinophils Relative: 2 %
HCT: 43.9 % (ref 36.0–46.0)
Hemoglobin: 14.3 g/dL (ref 12.0–15.0)
Immature Granulocytes: 0 %
Lymphocytes Relative: 24 %
Lymphs Abs: 1.8 K/uL (ref 0.7–4.0)
MCH: 28.5 pg (ref 26.0–34.0)
MCHC: 32.6 g/dL (ref 30.0–36.0)
MCV: 87.5 fL (ref 80.0–100.0)
Monocytes Absolute: 0.5 K/uL (ref 0.1–1.0)
Monocytes Relative: 7 %
Neutro Abs: 4.9 K/uL (ref 1.7–7.7)
Neutrophils Relative %: 66 %
Platelet Count: 258 K/uL (ref 150–400)
RBC: 5.02 MIL/uL (ref 3.87–5.11)
RDW: 12.6 % (ref 11.5–15.5)
WBC Count: 7.4 K/uL (ref 4.0–10.5)
nRBC: 0 % (ref 0.0–0.2)

## 2024-07-18 LAB — C-REACTIVE PROTEIN: CRP: 1.3 mg/dL — ABNORMAL HIGH (ref <1.0)

## 2024-07-18 LAB — SEDIMENTATION RATE: Sed Rate: 20 mm/h (ref 0–22)

## 2024-07-18 LAB — FERRITIN: Ferritin: 421 ng/mL — ABNORMAL HIGH (ref 11–307)

## 2024-07-18 LAB — LACTATE DEHYDROGENASE: LDH: 180 U/L (ref 98–192)

## 2024-07-18 NOTE — Progress Notes (Signed)
 [facility] [phone]   [phone]  PROGRESS NOTE  Patient Care Team: Lorren Greig PARAS, NP as PCP - General (Nurse Practitioner)  Hematological/Oncological History # Erythrocytosis # Elevated Ferritin  06/23/2024: establish care with Dr. Federico   Interval History:  Molly Hanson 68 y.o. female with medical history significant for erythrocytosis and elevated ferritin who presents for a follow up visit. The patient's last visit was on 06/23/2024. In the interim since the last visit she has had no major changes in her health.  On exam today Molly Hanson reports that she has been well overall with interim since her last visit.  She has had no issues with inflammatory symptoms such as joint pain or skin issues.  She does have some occasional finger swelling.  She notes no history of liver disease.  She reports he does have some occasional pain in the abdomen and will be seeing a neurologist for this.  She reports it is in a bandlike distribution around her abdomen. She reports that she does not take any medications or nutritional vitamin supplements.  She reports she does not eat red meat and prefers [place].  She notes that she does have some family history of gout and hypertension in her maternal grandfather but her maternal grandmother may have had some iron issues.  Otherwise she has been her baseline level of health.  She denies any fevers, chills, sweats, nausea, vomiting or diarrhea.  A full 10 point ROS is otherwise negative.  MEDICAL HISTORY:  Past Medical History:  Diagnosis Date   Arthritis    knees, arm    Hyperlipidemia    has been elevated in the past - no meds    Vitamin D  deficiency     SURGICAL HISTORY: Past Surgical History:  Procedure Laterality Date   ABDOMINAL HYSTERECTOMY     COLONOSCOPY     > 10 yrs ago- normal     SOCIAL HISTORY: Social History   Socioeconomic History   Marital status: Single    Spouse name: Not on file    Number of children: Not on file   Years of education: Not on file   Highest education level: Associate degree: academic program  Occupational History   Not on file  Tobacco Use   Smoking status: Former    Current packs/day: 0.00    Types: Cigarettes    Quit date: 07/15/2020    Years since quitting: 4.0    Passive exposure: Past   Smokeless tobacco: Never  Vaping Use   Vaping status: Never Used  Substance and Sexual Activity   Alcohol use: Yes    Comment: Occassionally.   Drug use: Never   Sexual activity: Not Currently  Other Topics Concern   Not on file  Social History Narrative   Not on file   Social Drivers of Health   Financial Resource Strain: Medium Risk (04/29/2024)   Overall Financial Resource Strain (CARDIA)    Difficulty of Paying Living Expenses: Somewhat hard  Food Insecurity: Food Insecurity Present (06/23/2024)   Hunger Vital Sign    Worried About Running Out of Food in the Last Year: Sometimes true    Ran Out of Food in the Last Year: Sometimes true  Transportation Needs: No Transportation Needs (06/23/2024)   PRAPARE - Administrator, Civil Service (Medical): No    Lack of Transportation (Non-Medical): No  Physical Activity: Insufficiently Active (04/29/2024)   Exercise Vital Sign    Days of Exercise  per Week: 3 days    Minutes of Exercise per Session: 30 min  Stress: No Stress Concern Present (04/29/2024)   [name] - Occupational Stress Questionnaire    Feeling of Stress : Only a little  Recent Concern: Stress - Stress Concern Present (04/09/2024)   [name] - Occupational Stress Questionnaire    Feeling of Stress : To some extent  Social Connections: Moderately Integrated (04/29/2024)   Social Connection and Isolation Panel    Frequency of Communication with Friends and Family: More than three times a week    Frequency of Social Gatherings with Friends and Family: Once a week    Attends  Religious Services: More than 4 times per year    Active Member of [employer] or Organizations: Yes    Attends Engineer, structural: More than 4 times per year    Marital Status: Divorced  Intimate Partner Violence: Not At Risk (06/23/2024)   Humiliation, Afraid, Rape, and Kick questionnaire    Fear of Current or Ex-Partner: No    Emotionally Abused: No    Physically Abused: No    Sexually Abused: No    FAMILY HISTORY: Family History  Problem Relation Age of Onset   Breast cancer Mother    Diabetes Mother    Diabetes Father    Diabetes Maternal Grandmother    Stroke Maternal Grandmother    Colon polyps Maternal Grandmother    Colon cancer Maternal Grandfather    Diabetes Paternal Grandmother    Colon polyps Maternal Uncle    Esophageal cancer Neg Hx    Rectal cancer Neg Hx    Stomach cancer Neg Hx     ALLERGIES:  has no known allergies.  MEDICATIONS:  Current Outpatient Medications  Medication Sig Dispense Refill   albuterol  (VENTOLIN  HFA) 108 (90 Base) MCG/ACT inhaler Inhale 2 puffs into the lungs every 6 (six) hours as needed for wheezing or shortness of breath. 8 g 2   atorvastatin  (LIPITOR) 20 MG tablet Take 1 tablet (20 mg total) by mouth daily. (Patient not taking: Reported on 06/23/2024) 90 tablet 0   clotrimazole -betamethasone  (LOTRISONE ) cream Apply 1 Application topically daily. 45 g 2   gabapentin  (NEURONTIN ) 300 MG capsule Take 1 capsule (300 mg total) by mouth daily as needed. (Patient not taking: Reported on 06/23/2024) 90 capsule 0   meclizine  (ANTIVERT ) 12.5 MG tablet Take 1 tablet (12.5 mg total) by mouth 3 (three) times daily as needed for dizziness. (Patient not taking: Reported on 06/23/2024) 30 tablet 1   methocarbamol  (ROBAXIN ) 500 MG tablet Take 1 tablet (500 mg total) by mouth 2 (two) times daily. (Patient not taking: Reported on 06/23/2024) 20 tablet 0   omeprazole  (PRILOSEC) 20 MG capsule Take 1 capsule (20 mg total) by mouth daily. (Patient not taking:  Reported on 06/23/2024) 90 capsule 0   prednisoLONE acetate (PRED FORTE) 1 % ophthalmic suspension Place 1 drop into the right eye in the morning and at bedtime.     No current facility-administered medications for this visit.    REVIEW OF SYSTEMS:   Constitutional: ( - ) fevers, ( - )  chills , ( - ) night sweats Eyes: ( - ) blurriness of vision, ( - ) double vision, ( - ) watery eyes Ears, nose, mouth, throat, and face: ( - ) mucositis, ( - ) sore throat Respiratory: ( - ) cough, ( - ) dyspnea, ( - ) wheezes Cardiovascular: ( - ) palpitation, ( - )  chest discomfort, ( - ) lower extremity swelling Gastrointestinal:  ( - ) nausea, ( - ) heartburn, ( - ) change in bowel habits Skin: ( - ) abnormal skin rashes Lymphatics: ( - ) new lymphadenopathy, ( - ) easy bruising Neurological: ( - ) numbness, ( - ) tingling, ( - ) new weaknesses Behavioral/Psych: ( - ) mood change, ( - ) new changes  All other systems were reviewed with the patient and are negative.  PHYSICAL EXAMINATION:  Vitals:   07/18/24 1012  BP: 137/79  Pulse: 79  Resp: 14  Temp: 97.7 F (36.5 C)  SpO2: 98%   Filed Weights   07/18/24 1012  Weight: 228 lb 12.8 oz (103.8 kg)    GENERAL: Well-appearing elderly African-American female, alert, no distress and comfortable SKIN: skin color, texture, turgor are normal, no rashes or significant lesions EYES: conjunctiva are pink and non-injected, sclera clear LUNGS: clear to auscultation and percussion with normal breathing effort HEART: regular rate & rhythm and no murmurs and no lower extremity edema Musculoskeletal: no cyanosis of digits and no clubbing  PSYCH: alert & oriented x 3, fluent speech NEURO: no focal motor/sensory deficits  LABORATORY DATA:  I have reviewed the data as listed    Latest Ref Rng & Units 07/18/2024    9:36 AM 06/23/2024    9:39 AM 05/18/2024   11:16 AM  CBC  WBC 4.0 - 10.5 K/uL 7.4  9.2  6.7   Hemoglobin 12.0 - 15.0 g/dL 85.6  85.3  85.0    Hematocrit 36.0 - 46.0 % 43.9  44.6  45.8   Platelets 150 - 400 K/uL 258  238  259        Latest Ref Rng & Units 06/23/2024    9:39 AM 05/18/2024   11:16 AM 05/10/2024    2:29 PM  CMP  Glucose 70 - 99 mg/dL 894  848  94   BUN 8 - 23 mg/dL 13  12  14    Creatinine 0.44 - 1.00 mg/dL 9.08  9.00  9.09   Sodium 135 - 145 mmol/L 141  140  142   Potassium 3.5 - 5.1 mmol/L 4.0  4.1  4.5   Chloride 98 - 111 mmol/L 106  104  105   CO2 22 - 32 mmol/L 30  24  22    Calcium  8.9 - 10.3 mg/dL 9.6  9.4  9.6   Total Protein 6.5 - 8.1 g/dL 7.5  7.3  7.1   Total Bilirubin 0.0 - 1.2 mg/dL 0.6  0.7  0.4   Alkaline Phos 38 - 126 U/L 67  61  83   AST 15 - 41 U/L 15  24  15    ALT 0 - 44 U/L 17  18  15     RADIOGRAPHIC STUDIES: No results found.  ASSESSMENT & PLAN REESE SENK 68 y.o. female with medical history significant for erythrocytosis and elevated ferritin who presents for a follow up visit.   Elevated serum ferritin levels have numerous possible etiologies. These include hereditary hemochromatosis (heterozygous or homozygous), inflammation, liver disease, or iron overload from an exogenous source. Hereditary hemochromatosis is a hereditary condition caused by mutations in the HFE gene, which regulates iron absorption. The most common genes mutated in this condition are the C282Y and H63D genes. Homozygous mutations represent a disease state which requires phlebotomy to decrease ferritin levels to a goal of <50  (Blood?(2010) 116 (3): 317-325). The goal is to decrease ferritin so there is  no deposition in critical organs (liver, heart, pancreas and thyroid ). Heterozygous mutations (or compound heterozygotes) rarely require phlebotomy, but do have elevated serum iron/ferritin levels.  Ferritin is an acute phase reactant and can be elevated with systemic inflammation. Direct damage to liver tissue can also cause spillage of ferritin into the blood, resulting in elevated ferritin.  Additionally, serum iron  levels can be quite transient and an elevation or serum iron may not represent a true overload of total body iron (best lab for this is ferritin).    #Elevated Iron/Ferritin  --labs to include CBC, CMP, LDH, ESR, CRP  --will repeat iron panel and ferritin today  --will send for HFE gene mutation. If found to have homozygous mutation for HFE will begin phlebotomies every other week with goal ferritin <50   --Prior abdominal imaging on 05/18/2024 showed no evidence of liver disease. --if patient confirmed to have hereditary hemochromatosis will order TSH, TTE, and Hepatitis B/C panels.   --RTC pending results of above studies.      # Erythrocytosis  -- Appears to have resolved.  No orders of the defined types were placed in this encounter.   All questions were answered. The patient knows to call the clinic with any problems, questions or concerns.  A total of more than 30 minutes were spent on this encounter with face-to-face time and non-face-to-face time, including preparing to see the patient, ordering tests and/or medications, counseling the patient and coordination of care as outlined above.   Norleen IVAR Kidney, MD Department of Hematology/Oncology [facility] Phone: [phone] Pager: [phone] Email: [email or web address]  07/18/2024 3:12 PM

## 2024-07-20 LAB — ANTINUCLEAR ANTIBODIES, IFA: ANA Ab, IFA: POSITIVE — AB

## 2024-07-20 LAB — FANA STAINING PATTERNS: Speckled Pattern: 24529 — ABNORMAL HIGH

## 2024-07-25 ENCOUNTER — Other Ambulatory Visit: Payer: Self-pay | Admitting: Acute Care

## 2024-07-25 DIAGNOSIS — Z122 Encounter for screening for malignant neoplasm of respiratory organs: Secondary | ICD-10-CM

## 2024-07-25 DIAGNOSIS — Z87891 Personal history of nicotine dependence: Secondary | ICD-10-CM

## 2024-07-25 LAB — HEMOCHROMATOSIS DNA-PCR(C282Y,H63D)

## 2024-08-01 ENCOUNTER — Ambulatory Visit (INDEPENDENT_AMBULATORY_CARE_PROVIDER_SITE_OTHER): Admitting: Neurology

## 2024-08-01 ENCOUNTER — Encounter: Payer: Self-pay | Admitting: Neurology

## 2024-08-01 VITALS — BP 132/84 | HR 85 | Ht 66.0 in | Wt 231.0 lb

## 2024-08-01 DIAGNOSIS — M5417 Radiculopathy, lumbosacral region: Secondary | ICD-10-CM | POA: Diagnosis not present

## 2024-08-01 NOTE — Patient Instructions (Addendum)
 Start physical therapy  Follow-up with your primary care provider for joint pain  Please update me in 6-8 weeks after doing physical therapy.

## 2024-08-01 NOTE — Progress Notes (Signed)
 [facility] Clinic Note - Initial Visit   Date: 08/01/2024   Molly Hanson MRN: [id number] DOB: 01/15/1956   Dear Molly Drones, NP:  Thank you for your kind referral of Molly Hanson for consultation of left leg numbness. Although her history is well known to you, please allow us  to reiterate it for the purpose of our medical record. The patient was accompanied to the clinic by self.  Molly Hanson is a 68 y.o. right-handed female with hyperlipidemia and GERD presenting for evaluation of left leg pain.   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  No benefit with muscle relaxants.   - Start PT for low back strengthening  - Consider MRI lumbar spine, if no improvement  - Patient to send MyChart update in 6-8 weeks  Polyarthralgia  - Follow-up with PCP  ------------------------------------------------------------- History of present illness: Starting in early 2025, she began having numbness involving the lateral left leg and foot.  Symptoms worse if she is laying on her side and improved when laying on her back.  She also has low back pain on the left side, described as achy. She does not have shooting pain down the legs.    She complains of popping and stiffness of the knees which makes it difficult to get up to walk.  Sometimes, she also has swelling and stiffness of the right index finger.    Out-side paper records, electronic medical record, and images have been reviewed where available and summarized as:  Lab Results  Component Value Date   HGBA1C 6.1 (H) 05/10/2024   Lab Results  Component Value Date   VITAMINB12 364 06/23/2024   Lab Results  Component Value Date   TSH 0.909 05/10/2024   Lab Results  Component Value Date   ESRSEDRATE 20 07/18/2024    Past Medical History:  Diagnosis Date   Arthritis    knees, arm     Hyperlipidemia    has been elevated in the past - no meds    Vitamin D  deficiency     Past Surgical History:  Procedure Laterality Date   ABDOMINAL HYSTERECTOMY     COLONOSCOPY     > 10 yrs ago- normal      Medications:  Outpatient Encounter Medications as of 08/01/2024  Medication Sig Note   albuterol  (VENTOLIN  HFA) 108 (90 Base) MCG/ACT inhaler Inhale 2 puffs into the lungs every 6 (six) hours as needed for wheezing or shortness of breath. 02/29/2024: As needed   clotrimazole -betamethasone  (LOTRISONE ) cream Apply 1 Application topically daily.    prednisoLONE acetate (PRED FORTE) 1 % ophthalmic suspension Place 1 drop into the right eye in the morning and at bedtime.    atorvastatin  (LIPITOR) 20 MG tablet Take 1 tablet (20 mg total) by mouth daily. (Patient not taking: Reported on 06/23/2024)    gabapentin  (NEURONTIN ) 300 MG capsule Take 1 capsule (300 mg total) by mouth daily as needed. (Patient not taking: Reported on 06/23/2024)    meclizine  (ANTIVERT ) 12.5 MG tablet Take 1 tablet (12.5 mg total) by mouth 3 (three) times daily as needed for dizziness. (Patient not taking: Reported on 08/01/2024)    methocarbamol  (ROBAXIN ) 500 MG tablet Take 1 tablet (500 mg total) by mouth 2 (two) times daily. (Patient not taking: Reported on 08/01/2024)    omeprazole  (PRILOSEC) 20 MG capsule Take 1 capsule (20 mg total) by mouth daily. (Patient not taking: Reported on  06/23/2024)    No facility-administered encounter medications on file as of 08/01/2024.    Allergies: No Known Allergies  Family History: Family History  Problem Relation Age of Onset   Breast cancer Mother    Diabetes Mother    Diabetes Father    Diabetes Maternal Grandmother    Stroke Maternal Grandmother    Colon polyps Maternal Grandmother    Colon cancer Maternal Grandfather    Diabetes Paternal Grandmother    Colon polyps Maternal Uncle    Esophageal cancer Neg Hx    Rectal cancer Neg Hx    Stomach cancer Neg Hx     Social  History: Social History   Tobacco Use   Smoking status: Former    Current packs/day: 0.00    Types: Cigarettes    Quit date: 07/15/2020    Years since quitting: 4.0    Passive exposure: Past   Smokeless tobacco: Never  Vaping Use   Vaping status: Never Used  Substance Use Topics   Alcohol use: Not Currently    Comment: Occassionally.   Drug use: Never   Social History   Social History Narrative   Are you right handed or left handed? Right Handed    Are you currently employed ? No    What is your current occupation? Retired   Do you live at home alone? Yes   Who lives with you? No one    What type of home do you live in: 1 story or 2 story? Two story home        Vital Signs:  BP 132/84   Pulse 85   Ht 5' 6 (1.676 m)   Wt 231 lb (104.8 kg)   SpO2 98%   BMI 37.28 kg/m   Neurological Exam: MENTAL STATUS including orientation to time, place, person, recent and remote memory, attention span and concentration, language, and fund of knowledge is normal.  Speech is not dysarthric.  CRANIAL NERVES: II:  No visual field defects.     III-IV-VI: Pupils equal round and reactive to light.  Normal conjugate, extra-ocular eye movements in all directions of gaze.  No nystagmus.  No ptosis.   V:  Normal facial sensation.    VII:  Normal facial symmetry and movements.   VIII:  Normal hearing and vestibular function.   IX-X:  Normal palatal movement.   XI:  Normal shoulder shrug and head rotation.   XII:  Normal tongue strength and range of motion, no deviation or fasciculation.  MOTOR:  No atrophy, fasciculations or abnormal movements.  No pronator drift.   Upper Extremity:  Right  Left  Deltoid  5/5   5/5   Biceps  5/5   5/5   Triceps  5/5   5/5   Wrist extensors  5/5   5/5   Wrist flexors  5/5   5/5   Finger extensors  5/5   5/5   Finger flexors  5/5   5/5   Dorsal interossei  5/5   5/5   Tone (Ashworth scale)  0  0   Lower Extremity:  Right  Left  Hip flexors  5/5    5/5   Knee flexors  5/5   5/5   Knee extensors  5/5   5/5   Dorsiflexors  5/5   5/5   Plantarflexors  5/5   5/5   Toe extensors  5/5   5/5   Toe flexors  5/5   5/5   Tone (Ashworth  scale)  0  0   MSRs:                                           Right        Left brachioradialis 2+  2+  biceps 2+  2+  triceps 2+  2+  patellar 1+  1+  ankle jerk 1+  1+  Hoffman no  no  plantar response down  down   SENSORY:  Reduced vibration at the left great toe, pin prick and temperature intact throughout.    COORDINATION/GAIT: Normal finger-to- nose-finger.  Intact rapid alternating movements bilaterally.   Gait is mildly wide-based, stable, unassisted.    Thank you for allowing me to participate in patient's care.  If I can answer any additional questions, I would be pleased to do so.    Sincerely,    Ula Couvillon K. Tobie, DO

## 2024-08-02 ENCOUNTER — Ambulatory Visit (HOSPITAL_BASED_OUTPATIENT_CLINIC_OR_DEPARTMENT_OTHER)
Admission: RE | Admit: 2024-08-02 | Discharge: 2024-08-02 | Disposition: A | Discharge status: Home / Self Care | Source: Ambulatory Visit | Attending: Family | Admitting: Family

## 2024-08-02 DIAGNOSIS — Z780 Asymptomatic menopausal state: Secondary | ICD-10-CM | POA: Insufficient documentation

## 2024-08-02 DIAGNOSIS — Z13820 Encounter for screening for osteoporosis: Secondary | ICD-10-CM | POA: Diagnosis not present

## 2024-08-07 ENCOUNTER — Encounter: Payer: Self-pay | Admitting: Family

## 2024-08-08 ENCOUNTER — Institutional Professional Consult (permissible substitution): Admitting: Neurology

## 2024-08-09 NOTE — Telephone Encounter (Signed)
 Schedule appointment. During the interim report to Emergency Department/Urgent Care/call 911 for immediate medical evaluation.

## 2024-08-12 ENCOUNTER — Other Ambulatory Visit: Payer: Self-pay | Admitting: Hematology and Oncology

## 2024-08-12 ENCOUNTER — Other Ambulatory Visit: Payer: Self-pay | Admitting: Family

## 2024-08-12 DIAGNOSIS — E785 Hyperlipidemia, unspecified: Secondary | ICD-10-CM

## 2024-08-12 DIAGNOSIS — R7989 Other specified abnormal findings of blood chemistry: Secondary | ICD-10-CM

## 2024-08-14 ENCOUNTER — Ambulatory Visit: Payer: Self-pay | Admitting: *Deleted

## 2024-08-14 NOTE — Telephone Encounter (Signed)
-----   Message from Norleen ONEIDA Kidney IV sent at 08/12/2024  4:26 PM EDT ----- Please let Ms. Ziff know that her elevated ferritin is most likely secondary to an inflammatory disorder.  Her labs did show elevated inflammatory markers.  I made a referral to rheumatology for  further evaluation.  There is no need for further routine follow-up in our clinic.  ----- Message ----- From: Rebecka, Lab In [place] Sent: 07/18/2024   9:54 AM EDT To: Norleen ONEIDA Kidney [place], MD

## 2024-08-14 NOTE — Telephone Encounter (Signed)
 TCT patient regarding recent lab results. No answer but was able to leave vm for pt to return this call at her convenience to [phone]

## 2024-08-21 ENCOUNTER — Ambulatory Visit: Attending: Neurology | Admitting: Physical Therapy

## 2024-08-21 ENCOUNTER — Emergency Department (HOSPITAL_COMMUNITY)
Admission: EM | Admit: 2024-08-21 | Discharge: 2024-08-21 | Disposition: A | Discharge status: Home / Self Care | Attending: Emergency Medicine | Admitting: Emergency Medicine

## 2024-08-21 ENCOUNTER — Other Ambulatory Visit: Payer: Self-pay

## 2024-08-21 ENCOUNTER — Emergency Department (HOSPITAL_COMMUNITY)

## 2024-08-21 ENCOUNTER — Encounter (HOSPITAL_COMMUNITY): Payer: Self-pay

## 2024-08-21 ENCOUNTER — Encounter: Payer: Self-pay | Admitting: Physical Therapy

## 2024-08-21 DIAGNOSIS — R2689 Other abnormalities of gait and mobility: Secondary | ICD-10-CM | POA: Diagnosis not present

## 2024-08-21 DIAGNOSIS — N750 Cyst of Bartholin's gland: Secondary | ICD-10-CM | POA: Diagnosis not present

## 2024-08-21 DIAGNOSIS — M5459 Other low back pain: Secondary | ICD-10-CM | POA: Diagnosis not present

## 2024-08-21 DIAGNOSIS — N83201 Unspecified ovarian cyst, right side: Secondary | ICD-10-CM | POA: Diagnosis not present

## 2024-08-21 DIAGNOSIS — M5417 Radiculopathy, lumbosacral region: Secondary | ICD-10-CM | POA: Insufficient documentation

## 2024-08-21 DIAGNOSIS — M6281 Muscle weakness (generalized): Secondary | ICD-10-CM | POA: Diagnosis not present

## 2024-08-21 DIAGNOSIS — R1031 Right lower quadrant pain: Secondary | ICD-10-CM | POA: Diagnosis not present

## 2024-08-21 DIAGNOSIS — K5730 Diverticulosis of large intestine without perforation or abscess without bleeding: Secondary | ICD-10-CM | POA: Diagnosis not present

## 2024-08-21 LAB — CBC WITH DIFFERENTIAL/PLATELET
Abs Immature Granulocytes: 0.03 K/uL (ref 0.00–0.07)
Basophils Absolute: 0 K/uL (ref 0.0–0.1)
Basophils Relative: 1 %
Eosinophils Absolute: 0.1 K/uL (ref 0.0–0.5)
Eosinophils Relative: 1 %
HCT: 43.1 % (ref 36.0–46.0)
Hemoglobin: 13.8 g/dL (ref 12.0–15.0)
Immature Granulocytes: 0 %
Lymphocytes Relative: 26 %
Lymphs Abs: 2.2 K/uL (ref 0.7–4.0)
MCH: 28.6 pg (ref 26.0–34.0)
MCHC: 32 g/dL (ref 30.0–36.0)
MCV: 89.2 fL (ref 80.0–100.0)
Monocytes Absolute: 0.7 K/uL (ref 0.1–1.0)
Monocytes Relative: 8 %
Neutro Abs: 5.3 K/uL (ref 1.7–7.7)
Neutrophils Relative %: 64 %
Platelets: 297 K/uL (ref 150–400)
RBC: 4.83 MIL/uL (ref 3.87–5.11)
RDW: 12.5 % (ref 11.5–15.5)
WBC: 8.4 K/uL (ref 4.0–10.5)
nRBC: 0 % (ref 0.0–0.2)

## 2024-08-21 LAB — COMPREHENSIVE METABOLIC PANEL WITH GFR
ALT: 21 U/L (ref 0–44)
AST: 20 U/L (ref 15–41)
Albumin: 3.5 g/dL (ref 3.5–5.0)
Alkaline Phosphatase: 72 U/L (ref 38–126)
Anion gap: 11 (ref 5–15)
BUN: 15 mg/dL (ref 8–23)
CO2: 22 mmol/L (ref 22–32)
Calcium: 8.8 mg/dL — ABNORMAL LOW (ref 8.9–10.3)
Chloride: 104 mmol/L (ref 98–111)
Creatinine, Ser: 0.81 mg/dL (ref 0.44–1.00)
GFR, Estimated: >60 mL/min (ref >60)
Glucose, Bld: 108 mg/dL — ABNORMAL HIGH (ref 70–99)
Potassium: 4.2 mmol/L (ref 3.5–5.1)
Sodium: 137 mmol/L (ref 135–145)
Total Bilirubin: 0.5 mg/dL (ref 0.0–1.2)
Total Protein: 7.5 g/dL (ref 6.5–8.1)

## 2024-08-21 LAB — LIPASE, BLOOD: Lipase: 37 U/L (ref 11–51)

## 2024-08-21 LAB — URINALYSIS, ROUTINE W REFLEX MICROSCOPIC
Bilirubin Urine: NEGATIVE
Glucose, UA: NEGATIVE mg/dL
Hgb urine dipstick: NEGATIVE
Ketones, ur: NEGATIVE mg/dL
Leukocytes,Ua: NEGATIVE
Nitrite: NEGATIVE
Protein, ur: NEGATIVE mg/dL
Specific Gravity, Urine: 1.045 — ABNORMAL HIGH (ref 1.005–1.030)
pH: 6 (ref 5.0–8.0)

## 2024-08-21 MED ORDER — IOHEXOL 350 MG/ML SOLN
75.0000 mL | Freq: Once | INTRAVENOUS | Status: AC | PRN
  Administered 2024-08-21: 75 mL via INTRAVENOUS

## 2024-08-21 MED ORDER — SIMETHICONE 80 MG PO CHEW: 80.0000 mg | CHEWABLE_TABLET | Freq: Four times a day (QID) | ORAL | 0 refills | Status: DC | PRN

## 2024-08-21 MED ORDER — POLYETHYLENE GLYCOL 3350 17 GM/SCOOP PO POWD: 17.0000 g | Freq: Three times a day (TID) | ORAL | 0 refills | Status: AC

## 2024-08-21 NOTE — Discharge Instructions (Addendum)
 Your workup today is quite reassuring you do have some swelling of the Bartholin gland in your vulva you may have this rechecked with your primary care doctor in 2 weeks or sooner if you have any pain in this area however this is essentially a incidental finding.  Given that you have a reassuringly normal CT scan today I recommend that you use MiraLAX once daily, drink plenty of water and make sure you are walking daily this will help with your bowel health.  Also take simethicone as prescribed.

## 2024-08-21 NOTE — ED Provider Notes (Signed)
 [place] [facility] Provider Note   CSN: [id number] Arrival date & time: 08/21/24  1235     Patient presents with: Abdominal Pain and Flank Pain   Molly Hanson is a 68 y.o. female.    Abdominal Pain Flank Pain Associated symptoms include abdominal pain.   Patient is a 68 year old female with a past medical history significant for HLD, abdominal hysterectomy, arthritis  Pt complains of right lower quadrant abdominal pain.  Patient presents with pain over the past 4 days.  Worsening.  Not moving.  She continues to eat and drink.  She has had some constipation, tried a laxative without improvement.  She may have a history of diverticulitis, does report eating some popcorn recently.        Prior to Admission medications   Medication Sig Start Date End Date Taking? Authorizing Provider  polyethylene glycol powder (GLYCOLAX/MIRALAX) 17 GM/SCOOP powder Take 17 g by mouth in the morning, at noon, and at bedtime. One scoop in beverage of your choice with each meal. You may increase if you continue to be constipated and decrease if your stool becomes too loose. 08/21/24 08/22/24 Yes Alison Kubicki, Hamp RAMAN, PA  simethicone (MYLICON) 80 MG chewable tablet Chew 1 tablet (80 mg total) by mouth every 6 (six) hours as needed for flatulence. 08/21/24  Yes Ashlon Lottman, Hamp RAMAN, PA  albuterol  (VENTOLIN  HFA) 108 (90 Base) MCG/ACT inhaler Inhale 2 puffs into the lungs every 6 (six) hours as needed for wheezing or shortness of breath. 02/09/24   Caudle, Thersia Bitters, FNP  atorvastatin  (LIPITOR) 20 MG tablet Take 1 tablet by mouth once daily 08/14/24   Lorren Greig PARAS, NP  clotrimazole -betamethasone  (LOTRISONE ) cream Apply 1 Application topically daily. 05/03/24   Lorren Greig PARAS, NP  gabapentin  (NEURONTIN ) 300 MG capsule Take 1 capsule (300 mg total) by mouth daily as needed. Patient not taking: Reported on 06/23/2024 05/30/24   Lorren Greig PARAS, NP  meclizine  (ANTIVERT ) 12.5 MG  tablet Take 1 tablet (12.5 mg total) by mouth 3 (three) times daily as needed for dizziness. Patient not taking: Reported on 08/01/2024 03/24/24   Lorren Greig PARAS, NP  methocarbamol  (ROBAXIN ) 500 MG tablet Take 1 tablet (500 mg total) by mouth 2 (two) times daily. Patient not taking: Reported on 08/01/2024 05/18/24   Sherrine Birmingham, DO  omeprazole  (PRILOSEC) 20 MG capsule Take 1 capsule (20 mg total) by mouth daily. Patient not taking: Reported on 06/23/2024 05/24/24 08/22/24  Lorren Greig PARAS, NP  prednisoLONE acetate (PRED FORTE) 1 % ophthalmic suspension Place 1 drop into the right eye in the morning and at bedtime.    [provider]    Allergies: Patient has no known allergies.    Review of Systems  Gastrointestinal:  Positive for abdominal pain.  Genitourinary:  Positive for flank pain.    Updated Vital Signs BP 127/67   Pulse 63   Temp 98.5 F (36.9 C)   Resp 17   Ht 5' 6 (1.676 m)   Wt 99.8 kg   SpO2 100%   BMI 35.51 kg/m   Physical Exam Vitals and nursing note reviewed.  Constitutional:      General: She is not in acute distress. HENT:     Head: Normocephalic and atraumatic.     Nose: Nose normal.  Eyes:     General: No scleral icterus. Cardiovascular:     Rate and Rhythm: Normal rate and regular rhythm.     Pulses: Normal pulses.  Heart sounds: Normal heart sounds.  Pulmonary:     Effort: Pulmonary effort is normal. No respiratory distress.     Breath sounds: No wheezing.  Abdominal:     Palpations: Abdomen is soft.     Tenderness: There is abdominal tenderness in the right lower quadrant. There is no guarding. Negative signs include Murphy's sign and Rovsing's sign.  Musculoskeletal:     Cervical back: Normal range of motion.     Right lower leg: No edema.     Left lower leg: No edema.  Skin:    General: Skin is warm and dry.     Capillary Refill: Capillary refill takes less than 2 seconds.  Neurological:     Mental Status: She is alert. Mental status  is at baseline.  Psychiatric:        Mood and Affect: Mood normal.        Behavior: Behavior normal.     (all labs ordered are listed, but only abnormal results are displayed) Labs Reviewed  COMPREHENSIVE METABOLIC PANEL WITH GFR - Abnormal; Notable for the following components:      Result Value   Glucose, Bld 108 (*)    Calcium  8.8 (*)    All other components within normal limits  URINALYSIS, ROUTINE W REFLEX MICROSCOPIC - Abnormal; Notable for the following components:   Color, Urine STRAW (*)    Specific Gravity, Urine 1.045 (*)    All other components within normal limits  LIPASE, BLOOD  CBC WITH DIFFERENTIAL/PLATELET    EKG: None  Radiology: CT ABDOMEN PELVIS W CONTRAST Result Date: 08/21/2024 CLINICAL DATA:  Right lower quadrant abdominal pain. EXAM: CT ABDOMEN AND PELVIS WITH CONTRAST TECHNIQUE: Multidetector CT imaging of the abdomen and pelvis was performed using the standard protocol following bolus administration of intravenous contrast. RADIATION DOSE REDUCTION: This exam was performed according to the departmental dose-optimization program which includes automated exposure control, adjustment of the mA and/or kV according to patient size and/or use of iterative reconstruction technique. CONTRAST:  75mL OMNIPAQUE  IOHEXOL  350 MG/ML SOLN COMPARISON:  CT abdomen and pelvis 05/18/2024. FINDINGS: Lower chest: No acute abnormality. Hepatobiliary: No focal liver abnormality is seen. No gallstones, gallbladder wall thickening, or biliary dilatation. Pancreas: Unremarkable. No pancreatic ductal dilatation or surrounding inflammatory changes. Spleen: Normal in size without focal abnormality. Adrenals/Urinary Tract: Adrenal glands are unremarkable. Kidneys are normal, without renal calculi, focal lesion, or hydronephrosis. Bladder is unremarkable. Stomach/Bowel: Stomach is within normal limits. Appendix appears normal. No evidence of bowel wall thickening, distention, or inflammatory  changes. There are scattered colonic diverticula. Vascular/Lymphatic: No significant vascular findings are present. No enlarged abdominal or pelvic lymph nodes. Reproductive: Uterus and bilateral adnexa are unremarkable. New cystic structure seen in the left labia which is rounded measuring 1.9 cm. Other: No abdominal wall hernia or abnormality. No abdominopelvic ascites. Musculoskeletal: Degenerative changes affect the spine. There stable grade 1 anterolisthesis at L4-L5. IMPRESSION: 1. No acute localizing process in the abdomen or pelvis. 2. Colonic diverticulosis without evidence for diverticulitis. 3. New 1.9 cm cystic structure in the left labia, likely a Bartholin's gland cyst. Electronically Signed   By: Greig Pique M.D.   On: 08/21/2024 15:06     Procedures   Medications Ordered in the ED  iohexol  (OMNIPAQUE ) 350 MG/ML injection 75 mL (75 mLs Intravenous Contrast Given 08/21/24 1430)  Medical Decision Making Risk OTC drugs.   This patient presents to the ED for concern of abd pain, this involves a number of treatment options, and is a complaint that carries with it a moderate to high risk of complications and morbidity. A differential diagnosis was considered for the patient's symptoms which is discussed below:   The causes of generalized abdominal pain include but are not limited to AAA, mesenteric ischemia, appendicitis, diverticulitis, DKA, gastritis, gastroenteritis, AMI, nephrolithiasis, pancreatitis, peritonitis, adrenal insufficiency,lead poisoning, iron toxicity, intestinal ischemia, constipation, UTI,SBO/LBO, splenic rupture, biliary disease, IBD, IBS, PUD, or hepatitis. Ectopic pregnancy, ovarian torsion, PID.    Co morbidities: Discussed in HPI   Brief History:  Patient is a 68 year old female with a past medical history significant for HLD, abdominal hysterectomy, arthritis  Pt complains of right lower quadrant abdominal pain.   Patient presents with pain over the past 4 days.  Worsening.  Not moving.  She continues to eat and drink.  She has had some constipation, tried a laxative without improvement.  She may have a history of diverticulitis, does report eating some popcorn recently.      EMR reviewed including pt PMHx, past surgical history and past visits to ER.   See HPI for more details   Lab Tests:   I personally reviewed all laboratory work and imaging. Metabolic panel without any acute abnormality specifically kidney function within normal limits and no significant electrolyte abnormalities. CBC without leukocytosis or significant anemia.   Imaging Studies:  Abnormal findings. I personally reviewed all imaging studies. Imaging notable for IMPRESSION:  1. No acute localizing process in the abdomen or pelvis.  2. Colonic diverticulosis without evidence for diverticulitis.  3. New 1.9 cm cystic structure in the left labia, likely a  Bartholin's gland cyst.     Cardiac Monitoring:  NA NA   Medicines ordered:    Critical Interventions:     Consults/Attending Physician      Reevaluation:  After the interventions noted above I re-evaluated patient and found that they have :improved   Social Determinants of Health:      Problem List / ED Course:  Patient endorsed abdominal pain earlier seems to have resolved at this time she has no symptoms currently.  Seems that her pain comes and goes I suspect constipation versus gas.  Will treat for both recommend close outpatient follow-up.  She does have evidence of Bartholin's gland cyst on CT scan she states she has no vulvar pain.  Recommend follow-up with PCP regarding this as well or OB/GYN.  Return precautions discussed.  Will discharge home.   Dispostion:  After consideration of the diagnostic results and the patients response to treatment, I feel that the patent would benefit from outpatient follow up.        Final  diagnoses:  Right lower quadrant abdominal pain  Bartholin gland cyst    ED Discharge Orders          Ordered    simethicone (MYLICON) 80 MG chewable tablet  Every 6 hours PRN        08/21/24 1817    polyethylene glycol powder (GLYCOLAX/MIRALAX) 17 GM/SCOOP powder  3 times daily        08/21/24 1817               Neldon Hamp RAMAN, [place] 08/21/24 2347    Tegeler, Lonni PARAS, MD 08/22/24 2325

## 2024-08-21 NOTE — ED Triage Notes (Signed)
 Pt came in via POV d/t lower Rt sided abd pain that feels sharp when she moves a certain way. Also endorses bil flank pain, this has all been bothering her since Thursday. A/Ox4, rates her pain 10/10.

## 2024-08-21 NOTE — Therapy (Signed)
 OUTPATIENT PHYSICAL THERAPY THORACOLUMBAR EVALUATION  Patient Name: Molly Hanson MRN: [id number] DOB:Sep 12, 1956, 67 y.o., female Today's Date: 08/21/2024   PT End of Session - 08/21/24 1229     Visit Number 1    Number of Visits --   1-2x/week   Date for Recertification  10/16/24    Authorization Type [employer] MCR    PT Start Time 1140    PT Stop Time 1225    PT Time Calculation (min) 45 min          Past Medical History:  Diagnosis Date   Arthritis    knees, arm    Hyperlipidemia    has been elevated in the past - no meds    Vitamin D  deficiency    Past Surgical History:  Procedure Laterality Date   ABDOMINAL HYSTERECTOMY     COLONOSCOPY     > 10 yrs ago- normal    Patient Active Problem List   Diagnosis Date Noted   Erythrocytosis 06/23/2024   Chronic abdominal pain 12/02/2023   Abnormal auditory perception of both ears 09/09/2023   Imbalance 09/09/2023   Hx of adenomatous colonic polyps 04/30/2022    PCP: Molly Hanson PARAS, NP  REFERRING PROVIDER: Patel, Molly K, DO  THERAPY DIAG:  Other low back pain - Plan: PT plan of care cert/re-cert  Muscle weakness - Plan: PT plan of care cert/re-cert  Other abnormalities of gait and mobility - Plan: PT plan of care cert/re-cert  REFERRING DIAG: Lumbosacral radiculopathy [M54.17]   Rationale for Evaluation and Treatment:  Rehabilitation  SUBJECTIVE:  PERTINENT PAST HISTORY:  none        PRECAUTIONS: None  WEIGHT BEARING RESTRICTIONS No  FALLS:  Has patient fallen in last 6 months? No, Number of falls: 0  MOI/History of condition:  Onset date: chronic > 3 years  SUBJECTIVE STATEMENT  Pt is a 68 y.o. female who presents to clinic with chief complaint of chronic L sided low back pain with radiation into L LE.  Some N/T in 5th digit of foot.  Pain into L LE.  No acute injury or trauma.  Prefers standing vs sitting.  N/t more prevalent with sitting and laying down.  Walks for exercise.   Red flags:   denies BB changes and saddle anesthesia  Pain:  Are you having pain? Yes Pain location: L sided LBP and L LE pain NPRS scale:  Best: 0/10, Worst: 8/10 Aggravating factors: sitting for a long time, sleeping Relieving factors: standing walking Pain description: intermittent, sharp, and aching  Occupation: Retired  Education administrator: NA  Hand Dominance: NA  Patient Goals/Specific Activities: reduce pain, be able to play with grandchildren and participate in recreation    OBJECTIVE:   DIAGNOSTIC FINDINGS:  None recent in epic  GENERAL OBSERVATION/GAIT: Slow antalgic gait  SENSATION: Light touch: Deficits 5th digit of L foot  LUMBAR AROM  AROM AROM  (Eval)  Flexion Fingertips to toes, w/ concordant pain  Extension limited by 25%, w/ concordant pain  Right lateral flexion limited by 50%  Left lateral flexion limited by 50%  Right rotation limited by 50%, w/ concordant pain  Left rotation limited by 50%, w/ concordant pain    (Blank rows = not tested)   LE MMT:  MMT Right (Eval) Left (Eval)  Hip flexion (L2, L3) 4 4  Knee extension (L3) 4 4+  Knee flexion    Hip abduction    Hip extension    Hip external rotation  Hip internal rotation    Hip adduction    Ankle dorsiflexion (L4) c   Ankle plantarflexion (S1)    Ankle inversion    Ankle eversion    Great Toe ext (L5)    Grossly     (Blank rows = not tested, score listed is out of 5 possible points.  N = WNL, D = diminished, C = clear for gross weakness with myotome testing, * = concordant pain with testing)  SPECIAL TESTS:  Straight leg raise: L (+), R (-) Slump: L (+), R (-)  MUSCLE LENGTH: Hamstrings: Right no restriction; Left no restriction Hip flexors: Right significant restriction; Left significant restriction  LE ROM:  ROM Right (Eval) Left (Eval)  Hip flexion    Hip extension    Hip abduction    Hip adduction    Hip internal rotation    Hip external rotation    Knee flexion     Knee extension    Ankle dorsiflexion    Ankle plantarflexion    Ankle inversion    Ankle eversion      (Blank rows = not tested, N = WNL, * = concordant pain with testing)  Functional Tests  Eval    30'' STS: 10x  UE used? Y                                                           PATIENT SURVEYS:  ODI: 26/50  TODAY'S TREATMENT:                                                                                                                                   Therapeutic Exercise: Creating, reviewing, and completing below HEP  PATIENT EDUCATION (Molly Hanson/HM):  POC, diagnosis, prognosis, HEP, and outcome measures.  Pt educated via explanation, demonstration, and handout (HEP).  Pt confirms understanding verbally.   HOME EXERCISE PROGRAM: Access Code: [email or web address] URL: [email or web address] Date: 08/21/2024 Prepared by: Molly Hanson  Exercises - Supine Lower Trunk Rotation  - 1-2 x daily - 7 x weekly - 1 sets - 20 reps - 3 hold - Supine Single Knee to Chest Stretch  - 1-2 x daily - 7 x weekly - 1 sets - 3 reps - 45 hold - Supine Posterior Pelvic Tilt  - 1-2 x daily - 7 x weekly - 2 sets - 10 reps - 5-10 second hold hold - Seated Sciatic Tensioner  - 1-2 x daily - 7 x weekly - 20 reps  Treatment priorities   Eval        Hip flexor tightness        PPT        General core and hip strength        Look at  directional preference                  ASSESSMENT:  CLINICAL IMPRESSION: Molly Hanson is a 68 y.o. female who presents to clinic with signs and sxs consistent with phyician impression of chronic LBP with L sided radiculopathy.  Given no MOI and age, more of a stenotic type presentation.   Molly Hanson will benefit from skilled PT to address relevant deficits and improve comfort with daily tasks such as recreation and caring for grandchildren.   OBJECTIVE IMPAIRMENTS: Pain, lumbar ROM, hip and core strength, gait  ACTIVITY LIMITATIONS: bending, lifting,  walking, standing, sleeping  PERSONAL FACTORS: See medical history and pertinent history   REHAB POTENTIAL: Good  CLINICAL DECISION MAKING: Evolving/moderate complexity  EVALUATION COMPLEXITY: Moderate   GOALS:   SHORT TERM GOALS: Target date: 09/18/2024   Molly Hanson will be >75% HEP compliant to improve carryover between sessions and facilitate independent management of condition  Evaluation: ongoing Goal status: INITIAL   LONG TERM GOALS: Target date: 10/16/2024   Nakea will self report >/= 50% decrease in pain from evaluation to improve function in daily tasks  Evaluation/Baseline: 8/10 max pain Goal status: INITIAL   2.  Kissy will show a >/= 12 pt improvement in their ODI score (MCID is 12% or 6/50 pts) as a proxy for functional improvement   Evaluation/Baseline: 26/50 pts Goal status: INITIAL   3.  Cedric will be able to play with grandchildren, including getting up and down from floor, not limited by pain  Evaluation/Baseline: limited Goal status: INITIAL   4.  Emiliana will improve 30'' STS (MCID 2) to >/= 10x (w/ UE?: n) to show improved LE strength and improved transfers   Evaluation/Baseline: 10x  w/ UE? y Goal status: INITIAL   5.  Shamiya will be able to go to the gym and participate in classes or activities, possibly with modification, not limited by pain  Evaluation/Baseline: limited Goal status: INITIAL    PLAN: PT FREQUENCY: 1-2x/week  PT DURATION: 8 weeks  PLANNED INTERVENTIONS:  [id number]- PT Re-evaluation, 97110-Therapeutic exercises, 97530- Therapeutic activity, 97112- Neuromuscular re-education, 97535- Self Care, [id number]- Manual therapy, [phone]- Gait training, [phone]- Aquatic Therapy, [phone]- Traction (mechanical), [phone]- Ionotophoresis 4mg /ml Dexamethasone   Shawana Knoch PT, DPT 08/21/2024, 12:33 PM  Referring diagnosis? Lumbosacral radiculopathy [M54.17]  Treatment diagnosis? (if different than referring diagnosis)   Other low back  pain  Muscle weakness  Other abnormalities of gait and mobility  What was this (referring dx) caused by? []  Surgery []  Fall [x]  Ongoing issue []  Arthritis []  Other: ____________  Laterality: []  Rt [x]  Lt []  Both  Check all possible CPT codes:  *CHOOSE 10 OR LESS*    See Planned Interventions listed in the Plan section of the Evaluation.

## 2024-08-21 NOTE — ED Provider Triage Note (Signed)
 Emergency Medicine Provider Triage Evaluation Note  Molly Hanson , a 68 y.o. female  was evaluated in triage.  Pt complains of right lower quadrant abdominal pain.  Patient presents with pain over the past 4 days.  Worsening.  Not moving.  She continues to eat and drink.  She has had some constipation, tried a laxative without improvement.  Denies history of abdominal surgeries, but records show abdominal hysterectomy.  She may have a history of diverticulitis, does report eating some popcorn recently.  Review of Systems  Positive: Right lower quadrant abdominal pain Negative: Vomiting  Physical Exam  BP (!) 143/73   Pulse 73   Temp 98.5 F (36.9 C)   Resp 18   Ht 5' 6 (1.676 m)   Wt 99.8 kg   SpO2 95%   BMI 35.51 kg/m  Gen:   Awake, no distress   Resp:  Normal effort  MSK:   Moves extremities without difficulty  Other:  Tenderness to palpation right lower quadrant with guarding  Medical Decision Making  Medically screening exam initiated at 1:09 PM.  Appropriate orders placed.  EMINA RIBAUDO was informed that the remainder of the evaluation will be completed by another provider, this initial triage assessment does not replace that evaluation, and the importance of remaining in the ED until their evaluation is complete.  Labs and CT imaging ordered for further evaluation, appendicitis/diverticulitis, etc.   Desiderio Chew, PA-C 08/21/24 1311

## 2024-08-22 ENCOUNTER — Ambulatory Visit (INDEPENDENT_AMBULATORY_CARE_PROVIDER_SITE_OTHER): Admitting: Neurology

## 2024-08-22 ENCOUNTER — Encounter: Payer: Self-pay | Admitting: Neurology

## 2024-08-22 VITALS — BP 129/63 | HR 78 | Ht 66.0 in | Wt 229.0 lb

## 2024-08-22 DIAGNOSIS — E66812 Obesity, class 2: Secondary | ICD-10-CM | POA: Insufficient documentation

## 2024-08-22 DIAGNOSIS — R351 Nocturia: Secondary | ICD-10-CM | POA: Insufficient documentation

## 2024-08-22 DIAGNOSIS — Z6836 Body mass index (BMI) 36.0-36.9, adult: Secondary | ICD-10-CM

## 2024-08-22 DIAGNOSIS — G4726 Circadian rhythm sleep disorder, shift work type: Secondary | ICD-10-CM | POA: Insufficient documentation

## 2024-08-22 DIAGNOSIS — R0683 Snoring: Secondary | ICD-10-CM | POA: Diagnosis not present

## 2024-08-22 DIAGNOSIS — E6609 Other obesity due to excess calories: Secondary | ICD-10-CM

## 2024-08-22 DIAGNOSIS — Z87891 Personal history of nicotine dependence: Secondary | ICD-10-CM | POA: Insufficient documentation

## 2024-08-22 DIAGNOSIS — G4719 Other hypersomnia: Secondary | ICD-10-CM | POA: Insufficient documentation

## 2024-08-22 MED ORDER — ALPRAZOLAM 0.5 MG PO TABS: 0.5000 mg | ORAL_TABLET | Freq: Every evening | ORAL | 0 refills | Status: AC | PRN

## 2024-08-22 NOTE — Progress Notes (Signed)
 @[facility]   Provider:  Dedra Gores, MD  Primary Care Physician:  Lorren Greig PARAS, NP [address] [place] [address]    Referring Provider: Heilingoetter, Calton CROME, Pa-c [address] [place],  [place] [address]        Chief Concern for this Consultation:   Patient presents with          HPI: I have the pleasure of meeting with Molly Hanson on 08-22-2024, who is a 68 y.o. female patient,  seen upon a referral by  PA Heilingoetter  for a Sleep Medicine Consultation.  The patient's referral information asked for a. Evaluation of excessive daytime sleepiness.  The patient has a Corporate investment banker at [employer] :  I had recently wok up for leg pain, tingling  numbness.  I can not rest on my sides or my left leg gets numb. Dr Tobie said it wasn't neuropathy  here was a summary of Dr Cyndia note ; 08-01-2024: Starting in early 2025, she began having numbness involving the lateral left leg and foot.  Symptoms worse if she is laying on her side and improved when laying on her back.  She also has low back pain on the left side, described as achy. She does not have shooting pain down the legs.      Chief concern according to patient:   I am very sleepy  once I sit down, I will sleep  I fall asleep watching Tv, reading, not in conversation.  She estimated this started over 12 months ago.   presented with a medical history of : Past Medical History:  Diagnosis Date   Arthritis, osteo   Lumbal sacral radiculopathy left 5- S 1    knees, arm    Hyperlipidemia    has been elevated in the past - no meds    Vitamin D  deficiency Recent ED visit for abdominal pain.   Tubal ligation.        ENT  problems: (Sinusitis,  bronchitis, sore throat),  high cholesterol, GERD,  Hiatal Hernia , erythrocythemia  not Anemia,  night sweats.   The patient had no previous sleep evaluations.   Family medical history: There is an aunt ( maternal ) and Maternal GM  affected by  Sleep apnea but not by excessive daytime sleepiness.     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 She  lives in a private home, in a household alone and has no pets. Family status is single , with adult son and 2 grandchildren.  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  .  Nicotine use: quit 4 years ago .  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  ,  Caffeine intake in form of: Coffee (1 a week), Soft drinks (1 a week), Tea ( green tea) -  no Energy drinks ( including those containing  taurine ). Caffeine is last consumed at morning.  Exercises regularly in form of walking and [employer] .     Sleep habits and routines are as follows: The patient's dinner time is around 5-6 PM. sha Evening time is spent by watching TV. The patient goes to bed at, or close to, 10-11 PM. The bedroom is cool, quiet, and dark. The patient reports that it takes 30 minutes to fall asleep, then continues to sleep for 5-6 hours, interrupted or woken up by he need to void (Nocturia).  She goes to  urinate every 30 minutes (?)   The preferred sleep position is supine , with support of 1-2 pillows, (non- adjustable bed/ recliner ). The total estimated sleep time is circa 5-6 hours.  Dreams are reportedly rare/  Dream enactment has not been reported.   6 AM is the usual week- day rise time. The patient wakes up spontaneously. She  reports not feeling refreshed and restored in the morning, waking with symptoms such as dry mouth, rare morning headaches, dizziness, stiffness or pain, and fatigue.  No sleep paralysis has been experienced.   Naps in daytime are taken frequently (there is a desire to nap and opportunity), lasting from 30 to 60 and have a refreshing quality. These do not interfere with nocturnal sleep.     Review of Systems: Out of a complete 14 system review, the patient complains of only the following symptoms, and all other  reviewed systems are negative.:  Hypersomnia yes, excessive daytime sleepiness    Pain: low back, left leg , not  Headaches     Snoring, Sleep fragmentation,  Nocturia 4-6 times    How likely are you to doze in the following situations: 0 = not likely, 1 = slight chance, 2 = moderate chance, 3 = high chance Sitting and Reading? Watching Television? Sitting inactive in a public place (theater or meeting)? As a passenger in a car for an hour without a break? Lying down in the afternoon when circumstances permit? Sitting and talking to someone? Sitting quietly after lunch without alcohol? In a car, while stopped for a few minutes in traffic?   Total ESS =19 / 24 points.    FSS endorsed at 32/ 63 points.  GDS: 1/ 15   Social History   Socioeconomic History   Marital status: Single    Spouse name: Not on file   Number of children: Not on file   Years of education: Not on file   Highest education level: Associate degree: academic program  Occupational History   Not on file  Tobacco Use   Smoking status: Former    Current packs/day: 0.00    Types: Cigarettes    Quit date: 07/15/2020    Years since quitting: 4.1    Passive exposure: Past   Smokeless tobacco: Never  Vaping Use   Vaping status: Never Used  Substance and Sexual Activity   Alcohol use: Not Currently    Comment: Occassionally.   Drug use: Never   Sexual activity: Not Currently  Other Topics Concern   Not on file  Social History Narrative   Are you right handed or left handed? Right Handed    Are you currently employed ? No    What is your current occupation? Retired   Do you live at home alone? Yes   Who lives with you? No one    What type of home do you live in: 1 story or 2 story? Two story home       Social Drivers of Health   Financial Resource Strain: Medium Risk (04/29/2024)   Overall Financial Resource Strain (CARDIA)    Difficulty of Paying Living Expenses: Somewhat hard  Food Insecurity: Food  Insecurity Present (06/23/2024)   Hunger Vital Sign    Worried About Running Out of Food in the Last Year: Sometimes true    Ran Out of Food in the Last Year: Sometimes true  Transportation Needs: No Transportation Needs (06/23/2024)   PRAPARE - Transportation    Lack  of Transportation (Medical): No    Lack of Transportation (Non-Medical): No  Physical Activity: Insufficiently Active (04/29/2024)   Exercise Vital Sign    Days of Exercise per Week: 3 days    Minutes of Exercise per Session: 30 min  Stress: No Stress Concern Present (04/29/2024)   [name] - Occupational Stress Questionnaire    Feeling of Stress : Only a little  Recent Concern: Stress - Stress Concern Present (04/09/2024)   [name] - Occupational Stress Questionnaire    Feeling of Stress : To some extent  Social Connections: Moderately Integrated (04/29/2024)   Social Connection and Isolation Panel    Frequency of Communication with Friends and Family: More than three times a week    Frequency of Social Gatherings with Friends and Family: Once a week    Attends Religious Services: More than 4 times per year    Active Member of [employer] or Organizations: Yes    Attends Engineer, structural: More than 4 times per year    Marital Status: Divorced    Family History  Problem Relation Age of Onset   Breast cancer Mother    Diabetes Mother    Diabetes Father    Diabetes Maternal Grandmother    Stroke Maternal Grandmother    Colon polyps Maternal Grandmother    Colon cancer Maternal Grandfather    Diabetes Paternal Grandmother    Colon polyps Maternal Uncle    Esophageal cancer Neg Hx    Rectal cancer Neg Hx    Stomach cancer Neg Hx     Past Medical History:  Diagnosis Date   Arthritis    knees, arm    Hyperlipidemia    has been elevated in the past - no meds    Vitamin D  deficiency     Past Surgical History:  Procedure Laterality Date    ABDOMINAL HYSTERECTOMY     COLONOSCOPY     > 10 yrs ago- normal      Current Outpatient Medications on File Prior to Visit  Medication Sig Dispense Refill   albuterol  (VENTOLIN  HFA) 108 (90 Base) MCG/ACT inhaler Inhale 2 puffs into the lungs every 6 (six) hours as needed for wheezing or shortness of breath. 8 g 2   atorvastatin  (LIPITOR) 20 MG tablet Take 1 tablet by mouth once daily 90 tablet 0   clotrimazole -betamethasone  (LOTRISONE ) cream Apply 1 Application topically daily. 45 g 2   gabapentin  (NEURONTIN ) 300 MG capsule Take 1 capsule (300 mg total) by mouth daily as needed. (Patient not taking: Reported on 06/23/2024) 90 capsule 0   meclizine  (ANTIVERT ) 12.5 MG tablet Take 1 tablet (12.5 mg total) by mouth 3 (three) times daily as needed for dizziness. (Patient not taking: Reported on 08/01/2024) 30 tablet 1   methocarbamol  (ROBAXIN ) 500 MG tablet Take 1 tablet (500 mg total) by mouth 2 (two) times daily. (Patient not taking: Reported on 08/01/2024) 20 tablet 0   omeprazole  (PRILOSEC) 20 MG capsule Take 1 capsule (20 mg total) by mouth daily. (Patient not taking: Reported on 06/23/2024) 90 capsule 0   polyethylene glycol powder (GLYCOLAX/MIRALAX) 17 GM/SCOOP powder Take 17 g by mouth in the morning, at noon, and at bedtime. One scoop in beverage of your choice with each meal. You may increase if you continue to be constipated and decrease if your stool becomes too loose. 255 g 0   prednisoLONE acetate (PRED FORTE) 1 % ophthalmic suspension Place 1 drop into the  right eye in the morning and at bedtime.     simethicone (MYLICON) 80 MG chewable tablet Chew 1 tablet (80 mg total) by mouth every 6 (six) hours as needed for flatulence. 30 tablet 0   No current facility-administered medications on file prior to visit.     I reviewed recent very recent lab results from Mrs. Retter's testing she underwent on 07/18/2024 a battery of tests and her ANA returned positive.  The C-reactive protein was elevated  another inflammation marker, her LDH was 180, her ferritin level was very high and this can be artificially caused by inflammation.  It does not necessarily reflect an iron metabolism abnormality.  Ferritin was 582 2 months ago and is now 421.  Her red blood cell counts were normal limit 5.02 her white blood cell count was 7.4 K hemoglobin 14.3, hematocrit 43.9, she already underwent testing for hereditary factors of hemochromatosis these were negative.  Just yesterday she also underwent a comprehensive metabolic panel when seen in the emergency room glucose was high but it was a nonfasting sample she had eaten and so this is not necessarily a reflection of diabetes.  Calcium  was low 8.8 creatinine was normal BUN was normal liver enzymes were normal lipase was 37 no indication of a pancreatic inflammation, seems to have had been normal  urinary test.    Vitals:   08/22/24 1240  BP: 129/63  Pulse: 78      Physical exam:   General: The patient was alert and appears not in acute distress.  Mood and affect are appropriate .  The patient's interactions are: Cooperative, makes eye contact, follows the instructions and answers questions coherently.  The patient is groomed and appropriately groomed and dressed. Head: Normocephalic, atraumatic.  Neck is supple. Mallampati: 4.  The neck circumference measured 16 inches. Nasal airflow was patent ,   Overbite / Retrognathia was noted.  Dental status:  biological  Cardiovascular:  Regular rate and cardiac rhythm by palpable pulse. Respiratory: no audible wheezing, no tachypnoea.   Skin:  With evidence of ankle edema. No discoloration.  Trunk:  BMI is 37. The patient's posture was erect.   Neurologic exam : The patient was awake and alert, oriented to place and time.   Attention span & concentration ability appeared normal.  Speech was fluent, without dysarthria, dysphonia or aphasia, and of normal volume.     Cranial nerves:  There was no  loss of smell or taste reported  Pupils are round, equal in size and reactive to light.  Left eye cataract, right side with lens implant  3 mm pupils,  Funduscopic exam was deferred.  Extraocular movements in vertical and horizontal planes were intact and without nystagmus. (No Diplopia reported). Visual fields by finger perimetry are intact. Hearing was intact to soft voice.    Facial sensation intact to fine touch.  Facial motor strength: Symmetric movement and tongue and uvula move midline.  Neck ROM: rotation, tilt and flexion extension were intact for age and shoulder shrug was symmetrical.    Motor exam:  Symmetric bulk, strength and ROM.   Normal tone without cog- wheeling, and symmetric grip strength.   Sensory:  Vibration  intact.  Proprioception tested in the upper extremities was normal.   Coordination: The patient reported no problems with button closure and no changes to penmanship.   The Finger-to-nose maneuver was intact without evidence of ataxia, dysmetria or tremor.   Gait and station: Patient could rise unassisted from a seated  position, without bracing, and walked without assistive device.  Stance was of normal/ wide width.  The patient turned with 3 steps.  Pain in the left leg and foot.  Lower right abdominal  pain , limp was noted.  Arm swing was preserved. The patient's gait posture was stooped.   Deep tendon reflexes: Upper extremities did show symmetric DTRs. Lower extremity DTRs were symmetric and brisk/ attenuated.      I would like to thank Lorren Greig PARAS, NP and Heilingoetter, Calton CROME, Pa-c [address] [place],  [place] [address] for allowing me to meet with her patient.     In short, Mrs Bove  is presenting with excessive daytime sleepiness .  She reports daytime sleepiness , not as much fatigue,   she reports chronic pain, left leg and most recent abdominal pain.  Nocturia up to 6 times at night .  Back pain- radiculopathic.   She  has an abnormal ANA test, high ferritin and high Cr protein.   Autoimmune diseases often cause fatigue and diffuse pain.   Risk factors for OSA were present,  including : Body mass index is 36.96 kg/m., neck size and upper airway anatomy. She snores.      My Plan is to proceed with:   I will evaluate this patient for organic sleep disorders, and a sleep test in the sleep lab would be much preferred for obvious reasons.  This patient has lower extremity edema, she is excessively daytime sleepy she knows that she snores, but she does not have narcolepsy associated symptoms such as very vivid dreams or hypnagogic hallucinations, sleep paralysis, cataplexy.  She describes a classic pattern of falling asleep very easily when not stimulated or been not physically active.  I cannot rule out that she is simply sleep deprived due to pain and nocturia if she truly has to void every hour or so at night her sleep is probably 20% less then the total sleep time she estimated.  Should [employer] not cover an in-lab sleep test we will go with a home sleep test instead.  I did urged the patient to seek a rheumatological consultation and I will also copy Dr. Nina in this letter.   HST/ PSG     I plan to follow up personally prn within the next 4-6 .months.   A total time of  60  minutes consistent of a part of face to face encounter , exam and interview,  and additional preparation time for chart review was spent .  At today's visit, we discussed treatment options, associated risk and benefits, and engage in counseling as needed including, but not limited to:  Sleep hygiene, Quality Sleep Habits, and Safety concerns for patients with daytime sleepiness who are warned to not operate machinery/ motor vehicles when drowsy. Risk factors for sleep apnea were identified:  Additionally, the following were reviewed: Past medical records, past medical and surgical history, family and social background, as well  as relevant laboratory results, imaging findings, and medical notes, where applicable.  This note was generated by myself in part by using dictation software, and as a result, it may contain unintentional typos and errors.  Nevertheless, effort was made to accurately convey the pertinent aspects of the patient's visit.   Dedra Gores, MD  [facility] and [facility] Sleep Board certified in Sleep Medicine by The [employer] of Sleep Medicine and Diplomate of the [employer] of [employer] (AASM) . Board certified In Neurology, Diplomat of  the ABPN,  Fellow of the [employer] of Neu[employer].

## 2024-08-22 NOTE — Patient Instructions (Signed)
 Hypersomnia Hypersomnia is a condition in which a person feels very tired during the day even though the person gets plenty of sleep at night. A person with this condition may take naps during the day and may find it very difficult to wake up from sleep. Hypersomnia may affect a person's ability to think, concentrate, drive, or remember things. What are the causes? The cause of this condition may not be known. Possible causes include: Taking certain medicines. Using drugs or alcohol. Sleep disorders, such as narcolepsy and sleep apnea. Injury to the head, brain, or spinal cord. Tumors. Certain medical conditions. These include: Depression. Diabetes. Gastroesophageal reflux disease (GERD). An underactive thyroid  gland (hypothyroidism). What are the signs or symptoms? The main symptoms of hypersomnia include: Feeling very tired throughout the day, regardless of how much sleep you got the night before. Having trouble waking up. Others may find it difficult to wake you up when you are sleeping. Sleeping for longer and longer periods at a time. Taking naps throughout the day. Other symptoms may include: Feeling restless, anxious, or annoyed. Lacking energy. Having trouble with: Remembering. Speaking. Thinking. Loss of appetite. Seeing, hearing, tasting, smelling, or feeling things that are not real (hallucinations). How is this diagnosed? This condition may be diagnosed based on: Your symptoms and medical history. Your sleeping habits. Your health care provider may ask you to write down your sleeping habits in a daily sleep log, along with any symptoms you have. A series of tests that are done while you sleep (sleep study or polysomnogram). A test that measures how quickly you can fall asleep during the day (daytime nap study or multiple sleep latency test). How is this treated? This condition may be treated by: Following a regular sleep routine. Making lifestyle changes, such as  changing your eating habits, getting regular exercise, and avoiding alcohol or caffeinated beverages. Taking medicines to make you more alert (stimulants) during the day. Treating any underlying medical causes of hypersomnia. Follow these instructions at home: Sleep habits Stick to a routine that includes going to bed and waking up at the same times every day and night. Practice a relaxing bedtime routine. This may include reading, meditation, deep breathing, or taking a warm bath before going to sleep. Exercise regularly as told by your health care provider. However, avoid exercising in the hours right before bedtime. Keep your sleep environment at a cooler temperature, darkened, and quiet. Sleep with pillows and a mattress that are comfortable and supportive. Schedule short 20-minute naps for when you feel sleepiest during the day. Talk with your employer or teachers about your hypersomnia. If possible, adjust your schedule so that: You have a regular daytime work schedule. You can take a scheduled nap during the day. You do not have to work or be active at night. Do not eat a heavy meal for a few hours before bedtime. Eat your meals at about the same times every day. Safety  Do not drive or use machinery if you are sleepy. Ask your health care provider if it is safe for you to drive. Wear a life jacket when swimming or spending time near water. General instructions  Take over-the-counter and prescription medicines only as told by your health care provider. This includes supplements. Avoid drinking alcohol or caffeinated beverages. Keep a sleep log that will help your health care provider manage your condition. This may include information about: What time you go to bed each night. How often you wake up at night. How many hours  you sleep at night. How often and for how long you nap during the day. Any observations from others, such as leg movements during sleep, sleep walking, or  snoring. Keep all follow-up visits. This is important. Contact a health care provider if: You have new symptoms. Your symptoms get worse. Get help right away if: You have thoughts about hurting yourself or someone else. Get help right away if you feel like you may hurt yourself or others, or have thoughts about taking your own life. Go to your nearest emergency room or: Call 911. Call the National Suicide Prevention Lifeline at 3031993578 or 988. This is open 24 hours a day. Text the Crisis Text Line at 2155871955. Summary Hypersomnia refers to a condition in which you feel very tired during the day even though you get plenty of sleep at night. A person with this condition may take naps during the day and may find it very difficult to wake up from sleep. Hypersomnia may affect a person's ability to think, concentrate, drive, or remember things. Treatment may include a regular sleep routine and making some lifestyle changes. This information is not intended to replace advice given to you by your health care provider. Make sure you discuss any questions you have with your health care provider. Document Revised: 10/20/2021 Document Reviewed: 10/20/2021 Elsevier Patient Education  2024 [employer] Inc.  Living With Sleep Apnea Sleep apnea is a condition that affects your breathing while you're sleeping. Your tongue or the tissue in your throat may block the flow of air while you sleep. You may have shallow breathing or stop breathing for short periods of time. The breaks in breathing interrupt the deep sleep that you need to feel rested. Even if you don't wake up from the gaps in breathing, you may feel tired during the day. People with sleep apnea may snore loudly. You may have a headache in the morning and feel anxious or depressed. How can sleep apnea affect me? Sleep apnea increases your chances of being very tired during the day. This is called daytime fatigue. Sleep apnea can also increase  your risk of: Heart attack. Stroke. Obesity. Type 2 diabetes. Heart failure. Irregular heartbeat. High blood pressure. If you are very tired during the day, you may be more likely to: Not do well in school or at work. Fall asleep while driving. Have trouble paying attention. Develop depression or anxiety. Have problems having sex. This is called sexual dysfunction. What actions can I take to manage sleep apnea? Sleep apnea treatment  If you were given a device to open your airway while you sleep, use it only as told by your health care provider. You may be given: An oral appliance. This is a mouthpiece that shifts your lower jaw forward. A continuous positive airway pressure (CPAP) device. This blows air through a mask. A nasal expiratory positive airway pressure (EPAP) device. This has valves that you put into each nostril. A bi-level positive airway pressure (BIPAP) device. This blows air through a mask when you breathe in and breathe out. You may need surgery if other treatments don't work for you. Sleep habits Go to sleep and wake up at the same time every day. This helps set your internal clock for sleeping. If you stay up later than usual on weekends, try to get up in the morning within 2 hours of the time you usually wake up. Try to get at least 7-9 hours of sleep each night. Stop using a computer, tablet, and  mobile phone a few hours before bedtime. Do not take long naps during the day. If you nap, limit it to 30 minutes. Have a relaxing bedtime routine. Reading or listening to music may relax you and help you sleep. Use your bedroom only for sleep. Keep your television and computer out of your bedroom. Keep your bedroom cool, dark, and quiet. Use a supportive mattress and pillows. Follow your provider's instructions for other changes to sleep habits. Nutrition Do not eat big meals in the evening. Do not have caffeine in the later part of the day. The effects of caffeine  can last for more than 5 hours. Follow your provider's instructions for any changes to what you eat and drink. Lifestyle Do not drink alcohol before bedtime. Alcohol can cause you to fall asleep at first, but then it can cause you to wake up in the middle of the night and have trouble getting back to sleep. Do not smoke, vape, or use nicotine or tobacco. Medicines Take over-the-counter and prescription medicines only as told by your provider. Do not use over-the-counter sleep medicine. You may become dependent on this medicine, and it can make sleep apnea worse. Do not take medicines, such as sedatives and narcotics, unless told to by your provider. Activity Exercise on most days, but avoid exercising in the evening. Exercising near bedtime can interfere with sleeping. If possible, spend time outside every day. Natural light helps with your internal clock. General information Lose weight if you need to. Stay at a healthy weight. If you are having surgery, make sure to tell your provider that you have sleep apnea. You may need to bring your device with you. Keep all follow-up visits. Your provider will want to check on your condition. Where to find more information National Heart, Lung, and Blood Institute: [employer]DryCleaner.gl This information is not intended to replace advice given to you by your health care provider. Make sure you discuss any questions you have with your health care provider. Document Revised: 03/03/2023 Document Reviewed: 03/03/2023 Elsevier Patient Education  2024 Elsevier Inc.  Quality Sleep Information, Adult Quality sleep is important for your mental and physical health. It also improves your quality of life. Quality sleep means you: Are asleep for most of the time you are in bed. Fall asleep within 30 minutes. Wake up no more than once a night. Are awake for no longer than 20 minutes if you do wake up during the night. Most adults need 7-8 hours of quality sleep each  night. How can poor sleep affect me? If you do not get enough quality sleep, you may have: Mood swings. Daytime sleepiness. Decreased alertness, reaction time, and concentration. Sleep disorders, such as insomnia and sleep apnea. Difficulty with: Solving problems. Coping with stress. Paying attention. These issues may affect your performance and productivity at work, school, and home. Lack of sleep may also put you at higher risk for accidents, suicide, and risky behaviors. If you do not get quality sleep, you may also be at higher risk for several health problems, including: Infections. Type 2 diabetes. Heart disease. High blood pressure. Obesity. Worsening of long-term conditions, like arthritis, kidney disease, depression, Parkinson's disease, and epilepsy. What actions can I take to get more quality sleep? Sleep schedule and routine Stick to a sleep schedule. Go to sleep and wake up at about the same time each day. Do not try to sleep less on weekdays and make up for lost sleep on weekends. This does not work. Limit naps  during the day to 30 minutes or less. Do not take naps in the late afternoon. Make time to relax before bed. Reading, listening to music, or taking a hot bath promotes quality sleep. Make your bedroom a place that promotes quality sleep. Keep your bedroom dark, quiet, and at a comfortable room temperature. Make sure your bed is comfortable. Avoid using electronic devices that give off bright blue light for 30 minutes before bedtime. Your brain perceives bright blue light as sunlight. This includes television, phones, and computers. If you are lying awake in bed for longer than 20 minutes, get up and do a relaxing activity until you feel sleepy. Lifestyle     Try to get at least 30 minutes of exercise on most days. Do not exercise 2-3 hours before going to bed. Do not use any products that contain nicotine or tobacco. These products include cigarettes, chewing  tobacco, and vaping devices, such as e-cigarettes. If you need help quitting, ask your health care provider. Do not drink caffeinated beverages for at least 8 hours before going to bed. Coffee, tea, and some sodas contain caffeine. Do not drink alcohol or eat large meals close to bedtime. Try to get at least 30 minutes of sunlight every day. Morning sunlight is best. Medical concerns Work with your health care provider to treat medical conditions that may affect sleeping, such as: Nasal obstruction. Snoring. Sleep apnea and other sleep disorders. Talk to your health care provider if you think any of your prescription medicines may cause you to have difficulty falling or staying asleep. If you have sleep problems, talk with a sleep consultant. If you think you have a sleep disorder, talk with your health care provider about getting evaluated by a specialist. Where to find more information Sleep Foundation: sleepfoundation.org American Academy of Sleep Medicine: aasm.org Centers for Disease Control and Prevention (CDC): TonerPromos.no Contact a health care provider if: You have trouble getting to sleep or staying asleep. You often wake up very early in the morning and cannot get back to sleep. You have daytime sleepiness. You have daytime sleep attacks of suddenly falling asleep and sudden muscle weakness (narcolepsy). You have a tingling sensation in your legs with a strong urge to move your legs (restless legs syndrome). You stop breathing briefly during sleep (sleep apnea). You think you have a sleep disorder or are taking a medicine that is affecting your quality of sleep. Summary Most adults need 7-8 hours of quality sleep each night. Getting enough quality sleep is important for your mental and physical health. Make your bedroom a place that promotes quality sleep, and avoid things that may cause you to have poor sleep, such as alcohol, caffeine, smoking, or large meals. Talk to your health  care provider if you have trouble falling asleep or staying asleep. This information is not intended to replace advice given to you by your health care provider. Make sure you discuss any questions you have with your health care provider. Document Revised: 03/04/2022 Document Reviewed: 03/04/2022 Elsevier Patient Education  2024 [employer].

## 2024-08-23 ENCOUNTER — Telehealth: Payer: Self-pay | Admitting: Neurology

## 2024-08-23 NOTE — Telephone Encounter (Signed)
 [employer] pending Medicaid no auth req

## 2024-08-24 NOTE — Telephone Encounter (Signed)
 [employer] Molly Hanson: [id number] (exp. 101/25 to 11/20/24)

## 2024-08-25 ENCOUNTER — Ambulatory Visit: Payer: Self-pay

## 2024-08-25 ENCOUNTER — Encounter: Payer: Self-pay | Admitting: Family Medicine

## 2024-08-25 DIAGNOSIS — Z1231 Encounter for screening mammogram for malignant neoplasm of breast: Secondary | ICD-10-CM

## 2024-08-25 NOTE — Telephone Encounter (Signed)
 FYI Only or Action Required?: FYI only for provider.  Patient was last seen in primary care on 05/30/2024 by Lorren Greig PARAS, NP.  Called Nurse Triage reporting Abdominal Pain.  Symptoms began a week ago.  Interventions attempted: Rest, hydration, or home remedies.  Symptoms are: gradually worsening.  Triage Disposition: Go to ED Now (Notify PCP)  Patient/caregiver understands and will follow disposition?: No, refuses disposition  ** Refused ED; TOC was previously scheduled for 10/15. She stated she will tough it out until then. Can office contact patient for a sooner TOC appt.? CAL called x 2 no answer*     Copied from CRM #[id number]. Topic: Clinical - Red Word Triage >> Aug 25, 2024  9:53 AM Maisie BROCKS wrote: Red Word that prompted transfer to Nurse Triage: severe pain on lower right abdomen, classified pain a 10/10. Hard to walk. Reason for Disposition  [1] SEVERE pain (e.g., excruciating) AND [2] present > 1 hour  Answer Assessment - Initial Assessment Questions 1. LOCATION: Where does it hurt?      Lower right side   2. RADIATION: Does the pain shoot anywhere else? (e.g., chest, back)     No   3. ONSET: When did the pain begin? (e.g., minutes, hours or days ago)      X 1 week  4. SUDDEN: Gradual or sudden onset?     Sudden  5. PATTERN Does the pain come and go, or is it constant?     Intermittent   6. SEVERITY: How bad is the pain?  (e.g., Scale 1-10; mild, moderate, or severe)     10/10  7. RECURRENT SYMPTOM: Have you ever had this type of stomach pain before? If Yes, ask: When was the last time? and What happened that time?      Ongoing x 6 months intermittently   8. CAUSE: What do you think is causing the stomach pain? (e.g., gallstones, recent abdominal surgery)     Unsure   9. RELIEVING/AGGRAVATING FACTORS: What makes it better or worse? (e.g., antacids, bending or twisting motion, bowel movement)     Advil makes the pain better  temporarily   10. OTHER SYMPTOMS: Do you have any other symptoms? (e.g., back pain, diarrhea, fever, urination pain, vomiting)  No   Patient refusing ED dispo, as she stated she was seen in the ED a few days ago for similar symptoms. She is only wanting to be seen by provider in office. She has a TOC appt. Scheduled for 10/15. She stated she will tough it out until then. Can office contact patient for a sooner TOC appt.?  Protocols used: Abdominal Pain - Female-A-AH

## 2024-08-28 NOTE — Telephone Encounter (Signed)
 Patient need hospital follow up appt. Discharge date: 08/21/24. None available within 2 weeks

## 2024-08-28 NOTE — Telephone Encounter (Signed)
 No HFU appts available with any provider at [facility]. Pt is establishing care at another primary care office on 10/15

## 2024-08-30 ENCOUNTER — Ambulatory Visit: Admitting: Physical Therapy

## 2024-09-06 ENCOUNTER — Ambulatory Visit: Payer: Self-pay | Admitting: Family Medicine

## 2024-09-06 ENCOUNTER — Ambulatory Visit: Admitting: Family Medicine

## 2024-09-06 ENCOUNTER — Encounter: Payer: Self-pay | Admitting: Family Medicine

## 2024-09-06 ENCOUNTER — Ambulatory Visit: Admitting: Physical Therapy

## 2024-09-06 ENCOUNTER — Other Ambulatory Visit: Payer: Self-pay | Admitting: Family Medicine

## 2024-09-06 VITALS — BP 128/84 | HR 67 | Temp 97.6°F | Ht 66.0 in | Wt 228.0 lb

## 2024-09-06 DIAGNOSIS — L309 Dermatitis, unspecified: Secondary | ICD-10-CM

## 2024-09-06 DIAGNOSIS — L608 Other nail disorders: Secondary | ICD-10-CM

## 2024-09-06 DIAGNOSIS — L819 Disorder of pigmentation, unspecified: Secondary | ICD-10-CM

## 2024-09-06 DIAGNOSIS — R202 Paresthesia of skin: Secondary | ICD-10-CM

## 2024-09-06 DIAGNOSIS — N644 Mastodynia: Secondary | ICD-10-CM | POA: Diagnosis not present

## 2024-09-06 DIAGNOSIS — R200 Anesthesia of skin: Secondary | ICD-10-CM

## 2024-09-06 DIAGNOSIS — E785 Hyperlipidemia, unspecified: Secondary | ICD-10-CM

## 2024-09-06 DIAGNOSIS — E538 Deficiency of other specified B group vitamins: Secondary | ICD-10-CM

## 2024-09-06 DIAGNOSIS — Z87891 Personal history of nicotine dependence: Secondary | ICD-10-CM | POA: Diagnosis not present

## 2024-09-06 DIAGNOSIS — R1031 Right lower quadrant pain: Secondary | ICD-10-CM | POA: Diagnosis not present

## 2024-09-06 DIAGNOSIS — R61 Generalized hyperhidrosis: Secondary | ICD-10-CM

## 2024-09-06 DIAGNOSIS — E1169 Type 2 diabetes mellitus with other specified complication: Secondary | ICD-10-CM | POA: Diagnosis not present

## 2024-09-06 DIAGNOSIS — N750 Cyst of Bartholin's gland: Secondary | ICD-10-CM | POA: Diagnosis not present

## 2024-09-06 LAB — VITAMIN B12: Vitamin B-12: 393 pg/mL (ref 211–911)

## 2024-09-06 LAB — TSH: TSH: 1.24 u[IU]/mL (ref 0.35–5.50)

## 2024-09-06 LAB — HEMOGLOBIN A1C: Hgb A1c MFr Bld: 6.4 % (ref 4.6–6.5)

## 2024-09-06 LAB — T4, FREE: Free T4: 0.91 ng/dL (ref 0.60–1.60)

## 2024-09-06 NOTE — Patient Instructions (Addendum)
 Please go downstairs for labs before you leave  I referred you to gynecology and [facility] and they should reach out to schedule you  I ordered a diagnostic mammogram and you should receive a call to get this done  I will be in touch with your lab results

## 2024-09-06 NOTE — Progress Notes (Signed)
 New Patient Office Visit  Subjective    Patient ID: Molly Hanson, female    DOB: [birth year]  Age: 68 y.o. MRN: [id number]  CC:  Chief Complaint  Patient presents with   Establish Care    Left foot big toe is black, just noticed a couple days ago. Also brown spots on foot.   Right lower abdominal pain for 2 weeks, sharp pains. Happens if she moves the wrong way or walks.  -Bruise easily, fluid in knees, thinks she has rheumatoid arthritis, cramping in fingers, numbness in left legs and toes, wants her to look at labs    Discussed the use of AI scribe software for clinical note transcription with the patient, who gave verbal consent to proceed.  History of Present Illness Molly Hanson is a 68 year old female who presents with a new breast lump and abdominal pain.  Breast mass - New lump in the right breast - Lump is non-tender but causes discomfort with pressure - Last breast exam was in October of the previous year - Mammogram is scheduled for October 29th of this year - Seeking diagnostic mammogram due to new lump  Abdominal pain - Sharp pain localized to the right lower quadrant for approximately two weeks but hx of same with imaging done previously  - Pain worsens with movement - No changes in bowel habits, nausea, or vomiting - Bowel movements occur once daily or every other day with Metamucil use - Previous CT scan for abdominal pain showed no explanation  - No colonoscopy since 2022  Peripheral neuropathy symptoms - Numbness in toes and leg on left - Prior evaluation by a neurologist  - Referred to physical therapy for chronic low back pain but has not started due to current abdominal pain   Hematologic abnormalities - Elevated iron levels and polycythemia under management by hematology  Constitutional and genitourinary symptoms - Night sweats present - Frequent urination, attributed to increased hydration  Glucose intolerance - A1c most recently in  prediabetes range but does have a hx of diabetes  Tobacco use history - Quit smoking four years ago after a health scare - No current tobacco use  Hyperlipidemia - prescribed statin therapy and inquires whether she should start taking it      Outpatient Encounter Medications as of 09/06/2024  Medication Sig   albuterol  (VENTOLIN  HFA) 108 (90 Base) MCG/ACT inhaler Inhale 2 puffs into the lungs every 6 (six) hours as needed for wheezing or shortness of breath.   clotrimazole -betamethasone  (LOTRISONE ) cream Apply 1 Application topically daily.   ALPRAZolam (XANAX) 0.5 MG tablet Take 1 tablet (0.5 mg total) by mouth at bedtime as needed for sleep (premedication for the sleep test.). (Patient not taking: Reported on 09/06/2024)   atorvastatin  (LIPITOR) 20 MG tablet Take 1 tablet by mouth once daily (Patient not taking: Reported on 09/06/2024)   [DISCONTINUED] meclizine  (ANTIVERT ) 12.5 MG tablet Take 1 tablet (12.5 mg total) by mouth 3 (three) times daily as needed for dizziness. (Patient not taking: Reported on 08/22/2024)   [DISCONTINUED] prednisoLONE acetate (PRED FORTE) 1 % ophthalmic suspension Place 1 drop into the right eye in the morning and at bedtime.   [DISCONTINUED] simethicone (MYLICON) 80 MG chewable tablet Chew 1 tablet (80 mg total) by mouth every 6 (six) hours as needed for flatulence.   No facility-administered encounter medications on file as of 09/06/2024.    Past Medical History:  Diagnosis Date   Arthritis    knees, arm  Hyperlipidemia    has been elevated in the past - no meds    Referred otalgia, right 05/01/2024   Vitamin D  deficiency     Past Surgical History:  Procedure Laterality Date   COLONOSCOPY     > 10 yrs ago- normal     Family History  Problem Relation Age of Onset   Breast cancer Mother    Diabetes Mother    Diabetes Father    Diabetes Maternal Grandmother    Stroke Maternal Grandmother    Colon polyps Maternal Grandmother    Colon  cancer Maternal Grandfather    Diabetes Paternal Grandmother    Colon polyps Maternal Uncle    Esophageal cancer Neg Hx    Rectal cancer Neg Hx    Stomach cancer Neg Hx     Social History   Socioeconomic History   Marital status: Single    Spouse name: Not on file   Number of children: Not on file   Years of education: Not on file   Highest education level: Associate degree: academic program  Occupational History   Not on file  Tobacco Use   Smoking status: Former    Current packs/day: 0.00    Types: Cigarettes    Quit date: 07/15/2020    Years since quitting: 4.1    Passive exposure: Past   Smokeless tobacco: Never  Vaping Use   Vaping status: Never Used  Substance and Sexual Activity   Alcohol use: Not Currently    Comment: Occassionally.   Drug use: Never   Sexual activity: Not Currently  Other Topics Concern   Not on file  Social History Narrative   Are you right handed or left handed? Right Handed    Are you currently employed ? No    What is your current occupation? Retired   Do you live at home alone? Yes   Who lives with you? No one    What type of home do you live in: 1 story or 2 story? Two story home       Social Drivers of Health   Financial Resource Strain: Low Risk  (09/05/2024)   Overall Financial Resource Strain (CARDIA)    Difficulty of Paying Living Expenses: Not very hard  Food Insecurity: Food Insecurity Present (09/05/2024)   Hunger Vital Sign    Worried About Running Out of Food in the Last Year: Sometimes true    Ran Out of Food in the Last Year: Sometimes true  Transportation Needs: No Transportation Needs (09/05/2024)   PRAPARE - Administrator, Civil Service (Medical): No    Lack of Transportation (Non-Medical): No  Physical Activity: Insufficiently Active (09/05/2024)   Exercise Vital Sign    Days of Exercise per Week: 1 day    Minutes of Exercise per Session: 30 min  Stress: No Stress Concern Present (09/05/2024)    [name] - Occupational Stress Questionnaire    Feeling of Stress: Not at all  Social Connections: Moderately Integrated (09/05/2024)   Social Connection and Isolation Panel    Frequency of Communication with Friends and Family: Twice a week    Frequency of Social Gatherings with Friends and Family: Once a week    Attends Religious Services: More than 4 times per year    Active Member of [employer] or Organizations: Yes    Attends Engineer, structural: More than 4 times per year    Marital Status: Divorced  Intimate Partner Violence: Not  At Risk (06/23/2024)   Humiliation, Afraid, Rape, and Kick questionnaire    Fear of Current or Ex-Partner: No    Emotionally Abused: No    Physically Abused: No    Sexually Abused: No    ROS Per HPI     Objective    BP 128/84   Pulse 67   Temp 97.6 F (36.4 C) (Temporal)   Ht 5' 6 (1.676 m)   Wt 228 lb (103.4 kg)   SpO2 99%   BMI 36.80 kg/m   Physical Exam Exam conducted with a chaperone present.  Constitutional:      General: She is not in acute distress.    Appearance: She is obese. She is not ill-appearing.  HENT:     Mouth/Throat:     Mouth: Mucous membranes are moist.     Pharynx: Oropharynx is clear.  Eyes:     Extraocular Movements: Extraocular movements intact.     Conjunctiva/sclera: Conjunctivae normal.  Cardiovascular:     Rate and Rhythm: Normal rate and regular rhythm.  Pulmonary:     Effort: Pulmonary effort is normal.     Breath sounds: Normal breath sounds.  Chest:  Breasts:    Right: Tenderness present.     Comments: Area of tenderness and linear mass at 9 o'clock position  Abdominal:     General: Bowel sounds are normal. There is no distension.     Palpations: Abdomen is soft.     Tenderness: There is no abdominal tenderness. There is no guarding or rebound. Negative signs include Murphy's sign, McBurney's sign and psoas sign.  Musculoskeletal:     Cervical back: Normal  range of motion and neck supple. No tenderness.     Right lower leg: No tenderness. No edema.     Left lower leg: No tenderness. No edema.     Right foot: Normal range of motion and normal capillary refill. No swelling. Normal pulse.     Left foot: Normal range of motion and normal capillary refill. No swelling. Normal pulse.     Comments: Left great toenail with thickening and black   Lymphadenopathy:     Cervical: No cervical adenopathy.  Skin:    General: Skin is warm and dry.  Neurological:     General: No focal deficit present.     Mental Status: She is alert and oriented to person, place, and time.     Motor: No weakness.     Coordination: Coordination normal.     Gait: Gait normal.  Psychiatric:        Mood and Affect: Mood normal.        Behavior: Behavior normal.        Thought Content: Thought content normal.         Assessment & Plan:   Problem List Items Addressed This Visit     Bartholin gland cyst   Relevant Orders   Ambulatory referral to Gynecology   Former smoker   Other Visit Diagnoses       Right lower quadrant abdominal pain    -  Primary   Relevant Orders   Ambulatory referral to Gynecology     Toenail deformity       Relevant Orders   Ambulatory referral to Podiatry     Night sweats       Relevant Orders   Ambulatory referral to Gynecology     Numbness and tingling of left leg       Relevant Orders  Vitamin B12   TSH   T4, free     Breast pain, right       Relevant Orders   MM Digital Diagnostic Bilat     Type 2 diabetes mellitus with morbid obesity (HCC)       Relevant Orders   Hemoglobin A1c   TSH   T4, free     Hyperlipidemia associated with type 2 diabetes mellitus (HCC)         B12 deficiency       Relevant Orders   Vitamin B12       Assessment and Plan Assessment & Plan Right breast lump with fibrocystic change New right breast lump with characteristics suggestive of fibrocystic change. The lump is a band of  fibrocystic area, not a single mass. - Order diagnostic mammogram to evaluate the right breast lump.  Right lower quadrant abdominal pain Intermittent pain for two weeks, exacerbated by movement. Previous CT was unremarkable. Pain suggests possible musculoskeletal origin, but gynecological causes need to be ruled out. - Refer to gynecology for further evaluation.  Numbness of toes and left leg  Neurology is working up the condition. Physical therapy postponed due to abdominal pain. - Continue neurology follow-up. - Discuss potential neuropathic pain medication with neurology if symptoms worsen.  Chronic low back pain Chronic low back pain with associated left leg numbness. Neurology involved in management. Physical therapy recommended but not started due to abdominal pain. - per neurology notes, they will consider MRI of the low back if no improvement after physical therapy.  Night sweats Possibly related to postmenopausal symptoms. Further evaluation by gynecology is warranted to assess hormonal status and other potential causes. - Refer to gynecology for evaluation.  Toenail deformity - referral to podiatry       No follow-ups on file.   Boby Mackintosh, NP-C

## 2024-09-07 ENCOUNTER — Encounter: Payer: Self-pay | Admitting: Podiatry

## 2024-09-07 ENCOUNTER — Ambulatory Visit: Admitting: Podiatry

## 2024-09-07 DIAGNOSIS — L600 Ingrowing nail: Secondary | ICD-10-CM

## 2024-09-07 DIAGNOSIS — L309 Dermatitis, unspecified: Secondary | ICD-10-CM | POA: Diagnosis not present

## 2024-09-08 ENCOUNTER — Encounter: Payer: Self-pay | Admitting: Family Medicine

## 2024-09-08 DIAGNOSIS — M25561 Pain in right knee: Secondary | ICD-10-CM

## 2024-09-08 NOTE — Addendum Note (Signed)
 Addended by: Ashlynd Michna E on: 09/08/2024 10:42 AM   Modules accepted: Orders

## 2024-09-08 NOTE — Progress Notes (Signed)
 Subjective:   Patient ID: Molly Hanson, female   DOB: 68 y.o.   MRN: [id number]   HPI Patient presents with numbness in both feet that is not truly bothersome but is present with discolored nails of the big toenails with dark discoloration of recent duration and spots on her feet and her hands that she is not sure about that do not bother her   ROS      Objective:  Physical Exam  Neurovascular status intact mild numbness bilateral but not progressing with traumatized hallux nails bilateral that are discolored with probability of some bleeding of tissue and spots that I am just not sure about origin but they do not bother     Assessment:  Possibility for some form of dermatitis or allergic reaction that is more systemic and a structural numbness which I do not think is anything of consequence     Plan:  H&P reviewed all conditions and I have recommended she see dermatologist since the spots are systemic and she will make appointment.  Hopefully they will go away over the next couple months numbness and nails will have to regrow and hopefully do well for

## 2024-09-11 ENCOUNTER — Encounter: Admitting: Physical Therapy

## 2024-09-11 ENCOUNTER — Ambulatory Visit: Admitting: Podiatry

## 2024-09-11 NOTE — Telephone Encounter (Signed)
 09/11/24 LVM EE  08/30/24 line not in service [place]

## 2024-09-12 NOTE — Telephone Encounter (Signed)
 X2 LVM and sent mychart

## 2024-09-13 NOTE — Telephone Encounter (Signed)
 NPSG Molly Hanson: [phone] (exp. 08/23/24 to 11/20/24) & medicaid no auth req   Patient is scheduled at [facility] for 10/08/24 at 9 pm.  Mailed packet and sent mychart

## 2024-09-13 NOTE — Telephone Encounter (Signed)
 Should we refer to ortho or sports med?

## 2024-09-13 NOTE — Addendum Note (Signed)
 Addended by: Montie Gelardi E on: 09/13/2024 01:34 PM   Modules accepted: Orders

## 2024-09-14 ENCOUNTER — Telehealth (HOSPITAL_BASED_OUTPATIENT_CLINIC_OR_DEPARTMENT_OTHER): Payer: Self-pay

## 2024-09-14 NOTE — Telephone Encounter (Signed)
 Please schedule patient to see Dr.Miller.

## 2024-09-14 NOTE — Telephone Encounter (Signed)
 This pt is a referral and she is calling wanting an appointment. When you have a chance can you look over her chart and see who you think I can put her with. RLQ pain, bartholin gland cyst and night sweats.

## 2024-09-18 ENCOUNTER — Other Ambulatory Visit: Payer: Self-pay

## 2024-09-18 ENCOUNTER — Ambulatory Visit: Admitting: Physician Assistant

## 2024-09-18 ENCOUNTER — Encounter: Payer: Self-pay | Admitting: Physician Assistant

## 2024-09-18 ENCOUNTER — Encounter: Admitting: Physical Therapy

## 2024-09-18 DIAGNOSIS — G8929 Other chronic pain: Secondary | ICD-10-CM | POA: Diagnosis not present

## 2024-09-18 DIAGNOSIS — M1711 Unilateral primary osteoarthritis, right knee: Secondary | ICD-10-CM

## 2024-09-18 DIAGNOSIS — M25562 Pain in left knee: Secondary | ICD-10-CM

## 2024-09-18 DIAGNOSIS — M170 Bilateral primary osteoarthritis of knee: Secondary | ICD-10-CM

## 2024-09-18 DIAGNOSIS — M25561 Pain in right knee: Secondary | ICD-10-CM

## 2024-09-18 DIAGNOSIS — M1712 Unilateral primary osteoarthritis, left knee: Secondary | ICD-10-CM

## 2024-09-18 MED ORDER — METHYLPREDNISOLONE ACETATE 40 MG/ML IJ SUSP
40.0000 mg | INTRAMUSCULAR | Status: AC | PRN
  Administered 2024-09-18: 40 mg via INTRA_ARTICULAR

## 2024-09-18 MED ORDER — LIDOCAINE HCL (PF) 1 % IJ SOLN
5.0000 mL | INTRAMUSCULAR | Status: AC | PRN
  Administered 2024-09-18: 5 mL

## 2024-09-18 NOTE — Progress Notes (Signed)
 Office Visit Note   Patient: Molly Hanson           Date of Birth: [birth year]           MRN: [id number] Visit Date: 09/18/2024              Requested by: Lendia Boby CROME, NP-C [address] [place],  [place] [address] PCP: Lendia Boby CROME, NP-C  Chief Complaint  Patient presents with   Left Knee - Pain   Right Knee - Pain      HPI: 68 y/o female with bilateral knee pain.  She was last seen in our office with left knee pain and has had a cortisone injection on 03/18/24 that did help in the past.   She has known osteoarthritis of the left knee.   She has pain popping in both knees.  He pain increases after she has sat for a while and then gets up to walk.  She feels stiff and achy.  Occasionally she feels like she has swelling in the left > right knee.  She will more a certain way when she is trying to go to sleep and her right knee hurts and pops.  She denies recent trauma or over doing activities.  She does try to walk for exercise, but she does not do strengthening exercises.    Assessment & Plan: Visit Diagnoses:  1. Chronic pain of both knees   2. Unilateral primary osteoarthritis, left knee   3. Unilateral primary osteoarthritis, right knee     Plan: Bilateral knee joint injection with steroid.  She tolerated this well and will follow up as needed.  She can have another joint injection in 3-4 months.  If her pain progresses and the injection fail to work, she may need TRKA in the future.  I instructed her to start a home health strengthening program.  I showed her sit to stand from a chair, heel and toe raises to start.  She will look up thera band strengthening exercises to start as well.  She will continue her walking program as well.  Follow-Up Instructions: Return if symptoms worsen or fail to improve.   Ortho Exam  Patient is alert, oriented, no adenopathy, well-dressed, normal affect, normal respiratory effort. Left > Right knee with medial and lateral joint  line tenderness, crepitus to palpation of the knee cap with active flexion and extension.  No laxity with valgus varus stressing. No joint effusion noted.  No cellulitis.  She palpable DP pulses B LE.      Imaging: Left > right lateral joint line narrowing, lateral osteophytes  bone spurs, that form along the edges of the joint.   Right greater than left patellar spurring.    Labs: Lab Results  Component Value Date   HGBA1C 6.4 09/06/2024   HGBA1C 6.1 (H) 05/10/2024   HGBA1C 6.4 (H) 02/29/2024   ESRSEDRATE 20 07/18/2024   CRP 1.3 (H) 07/18/2024     Lab Results  Component Value Date   ALBUMIN 3.5 08/21/2024   ALBUMIN 4.3 06/23/2024   ALBUMIN 3.8 05/18/2024    No results found for: MG Lab Results  Component Value Date   VD25OH 28.6 (L) 06/16/2023   VD25OH 28.5 (L) 08/04/2022    No results found for: PREALBUMIN    Latest Ref Rng & Units 08/21/2024    1:09 PM 07/18/2024    9:36 AM 06/23/2024    9:39 AM  CBC EXTENDED  WBC 4.0 - 10.5 K/uL  8.4  7.4  9.2   RBC 3.87 - 5.11 MIL/uL 4.83  5.02  5.10   Hemoglobin 12.0 - 15.0 g/dL 86.1  85.6  85.3   HCT 36.0 - 46.0 % 43.1  43.9  44.6   Platelets 150 - 400 K/uL 297  258  238   NEUT# 1.7 - 7.7 K/uL 5.3  4.9  6.6   Lymph# 0.7 - 4.0 K/uL 2.2  1.8  1.9      There is no height or weight on file to calculate BMI.  Orders:  Orders Placed This Encounter  Procedures   Large Joint Inj   XR Knee 1-2 Views Right   XR Knee 1-2 Views Left   No orders of the defined types were placed in this encounter.    Procedures: Large Joint Inj: bilateral knee on 09/18/2024 12:48 PM Indications: pain and diagnostic evaluation Details: 22 G 1.5 in needle  Arthrogram: No  Medications (Right): 5 mL lidocaine  (PF) 1 %; 40 mg methylPREDNISolone  acetate 40 MG/ML Medications (Left): 5 mL lidocaine  (PF) 1 %; 40 mg methylPREDNISolone  acetate 40 MG/ML Outcome: tolerated well, no immediate complications Procedure, treatment alternatives, risks and  benefits explained, specific risks discussed. Consent was given by the patient. Immediately prior to procedure a time out was called to verify the correct patient, procedure, equipment, support staff and site/side marked as required. Patient was prepped and draped in the usual sterile fashion.      Clinical Data: No additional findings.  ROS:  All other systems negative, except as noted in the HPI. Review of Systems  Objective: Vital Signs: There were no vitals taken for this visit.  Specialty Comments:  No specialty comments available.  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   Vertigo 04/04/2024   Chronic abdominal pain 12/02/2023   Abnormal auditory perception of both ears 09/09/2023   Imbalance 09/09/2023   Hx of adenomatous colonic polyps 04/30/2022   Past Medical History:  Diagnosis Date   Arthritis    knees, arm    Hyperlipidemia    has been elevated in the past - no meds    Referred otalgia, right 05/01/2024   Vitamin D  deficiency     Family History  Problem Relation Age of Onset   Breast cancer Mother    Diabetes Mother    Diabetes Father    Diabetes Maternal Grandmother    Stroke Maternal Grandmother    Colon polyps Maternal Grandmother    Colon cancer Maternal Grandfather    Diabetes Paternal Grandmother    Colon polyps Maternal Uncle    Esophageal cancer Neg Hx    Rectal cancer Neg Hx    Stomach cancer Neg Hx     Past Surgical History:  Procedure Laterality Date   COLONOSCOPY      > 10 yrs ago- normal    Social History   Occupational History   Not on file  Tobacco Use   Smoking status: Former    Current packs/day: 0.00    Types: Cigarettes  Quit date: 07/15/2020    Years since quitting: 4.1    Passive exposure: Past   Smokeless tobacco: Never  Vaping Use   Vaping status: Never Used  Substance and Sexual Activity   Alcohol use: Not Currently    Comment: Occassionally.   Drug use: Never   Sexual activity: Not Currently

## 2024-09-19 ENCOUNTER — Ambulatory Visit (HOSPITAL_BASED_OUTPATIENT_CLINIC_OR_DEPARTMENT_OTHER): Admitting: Obstetrics & Gynecology

## 2024-09-19 ENCOUNTER — Encounter (HOSPITAL_BASED_OUTPATIENT_CLINIC_OR_DEPARTMENT_OTHER): Payer: Self-pay | Admitting: Obstetrics & Gynecology

## 2024-09-19 ENCOUNTER — Other Ambulatory Visit (HOSPITAL_COMMUNITY)
Admission: RE | Admit: 2024-09-19 | Discharge: 2024-09-19 | Disposition: A | Discharge status: Home / Self Care | Source: Ambulatory Visit | Attending: Obstetrics & Gynecology | Admitting: Obstetrics & Gynecology

## 2024-09-19 VITALS — BP 131/78 | HR 80 | Ht 66.0 in | Wt 232.8 lb

## 2024-09-19 DIAGNOSIS — N898 Other specified noninflammatory disorders of vagina: Secondary | ICD-10-CM

## 2024-09-19 DIAGNOSIS — S30827A Blister (nonthermal) of anus, initial encounter: Secondary | ICD-10-CM

## 2024-09-19 DIAGNOSIS — N750 Cyst of Bartholin's gland: Secondary | ICD-10-CM

## 2024-09-19 DIAGNOSIS — N6313 Unspecified lump in the right breast, lower outer quadrant: Secondary | ICD-10-CM

## 2024-09-19 MED ORDER — LIDOCAINE 5 % EX OINT: 1.0000 | TOPICAL_OINTMENT | Freq: Three times a day (TID) | CUTANEOUS | 0 refills | Status: DC

## 2024-09-19 MED ORDER — VALACYCLOVIR HCL 1 G PO TABS: 1000.0000 mg | ORAL_TABLET | Freq: Two times a day (BID) | ORAL | 0 refills | Status: DC

## 2024-09-19 NOTE — Progress Notes (Unsigned)
 Problem visit Patient name: Molly Hanson MRN [id number]  Date of birth: 1956/06/24 Chief Complaint:   No chief complaint on file.  History of Present Illness:   Molly Hanson is a 68 y.o. No obstetric history on file. African-American female being seen today for a routine annual exam.   Current complaints: Patient reports lower back and pelvic pain for last three weeks. She reports no vaginal bleeding or discharge. She went to the ED on 08/21/2024 and they found a Bartholin cyst on the left labia. She does report some vaginal itching but no other symptoms. Patient also reports noticing a lump on her right breast a couple of days ago.  No LMP recorded. Patient is postmenopausal.  The pregnancy intention screening data noted above was reviewed. Potential methods of contraception were discussed. The patient elected to proceed with No data recorded.   Last pap 03/31/2024. Results were: NILM w/ HRHPV negative. H/O abnormal pap: no Last mammogram: 09/20/2023. Results were: normal. Next mammogram scheduled for 09/27/2024. Family h/o breast cancer: yes cousin. Last colonoscopy: 12/04/2020. Results were: abnormal three polyps. Family h/o colorectal cancer: yes maternal grandfather.     09/06/2024    2:14 PM 06/23/2024    9:27 AM 05/30/2024    1:49 PM 05/17/2024    3:00 PM 05/03/2024    2:28 PM  Depression screen PHQ 2/9  Decreased Interest 0 0 0 0 0  Down, Depressed, Hopeless 0 0 0 0 0  PHQ - 2 Score 0 0 0 0 0  Altered sleeping    0 1  Tired, decreased energy    0 1  Change in appetite    0 0  Feeling bad or failure about yourself     0 0  Trouble concentrating    0 0  Moving slowly or fidgety/restless    0 0  Suicidal thoughts    0 0  PHQ-9 Score    0 2  Difficult doing work/chores   Not difficult at all Not difficult at all Somewhat difficult        05/30/2024    1:49 PM 05/17/2024    3:00 PM 05/03/2024    2:28 PM 03/31/2024    2:38 PM  GAD 7 : Generalized Anxiety Score  Nervous,  Anxious, on Edge 0 0 0 0  Control/stop worrying 0 0 0 0  Worry too much - different things 0 0 0 0  Trouble relaxing 1 0 0 0  Restless 1 0 0 0  Easily annoyed or irritable 0 0 0 0  Afraid - awful might happen 0 0 0 0  Total GAD 7 Score 2 0 0 0  Anxiety Difficulty Somewhat difficult Not difficult at all Not difficult at all Not difficult at all     Review of Systems:   Pertinent items are noted in HPI Denies any headaches, blurred vision, fatigue, shortness of breath, chest pain, abdominal pain, abnormal vaginal discharge/itching/odor/irritation, problems with periods, bowel movements, urination, or intercourse unless otherwise stated above. Pertinent History Reviewed:  Reviewed past medical,surgical, social and family history.  Reviewed problem list, medications and allergies. Physical Assessment:  There were no vitals filed for this visit.There is no height or weight on file to calculate BMI.        Physical Examination:   General appearance - well appearing, and in no distress  Mental status - alert, oriented to person, place, and time  Psych:  She has a normal mood and affect  Skin -  warm and dry, normal color, no suspicious lesions noted  Chest - effort normal, all lung fields clear to auscultation bilaterally  Heart - normal rate and regular rhythm  Neck:  midline trachea, no thyromegaly or nodules  Breasts - breasts appear normal, no suspicious masses, no skin or nipple changes or  axillary nodes  Abdomen - soft, nontender, nondistended, no masses or organomegaly  Pelvic - VULVA: normal appearing vulva with no masses, tenderness or lesions  VAGINA: normal appearing vagina with normal color and discharge, no lesions  CERVIX: normal appearing cervix without discharge or lesions, no CMT  Thin prep pap is {Desc; done/not:10129} *** HR HPV cotesting  UTERUS: uterus is felt to be normal size, shape, consistency and nontender   ADNEXA: No adnexal masses or tenderness noted.  Rectal  - normal rectal, good sphincter tone, no masses felt. Hemoccult: ***  Extremities:  No swelling or varicosities noted  Chaperone present for exam  No results found for this or any previous visit (from the past 24 hours).  Assessment & Plan:  1) Well-Woman Exam  2) ***  Labs/procedures today: ***  Mammogram: {Mammo f/u:25212::@ 68yo}, or sooner if problems Colonoscopy: {TCS f/u:25213::@ 68yo}, or sooner if problems  No orders of the defined types were placed in this encounter.   Meds: No orders of the defined types were placed in this encounter.   Follow-up: No follow-ups on file.  Morna LOISE Quale, RN 09/19/2024 8:18 AM

## 2024-09-22 LAB — CERVICOVAGINAL ANCILLARY ONLY
Comment: NEGATIVE
HSV1: NEGATIVE
HSV2: NEGATIVE

## 2024-09-25 ENCOUNTER — Encounter: Payer: Self-pay | Admitting: Radiology

## 2024-09-26 ENCOUNTER — Ambulatory Visit (HOSPITAL_BASED_OUTPATIENT_CLINIC_OR_DEPARTMENT_OTHER): Payer: Self-pay | Admitting: Obstetrics & Gynecology

## 2024-09-26 DIAGNOSIS — B353 Tinea pedis: Secondary | ICD-10-CM | POA: Diagnosis not present

## 2024-09-26 DIAGNOSIS — L308 Other specified dermatitis: Secondary | ICD-10-CM | POA: Diagnosis not present

## 2024-09-26 DIAGNOSIS — B351 Tinea unguium: Secondary | ICD-10-CM | POA: Diagnosis not present

## 2024-09-27 ENCOUNTER — Ambulatory Visit
Admission: RE | Admit: 2024-09-27 | Discharge: 2024-09-27 | Disposition: A | Discharge status: Home / Self Care | Source: Ambulatory Visit | Attending: Family Medicine | Admitting: Family Medicine

## 2024-09-27 ENCOUNTER — Other Ambulatory Visit: Payer: Self-pay | Admitting: Family Medicine

## 2024-09-27 DIAGNOSIS — N6311 Unspecified lump in the right breast, upper outer quadrant: Secondary | ICD-10-CM | POA: Diagnosis not present

## 2024-09-27 DIAGNOSIS — N644 Mastodynia: Secondary | ICD-10-CM

## 2024-09-27 DIAGNOSIS — R928 Other abnormal and inconclusive findings on diagnostic imaging of breast: Secondary | ICD-10-CM | POA: Diagnosis not present

## 2024-10-02 ENCOUNTER — Ambulatory Visit
Admission: RE | Admit: 2024-10-02 | Discharge: 2024-10-02 | Disposition: A | Source: Ambulatory Visit | Attending: Family Medicine | Admitting: Family Medicine

## 2024-10-02 DIAGNOSIS — N6311 Unspecified lump in the right breast, upper outer quadrant: Secondary | ICD-10-CM | POA: Diagnosis not present

## 2024-10-02 DIAGNOSIS — N644 Mastodynia: Secondary | ICD-10-CM

## 2024-10-02 DIAGNOSIS — C50411 Malignant neoplasm of upper-outer quadrant of right female breast: Secondary | ICD-10-CM | POA: Diagnosis not present

## 2024-10-02 HISTORY — PX: BREAST BIOPSY: SHX20

## 2024-10-03 ENCOUNTER — Ambulatory Visit (HOSPITAL_BASED_OUTPATIENT_CLINIC_OR_DEPARTMENT_OTHER): Admitting: Obstetrics & Gynecology

## 2024-10-03 ENCOUNTER — Encounter (HOSPITAL_BASED_OUTPATIENT_CLINIC_OR_DEPARTMENT_OTHER): Payer: Self-pay

## 2024-10-03 ENCOUNTER — Telehealth (HOSPITAL_BASED_OUTPATIENT_CLINIC_OR_DEPARTMENT_OTHER): Payer: Self-pay

## 2024-10-03 LAB — SURGICAL PATHOLOGY

## 2024-10-03 NOTE — Progress Notes (Deleted)
   GYNECOLOGY  VISIT  CC:   No chief complaint on file.   HPI: 68 y.o. G2P0111 Single Black or African American female here for re-check of blisters.  No LMP recorded. Patient is postmenopausal.  Past Medical History:  Diagnosis Date   Arthritis    knees, arm    Hyperlipidemia    has been elevated in the past - no meds    Referred otalgia, right 05/01/2024   Vitamin D  deficiency     MEDS:  Reviewed in EPIC  ALLERGIES: Patient has no known allergies.  SH:  ***  ROS  PHYSICAL EXAMINATION:    There were no vitals taken for this visit.    General appearance: alert, cooperative and appears stated age Neck: no adenopathy, supple, symmetrical, trachea midline and thyroid  {CHL AMB PHY EX THYROID  NORM DEFAULT:[phone]::normal to inspection and palpation} CV:  {Exam; heart brief:31539} Lungs:  {pe lungs ob:314451} Breasts: {Exam; breast:13139::normal appearance, no masses or tenderness} Abdomen: soft, non-tender; bowel sounds normal; no masses,  no organomegaly Lymph:  no inguinal LAD noted  Pelvic: External genitalia:  no lesions              Urethra:  normal appearing urethra with no masses, tenderness or lesions              Bartholins and Skenes: normal                 Vagina: {exam; pelvic vaginal:30846}              Cervix: {CHL AMB PHY EX CERVIX NORM [phone]::no lesions}              Bimanual Exam:  Uterus:  {CHL AMB PHY EX UTERUS NORM [phone]::normal size, contour, position, consistency, mobility, non-tender}              Adnexa: {CHL AMB PHY EX ADNEXA NO MASS [phone]::no mass, fullness, tenderness}              Rectovaginal: {yes no:[id number]}.  Confirms.              Anus:  normal sphincter tone, no lesions  Chaperone was present for exam.  Assessment/Plan: There are no diagnoses linked to this encounter.

## 2024-10-03 NOTE — Telephone Encounter (Signed)
 Enter in Error. tbw

## 2024-10-04 ENCOUNTER — Ambulatory Visit: Payer: Self-pay | Admitting: Family Medicine

## 2024-10-05 ENCOUNTER — Encounter: Payer: Self-pay | Admitting: *Deleted

## 2024-10-05 ENCOUNTER — Other Ambulatory Visit: Payer: Self-pay | Admitting: Family Medicine

## 2024-10-05 DIAGNOSIS — C50911 Malignant neoplasm of unspecified site of right female breast: Secondary | ICD-10-CM

## 2024-10-05 NOTE — Progress Notes (Signed)
 Navigator called patient to confirm with patient where she wants to receive care. Some information in chart stated as she is from [place] she may want to get care there. Patient told me she has decided to get care here. Has appt with Dr. Curvin and Dr. Loretha next week. Navigation information given to patient if she has any other questions.

## 2024-10-08 ENCOUNTER — Encounter

## 2024-10-09 DIAGNOSIS — Z823 Family history of stroke: Secondary | ICD-10-CM | POA: Diagnosis not present

## 2024-10-09 DIAGNOSIS — Z6838 Body mass index (BMI) 38.0-38.9, adult: Secondary | ICD-10-CM | POA: Diagnosis not present

## 2024-10-09 DIAGNOSIS — E669 Obesity, unspecified: Secondary | ICD-10-CM | POA: Diagnosis not present

## 2024-10-09 DIAGNOSIS — Z833 Family history of diabetes mellitus: Secondary | ICD-10-CM | POA: Diagnosis not present

## 2024-10-09 DIAGNOSIS — C50919 Malignant neoplasm of unspecified site of unspecified female breast: Secondary | ICD-10-CM | POA: Diagnosis not present

## 2024-10-09 DIAGNOSIS — Z8249 Family history of ischemic heart disease and other diseases of the circulatory system: Secondary | ICD-10-CM | POA: Diagnosis not present

## 2024-10-09 DIAGNOSIS — Z87891 Personal history of nicotine dependence: Secondary | ICD-10-CM | POA: Diagnosis not present

## 2024-10-09 DIAGNOSIS — Z5941 Food insecurity: Secondary | ICD-10-CM | POA: Diagnosis not present

## 2024-10-10 DIAGNOSIS — C50411 Malignant neoplasm of upper-outer quadrant of right female breast: Secondary | ICD-10-CM | POA: Diagnosis not present

## 2024-10-10 DIAGNOSIS — Z170 Estrogen receptor positive status [ER+]: Secondary | ICD-10-CM | POA: Diagnosis not present

## 2024-10-12 ENCOUNTER — Encounter: Payer: Self-pay | Admitting: *Deleted

## 2024-10-13 ENCOUNTER — Inpatient Hospital Stay: Attending: Hematology and Oncology | Admitting: Hematology and Oncology

## 2024-10-13 ENCOUNTER — Inpatient Hospital Stay

## 2024-10-13 ENCOUNTER — Encounter: Payer: Self-pay | Admitting: *Deleted

## 2024-10-13 VITALS — BP 146/58 | HR 89 | Temp 98.1°F | Resp 18 | Ht 66.0 in | Wt 232.0 lb

## 2024-10-13 DIAGNOSIS — C50411 Malignant neoplasm of upper-outer quadrant of right female breast: Secondary | ICD-10-CM | POA: Diagnosis not present

## 2024-10-13 DIAGNOSIS — Z170 Estrogen receptor positive status [ER+]: Secondary | ICD-10-CM | POA: Diagnosis not present

## 2024-10-13 DIAGNOSIS — Z87891 Personal history of nicotine dependence: Secondary | ICD-10-CM | POA: Diagnosis not present

## 2024-10-13 DIAGNOSIS — Z17411 Hormone receptor positive with human epidermal growth factor receptor 2 negative status: Secondary | ICD-10-CM | POA: Diagnosis not present

## 2024-10-13 DIAGNOSIS — Z79811 Long term (current) use of aromatase inhibitors: Secondary | ICD-10-CM | POA: Diagnosis not present

## 2024-10-13 NOTE — Progress Notes (Signed)
 [facility] CONSULT NOTE  Patient Care Team: Lendia Boby CROME, NP-C as PCP - General (Family Medicine) Tobie Tonita POUR, DO as Consulting Physician (Neurology) Gerome Devere HERO, RN as Oncology Nurse Navigator  CHIEF COMPLAINTS/PURPOSE OF CONSULTATION:  New diagnosis of breast cancer  ASSESSMENT & PLAN:  Assessment & Plan Invasive ductal carcinoma of right breast, ER/PR positive, HER2 negative Invasive ductal carcinoma at right breast, 10 o'clock, 3 cm, ER/PR positive, HER2 negative.  - We have reviewed the pathology in detail today. - Referred to plastic surgeon for reduction lumpectomy consultation. - Perform Oncotype DX testing post-lumpectomy for recurrence risk assessment. We have discussed about Oncotype Dx score which is a well validated prognostic scoring system which can predict outcome with endocrine therapy alone and whether chemotherapy reduces recurrence.  Typically in patients with ER positive cancers that are node negative if the RS score is high typically greater than or equal to 26, chemotherapy is recommended.  - Coordinate with radiation oncology for post-surgery radiation therapy, potentially at [place]. - Initiate anastrozole therapy post-radiation to block estrogen receptors and reduce recurrence risk. We have discussed options for antiestrogen therapy today  With regards to Tamoxifen, we discussed that this is a SERM, selective estrogen receptor modulator.With regards to aromatase inhibitors, we discussed mechanism of action, adverse effects including but not limited to post menopausal symptoms, arthralgias, myalgias, increased risk of cardiovascular events and bone loss.   She will RTC after surgery to review oncotype dx results.   HISTORY OF PRESENTING ILLNESS:  Molly Hanson 68 y.o. female is here because of new diagnosis of right sided breast cancer  Discussed the use of AI scribe software for clinical note transcription with the patient, who  gave verbal consent to proceed.  History of Present Illness Molly Hanson is a 68 year old female with breast cancer who presents with a palpable lump in the right breast.   A few weeks ago, she noticed a palpable lump in the right breast. A mammogram revealed concerning areas at the ten o'clock position in the right breast, measuring approximately three centimeters. No axillary adenopathy noted.  She experiences new pain in the back, near the ribs, which is mild and she hasn't noticed any significant worsening.  Her past medical history includes pre-diabetes and high iron levels, which were investigated by a hematologist without a definitive cause identified. She denies any major surgeries, diabetes, or hypertension. She bruises easily and experiences menopausal symptoms such as vaginal dryness, for which she occasionally uses KY gel.  There is no known family history of breast cancer. She has one child, whom she gave birth to at age 70, and did not breastfeed. She recently relocated to [place] from another location.  All other systems were reviewed with the patient and are negative.  MEDICAL HISTORY:  Past Medical History:  Diagnosis Date   Arthritis    knees, arm    Hyperlipidemia    has been elevated in the past - no meds    Referred otalgia, right 05/01/2024   Vitamin D  deficiency     SURGICAL HISTORY: Past Surgical History:  Procedure Laterality Date   BREAST BIOPSY Right 10/02/2024   US  RT BREAST BX W LOC DEV 1ST LESION IMG BX SPEC US  GUIDE 10/02/2024 [facility] MAMMOGRAPHY   COLONOSCOPY     > 10 yrs ago- normal     SOCIAL HISTORY: Social History   Socioeconomic History   Marital status: Single    Spouse name: Not on  file   Number of children: Not on file   Years of education: Not on file   Highest education level: Associate degree: academic program  Occupational History   Not on file  Tobacco Use   Smoking status: Former    Current packs/day: 0.00    Types:  Cigarettes    Quit date: 07/15/2020    Years since quitting: 4.2    Passive exposure: Past   Smokeless tobacco: Never  Vaping Use   Vaping status: Never Used  Substance and Sexual Activity   Alcohol use: Not Currently    Comment: Occassionally.   Drug use: Never   Sexual activity: Yes  Other Topics Concern   Not on file  Social History Narrative   Are you right handed or left handed? Right Handed    Are you currently employed ? No    What is your current occupation? Retired   Do you live at home alone? Yes   Who lives with you? No one    What type of home do you live in: 1 story or 2 story? Two story home       Social Drivers of Health   Financial Resource Strain: Low Risk  (09/05/2024)   Overall Financial Resource Strain (CARDIA)    Difficulty of Paying Living Expenses: Not very hard  Food Insecurity: Food Insecurity Present (10/12/2024)   Hunger Vital Sign    Worried About Running Out of Food in the Last Year: Sometimes true    Ran Out of Food in the Last Year: Sometimes true  Transportation Needs: No Transportation Needs (10/12/2024)   PRAPARE - Administrator, Civil Service (Medical): No    Lack of Transportation (Non-Medical): No  Physical Activity: Insufficiently Active (09/05/2024)   Exercise Vital Sign    Days of Exercise per Week: 1 day    Minutes of Exercise per Session: 30 min  Stress: No Stress Concern Present (09/05/2024)   [name] - Occupational Stress Questionnaire    Feeling of Stress: Not at all  Social Connections: Moderately Integrated (09/05/2024)   Social Connection and Isolation Panel    Frequency of Communication with Friends and Family: Twice a week    Frequency of Social Gatherings with Friends and Family: Once a week    Attends Religious Services: More than 4 times per year    Active Member of [employer] or Organizations: Yes    Attends Engineer, Structural: More than 4 times per year    Marital  Status: Divorced  Intimate Partner Violence: Not At Risk (06/23/2024)   Humiliation, Afraid, Rape, and Kick questionnaire    Fear of Current or Ex-Partner: No    Emotionally Abused: No    Physically Abused: No    Sexually Abused: No    FAMILY HISTORY: Family History  Problem Relation Age of Onset   Breast cancer Mother 51       passed age 85   Diabetes Mother    Diabetes Father    Diabetes Maternal Grandmother    Stroke Maternal Grandmother    Colon polyps Maternal Grandmother    Colon cancer Maternal Grandfather    Diabetes Paternal Grandmother    Colon polyps Maternal Uncle    Esophageal cancer Neg Hx    Rectal cancer Neg Hx    Stomach cancer Neg Hx     ALLERGIES:  has no known allergies.  MEDICATIONS:  Current Outpatient Medications  Medication Sig Dispense Refill  albuterol  (VENTOLIN  HFA) 108 (90 Base) MCG/ACT inhaler Inhale 2 puffs into the lungs every 6 (six) hours as needed for wheezing or shortness of breath. 8 g 2   ALPRAZolam  (XANAX ) 0.5 MG tablet Take 1 tablet (0.5 mg total) by mouth at bedtime as needed for sleep (premedication for the sleep test.). (Patient not taking: Reported on 09/07/2024) 2 tablet 0   atorvastatin  (LIPITOR) 20 MG tablet Take 1 tablet by mouth once daily 90 tablet 0   clotrimazole -betamethasone  (LOTRISONE ) cream Apply 1 Application topically daily. 45 g 2   lidocaine  (XYLOCAINE ) 5 % ointment Apply 1 Application topically 3 (three) times daily. 30 g 0   valACYclovir  (VALTREX ) 1000 MG tablet Take 1 tablet (1,000 mg total) by mouth 2 (two) times daily. 20 tablet 0   vitamin B-12 (CYANOCOBALAMIN ) 50 MCG tablet Take 50 mcg by mouth daily.     No current facility-administered medications for this visit.     PHYSICAL EXAMINATION: ECOG PERFORMANCE STATUS: 0 - Asymptomatic  Vitals:   10/13/24 1328  BP: (!) 146/58  Pulse: 89  Resp: 18  Temp: 98.1 F (36.7 C)  SpO2: 100%   Filed Weights   10/13/24 1328  Weight: 232 lb (105.2 kg)     GENERAL:alert, no distress and comfortable SKIN: skin color, texture, turgor are normal, no rashes or significant lesions EYES: normal, conjunctiva are pink and non-injected, sclera clear OROPHARYNX:no exudate, no erythema and lips, buccal mucosa, and tongue normal  NECK: supple, thyroid  normal size, non-tender, without nodularity LYMPH:  no palpable lymphadenopathy in the cervical, axillary  LUNGS: clear to auscultation and percussion with normal breathing effort HEART: regular rate & rhythm and no murmurs and no lower extremity edema ABDOMEN:abdomen soft, non-tender and normal bowel sounds Musculoskeletal:no cyanosis of digits and no clubbing  PSYCH: alert & oriented x 3 with fluent speech NEURO: no focal motor/sensory deficits  LABORATORY DATA:  I have reviewed the data as listed Lab Results  Component Value Date   WBC 8.4 08/21/2024   HGB 13.8 08/21/2024   HCT 43.1 08/21/2024   MCV 89.2 08/21/2024   PLT 297 08/21/2024     Chemistry      Component Value Date/Time   NA 137 08/21/2024 1309   NA 142 05/10/2024 1429   K 4.2 08/21/2024 1309   CL 104 08/21/2024 1309   CO2 22 08/21/2024 1309   BUN 15 08/21/2024 1309   BUN 14 05/10/2024 1429   CREATININE 0.81 08/21/2024 1309   CREATININE 0.91 06/23/2024 0939      Component Value Date/Time   CALCIUM  8.8 (L) 08/21/2024 1309   ALKPHOS 72 08/21/2024 1309   AST 20 08/21/2024 1309   AST 15 06/23/2024 0939   ALT 21 08/21/2024 1309   ALT 17 06/23/2024 0939   BILITOT 0.5 08/21/2024 1309   BILITOT 0.6 06/23/2024 0939       RADIOGRAPHIC STUDIES: I have personally reviewed the radiological images as listed and agreed with the findings in the report. US  RT BREAST BX W LOC DEV 1ST LESION IMG BX SPEC US  GUIDE Addendum Date: 10/03/2024 ADDENDUM REPORT: 10/03/2024 15:00 ADDENDUM: PATHOLOGY revealed: Breast, right, needle core biopsy, 10:00 (coil clip) - INVASIVE DUCTAL CARCINOMA WITH MUCINOUS FEATURES- OVERALL GRADE: II/III -  LYMPHOVASCULAR INVASION: NOT IDENTIFIED - CANCER LENGTH: 9 MM IN GREATEST LINEAR DIMENSION ON FRAGMENTED CORES. - CALCIFICATIONS: NOT IDENTIFIED Pathology results are CONCORDANT with imaging findings, per Dr. Inocente Ast. Pathology results and recommendations were discussed with patient via telephone on by  Rock Hover RN. Patient reported biopsy site doing well with no adverse symptoms, and slight tenderness at the site. Post biopsy care instructions were reviewed, questions were answered and my direct phone number was provided. Patient was instructed to call [facility] for any additional questions or concerns related to biopsy site. RECOMMENDATIONS: 1. Surgical and oncological consultation. Request for surgical and oncological consultation relayed to patient's provider Mason, Vickie) via epic message by Rock Hover RN on 10/03/2024. Referral to be placed by provider given patient's desire to receive treatment close to her home in [place], [place]. Pathology results reported by Rock Hover RN on 10/03/2024. Electronically Signed   By: Inocente Ast M.D.   On: 10/03/2024 15:00   Result Date: 10/03/2024 CLINICAL DATA:  68 year old female presenting for biopsy of a mass in the right breast. EXAM: ULTRASOUND GUIDED RIGHT BREAST CORE NEEDLE BIOPSY COMPARISON:  Previous exam(s). PROCEDURE: I met with the patient and we discussed the procedure of ultrasound-guided biopsy, including benefits and alternatives. We discussed the high likelihood of a successful procedure. We discussed the risks of the procedure, including infection, bleeding, tissue injury, clip migration, and inadequate sampling. Informed written consent was given. The usual time-out protocol was performed immediately prior to the procedure. Lesion quadrant: Upper outer quadrant Using sterile technique and 1% Lidocaine  as local anesthetic, under direct ultrasound visualization, a 12 gauge spring-loaded device was used to perform  biopsy of a mass in the right breast at 10 o'clock using a lateral approach. At the conclusion of the procedure coil shaped tissue marker clip was deployed into the biopsy cavity. Follow up 2 view mammogram was performed and dictated separately. IMPRESSION: Ultrasound guided biopsy of a mass in the right breast at 10 o'clock. No apparent complications. Electronically Signed: By: Inocente Ast M.D. On: 10/02/2024 14:38   MM CLIP PLACEMENT RIGHT Result Date: 10/02/2024 CLINICAL DATA:  Post procedure mammogram for clip placement EXAM: 3D DIAGNOSTIC RIGHT MAMMOGRAM POST ULTRASOUND BIOPSY COMPARISON:  Previous exam(s). ACR Breast Density Category b: There are scattered areas of fibroglandular density. FINDINGS: 3D Mammographic images were obtained following ultrasound guided biopsy of a mass in the right breast at 10 o'clock. The coil biopsy marking clip is in expected position at the site of biopsy. IMPRESSION: Appropriate positioning of the coil shaped biopsy marking clip at the site of biopsy in the right breast at 10 o'clock. Final Assessment: Post Procedure Mammograms for Marker Placement Electronically Signed   By: Inocente Ast M.D.   On: 10/02/2024 14:38   MM 3D DIAGNOSTIC MAMMOGRAM BILATERAL BREAST Result Date: 09/27/2024 CLINICAL DATA:  Patient presents with a palpable lump in the upper outer right breast that she has noticed for approximately 2 weeks. EXAM: DIGITAL DIAGNOSTIC BILATERAL MAMMOGRAM WITH TOMOSYNTHESIS AND CAD; ULTRASOUND RIGHT BREAST LIMITED TECHNIQUE: Bilateral digital diagnostic mammography and breast tomosynthesis was performed. The images were evaluated with computer-aided detection. ; Targeted ultrasound examination of the right breast was performed COMPARISON:  Previous exam(s). ACR Breast Density Category a: The breasts are almost entirely fatty. FINDINGS: In the upper outer right breast, superficial depth, there is a lobulated mass or group of masses spanning approximately 2  cm in longest dimension and correlating with the palpable lump. There are no other breast masses, no areas of significant asymmetry, no architectural distortion and no suspicious calcifications. On physical exam, there is a small, superficial, relatively firm mass in the upper outer right breast. Targeted ultrasound is performed, showing a hypoechoic lobulated mass or string  of contiguous masses in the superficial right breast at 10 o'clock, 5 cm the nipple, measuring up to 3 cm in longest dimension, measured at 1.9 x 0.7 x 1.1 cm in the radial and anti radial projections. Sonographic imaging of the right axilla demonstrates normal lymph nodes. No enlarged or abnormal nodes. IMPRESSION: 1. Indeterminate mass or contiguous group of masses in the right breast at 10 o'clock, 5 cm the nipple. Tissue sampling is recommended. RECOMMENDATION: 1. Ultrasound-guided core needle biopsy the right breast mass at 10 o'clock. I have discussed the findings and recommendations with the patient. If applicable, a reminder letter will be sent to the patient regarding the next appointment. BI-RADS CATEGORY  4: Suspicious. Electronically Signed   By: Alm Parkins M.D.   On: 09/27/2024 14:12   US  LIMITED ULTRASOUND INCLUDING AXILLA RIGHT BREAST Result Date: 09/27/2024 CLINICAL DATA:  Patient presents with a palpable lump in the upper outer right breast that she has noticed for approximately 2 weeks. EXAM: DIGITAL DIAGNOSTIC BILATERAL MAMMOGRAM WITH TOMOSYNTHESIS AND CAD; ULTRASOUND RIGHT BREAST LIMITED TECHNIQUE: Bilateral digital diagnostic mammography and breast tomosynthesis was performed. The images were evaluated with computer-aided detection. ; Targeted ultrasound examination of the right breast was performed COMPARISON:  Previous exam(s). ACR Breast Density Category a: The breasts are almost entirely fatty. FINDINGS: In the upper outer right breast, superficial depth, there is a lobulated mass or group of masses spanning  approximately 2 cm in longest dimension and correlating with the palpable lump. There are no other breast masses, no areas of significant asymmetry, no architectural distortion and no suspicious calcifications. On physical exam, there is a small, superficial, relatively firm mass in the upper outer right breast. Targeted ultrasound is performed, showing a hypoechoic lobulated mass or string of contiguous masses in the superficial right breast at 10 o'clock, 5 cm the nipple, measuring up to 3 cm in longest dimension, measured at 1.9 x 0.7 x 1.1 cm in the radial and anti radial projections. Sonographic imaging of the right axilla demonstrates normal lymph nodes. No enlarged or abnormal nodes. IMPRESSION: 1. Indeterminate mass or contiguous group of masses in the right breast at 10 o'clock, 5 cm the nipple. Tissue sampling is recommended. RECOMMENDATION: 1. Ultrasound-guided core needle biopsy the right breast mass at 10 o'clock. I have discussed the findings and recommendations with the patient. If applicable, a reminder letter will be sent to the patient regarding the next appointment. BI-RADS CATEGORY  4: Suspicious. Electronically Signed   By: Alm Parkins M.D.   On: 09/27/2024 14:12    All questions were answered. The patient knows to call the clinic with any problems, questions or concerns. I spent 45 minutes in the care of this patient including H and P, review of records, counseling and coordination of care.     Amber Stalls, MD 10/13/2024 1:35 PM

## 2024-10-13 NOTE — Progress Notes (Signed)
 Met patient at her initial med onc appt with Dr. Loretha. She is meeting with Dr. Lowery on 11/28. Informed patient I will be following to see when her surgery date with Dr. Curvin and Dillingham will be and will get her a f/u appt with Dr. Loretha a few weeks after to review Oncotype. Will order Oncotype post op. Patient wants to be referred to [facility] as closer to her home. Patient has navigator's information.

## 2024-10-16 ENCOUNTER — Telehealth: Payer: Self-pay | Admitting: Licensed Clinical Social Worker

## 2024-10-16 NOTE — Telephone Encounter (Signed)
[facility] Clinical Social Work  Clinical Social Work was referred by new patient protocol for assessment of psychosocial needs.  Clinical Social Worker attempted to contact patient by phone  to offer support and assess for needs.   No answer. Left VM with direct contact information.      Emelia Sandoval E ZAVALA, LCSW  Clinical Social Worker [facility]        

## 2024-10-20 ENCOUNTER — Ambulatory Visit (INDEPENDENT_AMBULATORY_CARE_PROVIDER_SITE_OTHER): Admitting: Plastic Surgery

## 2024-10-20 ENCOUNTER — Encounter: Payer: Self-pay | Admitting: Plastic Surgery

## 2024-10-20 VITALS — BP 142/82 | HR 74 | Ht 65.0 in | Wt 233.4 lb

## 2024-10-20 DIAGNOSIS — C50411 Malignant neoplasm of upper-outer quadrant of right female breast: Secondary | ICD-10-CM

## 2024-10-20 DIAGNOSIS — M549 Dorsalgia, unspecified: Secondary | ICD-10-CM | POA: Diagnosis not present

## 2024-10-20 DIAGNOSIS — Z6836 Body mass index (BMI) 36.0-36.9, adult: Secondary | ICD-10-CM

## 2024-10-20 DIAGNOSIS — M542 Cervicalgia: Secondary | ICD-10-CM | POA: Diagnosis not present

## 2024-10-20 DIAGNOSIS — E6609 Other obesity due to excess calories: Secondary | ICD-10-CM

## 2024-10-20 DIAGNOSIS — Z170 Estrogen receptor positive status [ER+]: Secondary | ICD-10-CM | POA: Diagnosis not present

## 2024-10-20 DIAGNOSIS — C50919 Malignant neoplasm of unspecified site of unspecified female breast: Secondary | ICD-10-CM | POA: Insufficient documentation

## 2024-10-20 NOTE — Progress Notes (Signed)
 Patient ID: Molly Hanson, female    DOB: 04-06-1956, 68 y.o.   MRN: [id number]   Chief Complaint  Patient presents with   Consult    The patient is a 68 year old female here for evaluation of her breast.  She was recently diagnosed with right breast cancer.  She felt a mass in the upper outer quadrant.  She had a mammogram and ultrasound.  She was found to have a 3 cm mass in the upper outer quadrant of the right breast very superficial.  The axilla was normal.  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  Negative family history of breast cancer.  She is not a smoker.  She has between a 40 and a 42D to double D bra size.  Her sternal notch to nipple distance was 36 cm both sides and 18 cm from the nipple areola to the inframammary fold.  The patient also states she does have some neck and back pain.    Review of Systems  Constitutional: Negative.   Eyes: Negative.   Respiratory: Negative.    Cardiovascular: Negative.   Gastrointestinal: Negative.   Endocrine: Negative.   Genitourinary: Negative.   Musculoskeletal:  Positive for back pain and neck pain.    Past Medical History:  Diagnosis Date   Arthritis    knees, arm    Hyperlipidemia    has been elevated in the past - no meds    Referred otalgia, right 05/01/2024   Vitamin D  deficiency     Past Surgical History:  Procedure Laterality Date   BREAST BIOPSY Right 10/02/2024   US  RT BREAST BX W LOC DEV 1ST LESION IMG BX SPEC US  GUIDE 10/02/2024 [facility]   COLONOSCOPY     > 10 yrs ago- normal       Current Outpatient Medications:    albuterol  (VENTOLIN  HFA) 108 (90 Base) MCG/ACT inhaler, Inhale 2 puffs into the lungs every 6 (six) hours as needed for wheezing or shortness of breath., Disp: 8 g, Rfl: 2   clotrimazole -betamethasone  (LOTRISONE ) cream, Apply 1 Application topically daily., Disp: 45 g, Rfl: 2    lidocaine  (XYLOCAINE ) 5 % ointment, Apply 1 Application topically 3 (three) times daily., Disp: 30 g, Rfl: 0   vitamin B-12 (CYANOCOBALAMIN ) 50 MCG tablet, Take 50 mcg by mouth daily., Disp: , Rfl:    ALPRAZolam  (XANAX ) 0.5 MG tablet, Take 1 tablet (0.5 mg total) by mouth at bedtime as needed for sleep (premedication for the sleep test.). (Patient not taking: Reported on 10/20/2024), Disp: 2 tablet, Rfl: 0   atorvastatin  (LIPITOR) 20 MG tablet, Take 1 tablet by mouth once daily (Patient not taking: Reported on 10/20/2024), Disp: 90 tablet, Rfl: 0   valACYclovir  (VALTREX ) 1000 MG tablet, Take 1 tablet (1,000 mg total) by mouth 2 (two) times daily., Disp: 20 tablet, Rfl: 0   Objective:   Vitals:   10/20/24 0752  BP: (!) 142/82  Pulse: 74  SpO2: 96%    Physical Exam Vitals reviewed.  Constitutional:      Appearance: Normal appearance.  HENT:     Head: Atraumatic.  Cardiovascular:     Rate and Rhythm: Normal rate.     Pulses: Normal pulses.  Pulmonary:     Effort: Pulmonary effort is normal.  Abdominal:     General: There is no distension.     Palpations: Abdomen is  soft.  Musculoskeletal:        General: No swelling.  Skin:    General: Skin is warm.     Capillary Refill: Capillary refill takes less than 2 seconds.  Neurological:     Mental Status: She is alert and oriented to person, place, and time.  Psychiatric:        Mood and Affect: Mood normal.        Behavior: Behavior normal.        Thought Content: Thought content normal.        Judgment: Judgment normal.     Assessment & Plan:  Class 2 obesity due to excess calories without serious comorbidity with body mass index (BMI) of 36.0 to 36.9 in adult  Malignant neoplasm of upper-outer quadrant of right breast in female, estrogen receptor positive (HCC)  The options for reconstruction we explained to the patient / family for breast reconstruction.  There are two general categories of reconstruction.  We can  reconstruction a breast with implants or use the patient's own tissue.  These were further discussed as listed.  Breast reconstruction is an optional procedure and eligibility depends on the full spectrum of the health of the patient and any co-morbidities.  More than one surgery is often needed to complete the reconstruction process.  The process can take three to twelve months to complete.  The breasts will not be identical due to many factors such as rib differences, shoulder asymmetry and treatments such as radiation.  The goal is to get the breasts to look normal and symmetrical in clothes.  Scars are a part of surgery and may fade some in time but will always be present under clothes.  Surgery may be an option on the non-cancer breast to achieve more symmetry.  No matter which procedure is chosen there is always the risk of complications and even failure of the body to heal.  This could result in no breast.    The options for reconstruction include:  1. Placement of a tissue expander with Acellular dermal matrix. When the expander is the desired size surgery is performed to remove the expander and place an implant.  In some cases the implant can be placed without an expander.  2. Autologous reconstruction can include using a muscle or tissue from another area of the body to create a breast.  3. Combined procedures (ie. latissismus dorsi flap) can be done with an expander / implant placed under the muscle.   The risks, benefits, scars and recovery time were discussed for each of the above. Risks include bleeding, infection, hematoma, seroma, scarring, pain, wound healing complications, flap loss, fat necrosis, capsular contracture, need for implant removal, donor site complications, bulge, hernia, umbilical necrosis, need for urgent reoperation, and need for dressing changes.   The procedure the patient selected / that was best for the patient, was then discussed in further detail.  Total time: 45  minutes. This includes time spent with the patient during the visit as well as time spent before and after the visit reviewing the chart, documenting the encounter, making phone calls and reviewing studies.   The patient would like to have an oncoplastic breast reduction.  I think this is very reasonable and she will do well with this.  We did discuss the risks and complications including change in nipple areola sensation and inability to breast-feed in the future.  Pictures were obtained of the patient and placed in the chart with the patient's or guardian's  permission.   Estefana RAMAN Aria Jarrard, DO

## 2024-10-21 NOTE — Therapy (Signed)
 OUTPATIENT PHYSICAL THERAPY BREAST CANCER BASELINE EVALUATION   Patient Name: Molly Hanson MRN: [id number] DOB:1956-04-17, 68 y.o., female Today's Date: 10/23/2024  END OF SESSION:  PT End of Session - 10/23/24 1258     Visit Number 1    Number of Visits 2    Date for Recertification  12/18/24    Authorization Type [employer]    PT Start Time 1204    PT Stop Time 1257    PT Time Calculation (min) 53 min    Activity Tolerance Patient tolerated treatment well    Behavior During Therapy WFL for tasks assessed/performed          Past Medical History:  Diagnosis Date   Arthritis    knees, arm    Hyperlipidemia    has been elevated in the past - no meds    Referred otalgia, right 05/01/2024   Vitamin D  deficiency    Past Surgical History:  Procedure Laterality Date   BREAST BIOPSY Right 10/02/2024   US  RT BREAST BX W LOC DEV 1ST LESION IMG BX SPEC US  GUIDE 10/02/2024 [facility]   COLONOSCOPY     > 10 yrs ago- normal    Patient Active Problem List   Diagnosis Date Noted   Breast cancer (HCC) 10/20/2024   Class 2 obesity due to excess calories without serious comorbidity with body mass index (BMI) of 36.0 to 36.9 in adult 08/22/2024   Former smoker 08/22/2024   Snoring 08/22/2024   Nocturia more than twice per night 08/22/2024   Excessive daytime sleepiness 08/22/2024   Recurrent circadian rhythm sleep disorder, shift work type 08/22/2024   Bartholin gland cyst 08/21/2024   Erythrocytosis 06/23/2024   Vertigo 04/04/2024   Chronic abdominal pain 12/02/2023   Abnormal auditory perception of both ears 09/09/2023   Imbalance 09/09/2023   Hx of adenomatous colonic polyps 04/30/2022    PCP:   REFERRING PROVIDER: Dr. [name]  REFERRING DIAG: Right Breast Cancer  THERAPY DIAG:  Malignant neoplasm of upper-outer quadrant of right breast in female, estrogen receptor positive (HCC)  Abnormal posture  Rationale for Evaluation and Treatment:  Rehabilitation  ONSET DATE: 10/04/2024  SUBJECTIVE:                                                                                                                                                                                           SUBJECTIVE STATEMENT: Patient reports she is here today to be seen by her medical team for her newly diagnosed right breast cancer.   PERTINENT HISTORY:  Patient was diagnosed on 10/04/2024 with right grade  2 IDC. It measures  approximately 3 cm cm and is located in the upper-outer quadrant. It is ER/PR +, HER 2- with a Ki67 of 10%. Pt will be having a right lumpectomy with reduction (by Dr. Lowery), but does not have a date scheduled yet.  PATIENT GOALS:   reduce lymphedema risk and learn post op HEP.   PAIN:  Are you having pain? Yes: NPRS scale: 0/10 present- 3/10 Pain location: right breast  Pain description: shooting pain Aggravating factors: random, lying on right side Relieving factors: cold pack prn.  PRECAUTIONS: Active CA   RED FLAGS: None   HAND DOMINANCE: right  WEIGHT BEARING RESTRICTIONS: No  FALLS:  Has patient fallen in last 6 months? No  LIVING ENVIRONMENT: Patient lives with: family Lives in: House/apartment   OCCUPATION: Retired  LEISURE: crochet, walk, crafts, swim  PRIOR LEVEL OF FUNCTION: Independent   OBJECTIVE: Note: Objective measures were completed at Evaluation unless otherwise noted.  COGNITION: Overall cognitive status: Within functional limits for tasks assessed    POSTURE:  Forward head and rounded shoulders posture  UPPER EXTREMITY AROM/PROM:  A/PROM RIGHT   eval   Shoulder extension 50  Shoulder flexion 132  Shoulder abduction 145  Shoulder internal rotation 65  Shoulder external rotation 90    (Blank rows = not tested)  A/PROM LEFT   eval  Shoulder extension 62  Shoulder flexion 146  Shoulder abduction 146  Shoulder internal rotation 65  Shoulder external rotation 90     (Blank rows = not tested)  CERVICAL AROM: All within fxl limits:   UPPER EXTREMITY STRENGTH: WFL  LYMPHEDEMA ASSESSMENTS (in cm):   LANDMARK RIGHT   eval  10 cm proximal to olecranon process from proximal aspect of olecranon 42.4  Olecranon process 31.8  10 cm proximal to ulnar styloid process from proximal aspect of styloid process 27.3  Just distal to ulnar styloid process 17.4  Across hand at thumb web space 20.5  At base of 2nd digit 6.75  (Blank rows = not tested)  LANDMARK LEFT   eval  10 cm proximal to olecranon process from proximal aspect of olecranon 41.5   Olecranon process 32.5  10 cm proximal to ulnar styloid process from proximal aspect of styloid process 27.0  Just distal to ulnar styloid process 17.0  Across hand at thumb web space 19.8  At base of 2nd digit 6.7  (Blank rows = not tested)  L-DEX LYMPHEDEMA SCREENING:  The patient was assessed using the L-Dex machine today to produce a lymphedema index baseline score. The patient will be reassessed on a regular basis (typically every 3 months) to obtain new L-Dex scores. If the score is > 6.5 points away from his/her baseline score indicating onset of subclinical lymphedema, it will be recommended to wear a compression garment for 4 weeks, 12 hours per day and then be reassessed. If the score continues to be > 6.5 points from baseline at reassessment, we will initiate lymphedema treatment. Assessing in this manner has a 95% rate of preventing clinically significant lymphedema.    QUICK DASH SURVEY: Not completed;pt just had eyes dilated and was in dark glasses  PATIENT EDUCATION:  Education details: Time spent educating patient on aspects of self-care to maximize post op recovery. Patient was educated on where and how to get a post op compression bra to use to reduce post op edema. Patient was also educated on the use of SOZO screenings and surveillance principles for early identification of lymphedema onset.  She was instructed  to use the post op pillow in the axilla for pressure and pain relief. Patient educated on lymphedema risk reduction and post op shoulder/posture HEP. Person educated: Patient Education method: Explanation, Demonstration, Handout Education comprehension: Patient verbalized understanding and returned demonstration  HOME EXERCISE PROGRAM: Patient was instructed today in a home exercise program today for post op shoulder range of motion. These included active assist shoulder flexion in sitting, scapular retraction, wall walking with shoulder abduction, and hands behind head external rotation.  She was encouraged to do these twice a day, holding 3 seconds and repeating 5 times when permitted by her physician.   ASSESSMENT:  CLINICAL IMPRESSION: Pts multidisciplinary medical team met prior to her assessments to determine a recommended treatment plan. She is planning to have a right lumpectomy with bilateral breast reduction but is not sure if she is having LN's removed. She will benefit from a post op PT reassessment to determine needs and from L-Dex screens every 3 months for 2 years to detect subclinical lymphedema if she has LN's removed.  Pt will benefit from skilled therapeutic intervention to improve on the following deficits: Decreased knowledge of precautions, impaired UE functional use, pain, decreased ROM, postural dysfunction.   PT treatment/interventions: ADL/self-care home management, pt/family education, therapeutic exercise  REHAB POTENTIAL: Good  CLINICAL DECISION MAKING: Stable/uncomplicated  EVALUATION COMPLEXITY: Low   GOALS: Goals reviewed with patient? YES  LONG TERM GOALS: (STG=LTG)    Name Target Date Goal status  1 Pt will be able to verbalize understanding of pertinent lymphedema risk reduction practices relevant to her dx specifically related to skin care.  Baseline:  No knowledge 10/23/2024 Achieved at eval  2 Pt will be able to return demo  and/or verbalize understanding of the post op HEP related to regaining shoulder ROM. Baseline:  No knowledge 10/23/2024 Achieved at eval  3 Pt will be able to verbalize understanding of the importance of viewing the post op After Breast CA Class video for further lymphedema risk reduction education and therapeutic exercise.  Baseline:  No knowledge 10/23/2024 Achieved at eval  4 Pt will demo she has regained full shoulder ROM and function post operatively compared to baselines.  Baseline: See objective measurements taken today. 12/18/2024 RE-EVAL    PLAN:  PT FREQUENCY/DURATION: EVAL and 1 follow up appointment.   PLAN FOR NEXT SESSION: will reassess 3-4 weeks post op to determine needs.   Patient will follow up at outpatient cancer rehab 3-4 weeks following surgery.  If the patient requires physical therapy at that time, a specific plan will be dictated and sent to the referring physician for approval. The patient was educated today on appropriate basic range of motion exercises to begin post operatively and the importance of viewing the After Breast Cancer class video following surgery.  Patient was educated today on lymphedema risk reduction practices as it pertains to recommendations that will benefit the patient immediately following surgery.  She verbalized good understanding.    Physical Therapy Information for After Breast Cancer Surgery/Treatment:  Lymphedema is a swelling condition that you may be at risk for in your arm if you have lymph nodes removed from the armpit area.  After a sentinel node biopsy, the risk is approximately 5-9% and is higher after an axillary node dissection.  There is treatment available for this condition and it is not life-threatening.  Contact your physician or physical therapist with concerns. You may begin the 4 shoulder/posture exercises (see additional sheet) when permitted by your physician (typically a  week after surgery).  If you have drains, you may need  to wait until those are removed before beginning range of motion exercises.  A general recommendation is to not lift your arms above shoulder height until drains are removed.  These exercises should be done to your tolerance and gently.  This is not a no pain/no gain type of recovery so listen to your body and stretch into the range of motion that you can tolerate, stopping if you have pain.  If you are having immediate reconstruction, ask your plastic surgeon about doing exercises as he or she may want you to wait. We encourage you to view the After Breast Cancer class video following surgery.  You will learn information related to lymphedema risk, prevention and treatment and additional exercises to regain mobility following surgery.   While undergoing any medical procedure or treatment, try to avoid blood pressure being taken or needle sticks from occurring on the arm on the side of cancer.   This recommendation begins after surgery and continues for the rest of your life.  This may help reduce your risk of getting lymphedema (swelling in your arm). An excellent resource for those seeking information on lymphedema is the National Lymphedema Network's web site. It can be accessed at www.lymphnet.org If you notice swelling in your hand, arm or breast at any time following surgery (even if it is many years from now), please contact your doctor or physical therapist to discuss this.  Lymphedema can be treated at any time but it is easier for you if it is treated early on.  If you feel like your shoulder motion is not returning to normal in a reasonable amount of time, please contact your surgeon or physical therapist.  [facility] [phone]. [address], [address], [place] [address]  [facility] CLASS After Breast Cancer Class  After Breast Cancer Class is a specially designed exercise class video to assist you in a safe recover after having breast cancer surgery.  In  this video you will learn how to get back to full function whether your drains were just removed or if you had surgery a month ago. The video can be viewed on this page: [email or web address]-meyer.org/ or on YouTube here: [email or web address].  Class Goals  Understand specific stretches to improve the flexibility of you chest and shoulder. Learn ways to safely strengthen your upper body and improve your posture. Understand the warning signs of infection and why you may be at risk for an arm infection. Learn about Lymphedema and prevention.  ** You do not need to view this video until after surgery.  Drains should be removed to participate in the recommended exercises on the video.  Patient was instructed today in a home exercise program today for post op shoulder range of motion. These included active assist shoulder flexion in sitting, scapular retraction, wall walking with shoulder abduction, and hands behind head external rotation.  She was encouraged to do these twice a day, holding 3 seconds and repeating 5 times when permitted by her physician.    Grayce JINNY Sheldon, PT 10/23/2024, 1:00 PM

## 2024-10-23 ENCOUNTER — Ambulatory Visit: Payer: Self-pay | Admitting: General Surgery

## 2024-10-23 ENCOUNTER — Other Ambulatory Visit: Payer: Self-pay

## 2024-10-23 ENCOUNTER — Ambulatory Visit

## 2024-10-23 ENCOUNTER — Encounter: Payer: Self-pay | Admitting: *Deleted

## 2024-10-23 DIAGNOSIS — C50411 Malignant neoplasm of upper-outer quadrant of right female breast: Secondary | ICD-10-CM | POA: Insufficient documentation

## 2024-10-23 DIAGNOSIS — H25042 Posterior subcapsular polar age-related cataract, left eye: Secondary | ICD-10-CM | POA: Diagnosis not present

## 2024-10-23 DIAGNOSIS — Z170 Estrogen receptor positive status [ER+]: Secondary | ICD-10-CM

## 2024-10-23 DIAGNOSIS — H25012 Cortical age-related cataract, left eye: Secondary | ICD-10-CM | POA: Diagnosis not present

## 2024-10-23 DIAGNOSIS — H2512 Age-related nuclear cataract, left eye: Secondary | ICD-10-CM | POA: Diagnosis not present

## 2024-10-23 DIAGNOSIS — R293 Abnormal posture: Secondary | ICD-10-CM | POA: Insufficient documentation

## 2024-10-23 NOTE — Progress Notes (Signed)
 Patient called the end of last week to ask about surgery date. She saw Dr. Lowery last Friday and Dr. Curvin has entered orders today. I left patient a voice mail stating the surgical office will be calling her with a surgery date. Navigator's contact information left with patient.

## 2024-10-24 ENCOUNTER — Ambulatory Visit

## 2024-10-24 VITALS — BP 127/79 | HR 67 | Temp 97.7°F | Resp 16 | Ht 65.0 in | Wt 236.0 lb

## 2024-10-24 DIAGNOSIS — R7689 Other specified abnormal immunological findings in serum: Secondary | ICD-10-CM | POA: Diagnosis not present

## 2024-10-24 DIAGNOSIS — M2550 Pain in unspecified joint: Secondary | ICD-10-CM

## 2024-10-24 DIAGNOSIS — L4050 Arthropathic psoriasis, unspecified: Secondary | ICD-10-CM

## 2024-10-24 DIAGNOSIS — M1380 Other specified arthritis, unspecified site: Secondary | ICD-10-CM | POA: Diagnosis not present

## 2024-10-24 NOTE — Progress Notes (Unsigned)
 Office Visit Note  Patient: Molly Hanson             Date of Birth: 20-Jan-1956           MRN: [id number]             PCP: Lendia Boby CROME, NP-C Referring: Federico Norleen ONEIDA MADISON, MD Visit Date: 10/24/2024 Occupation: Data Unavailable  Subjective:  No chief complaint on file.  Discussed the use of AI scribe software for clinical note transcription with the patient, who gave verbal consent to proceed.  History of Present Illness Molly Hanson is a 68 year old female with breast cancer who presents with worsening arthritis symptoms. She was referred by Dr. Federico, an oncologist, for evaluation of arthritis symptoms.  She experiences body-wide soreness and stiffness, particularly in her legs, which has been progressively worsening over the past four to five years. The stiffness is more pronounced after prolonged sitting or long car rides and is primarily in her muscles. Swelling in her ankles and knees improves with leg elevation.  She has a history of elevated ferritin levels. She stopped atorvastatin  due to adverse effects, although muscle aches were present prior to taking the medication.  Stiffness in her fingers is particularly notable in the mornings, with one finger (right 2nd digit) being notably stiff and swollen. No significant stiffness in her upper arms or hands, except for the mentioned finger.  Intermittent numbness in her legs occurs, particularly at night, and is relieved by lying on her back. No significant back injuries reported.  A rash on her palms started in October, described as small circles that come and go, associated with itching and tenderness. Urea cream is used for the rash, which was suggested to be fungal by a dermatologist. She also reports easy bruising and tenderness in her body.  No significant family history of autoimmune diseases, although her grandparents had arthritis, but the type is unknown.  She has a history of breast cancer and is awaiting  surgery. She reports a dry mouth that started recently but denies dry eyes, patchy hair loss, or sores in her mouth.    Activities of Daily Living:  Patient reports morning stiffness for 2 hours.   Patient Reports nocturnal pain.  Difficulty dressing/grooming: Denies Difficulty climbing stairs: Reports Difficulty getting out of chair: Reports Difficulty using hands for taps, buttons, cutlery, and/or writing: Reports  Review of Systems  Constitutional:  Positive for fatigue.  HENT:  Positive for mouth dryness. Negative for mouth sores.   Eyes:  Negative for dryness.  Respiratory:  Negative for shortness of breath.   Cardiovascular:  Negative for chest pain and palpitations.  Gastrointestinal:  Negative for blood in stool, constipation and diarrhea.  Endocrine: Positive for increased urination.  Genitourinary:  Negative for involuntary urination.  Musculoskeletal:  Positive for joint pain, gait problem, joint pain, joint swelling, myalgias, morning stiffness, muscle tenderness and myalgias. Negative for muscle weakness.  Skin:  Positive for color change and rash. Negative for hair loss and sensitivity to sunlight.  Allergic/Immunologic: Negative for susceptible to infections.  Neurological:  Positive for dizziness. Negative for headaches.  Hematological:  Negative for swollen glands.  Psychiatric/Behavioral:  Positive for sleep disturbance. Negative for depressed mood. The patient is not nervous/anxious.     PMFS History:  Patient Active Problem List   Diagnosis Date Noted   Breast cancer (HCC) 10/20/2024   Class 2 obesity due to excess calories without serious comorbidity with body mass index (BMI)  of 36.0 to 36.9 in adult 08/22/2024   Former smoker 08/22/2024   Snoring 08/22/2024   Nocturia more than twice per night 08/22/2024   Excessive daytime sleepiness 08/22/2024   Recurrent circadian rhythm sleep disorder, shift work type 08/22/2024   Bartholin gland cyst 08/21/2024    Erythrocytosis 06/23/2024   Vertigo 04/04/2024   Chronic abdominal pain 12/02/2023   Abnormal auditory perception of both ears 09/09/2023   Imbalance 09/09/2023   Hx of adenomatous colonic polyps 04/30/2022    Past Medical History:  Diagnosis Date   Arthritis    knees, arm    Hyperlipidemia    has been elevated in the past - no meds    Referred otalgia, right 05/01/2024   Vitamin D  deficiency     Family History  Problem Relation Age of Onset   Breast cancer Mother 67       passed age 34   Diabetes Father    Hypertension Father    Healthy Sister    Kidney failure Brother    Other Brother        prostate surgery   Colon polyps Maternal Uncle    Diabetes Maternal Grandmother    Stroke Maternal Grandmother    Colon polyps Maternal Grandmother    Colon cancer Maternal Grandfather    Diabetes Paternal Grandmother    Hypertension Son    Esophageal cancer Neg Hx    Rectal cancer Neg Hx    Stomach cancer Neg Hx    Past Surgical History:  Procedure Laterality Date   BREAST BIOPSY Right 10/02/2024   US  RT BREAST BX W LOC DEV 1ST LESION IMG BX SPEC US  GUIDE 10/02/2024 [facility]   COLONOSCOPY     > 10 yrs ago- normal    TUBAL LIGATION  1994   Social History   Tobacco Use   Smoking status: Former    Current packs/day: 0.00    Types: Cigarettes    Quit date: 07/15/2020    Years since quitting: 4.2    Passive exposure: Past   Smokeless tobacco: Never  Vaping Use   Vaping status: Never Used  Substance Use Topics   Alcohol use: Not Currently   Drug use: Never   Social History   Social History Narrative   Are you right handed or left handed? Right Handed    Are you currently employed ? No    What is your current occupation? Retired   Do you live at home alone? Yes   Who lives with you? No one    What type of home do you live in: 1 story or 2 story? Two story home         Immunization History  Administered Date(s) Administered   Influenza,inj,Quad PF,6+  Mos 08/04/2022   PFIZER(Purple Top)SARS-COV-2 Vaccination 02/14/2020, 03/06/2020   Tdap 03/24/2024     Objective: Vital Signs: BP 127/79 (BP Location: Left Arm, Patient Position: Sitting, Cuff Size: Large)   Pulse 67   Temp 97.7 F (36.5 C)   Resp 16   Ht 5' 5 (1.651 m)   Wt 236 lb (107 kg)   BMI 39.27 kg/m    Physical Exam Vitals and nursing note reviewed.  HENT:     Head: Normocephalic and atraumatic.     Nose: Nose normal.  Eyes:     Conjunctiva/sclera: Conjunctivae normal.     Pupils: Pupils are equal, round, and reactive to light.  Pulmonary:     Effort: Pulmonary effort is normal. No  respiratory distress.  Skin:    General: Skin is warm and dry.  Neurological:     Mental Status: She is alert. Mental status is at baseline.  Psychiatric:        Mood and Affect: Mood normal.        Behavior: Behavior normal.      Musculoskeletal Exam:   CDAI Exam: CDAI Score: -- Patient Global: --; Provider Global: -- Swollen: 0 ; Tender: 5  Joint Exam 10/24/2024      Right  Left  MCP 2   Tender   Tender  MCP 3   Tender   Tender  PIP 2 (finger)   Tender        Investigation: No additional findings.  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 - CALCIFICATIONS: NOT IDENTIFIED Pathology results are CONCORDANT with imaging findings, per Dr. Inocente Ast. Pathology results and recommendations were discussed with patient via telephone on by Rock Hover RN. Patient reported biopsy site doing well with no adverse symptoms, and slight tenderness at the site. Post biopsy care instructions were reviewed, questions were answered and  my direct phone number was provided. Patient was instructed to call [facility] for any additional questions or concerns related to biopsy site. RECOMMENDATIONS: 1. Surgical and oncological consultation. Request for surgical and oncological consultation relayed to patient's provider Mason, Vickie) via epic message by Rock Hover RN on 10/03/2024. Referral to be placed by provider given patient's desire to receive treatment close to her home in [place], [place]. Pathology results reported by Rock Hover RN on 10/03/2024. Electronically Signed   By: Inocente Ast M.D.   On: 10/03/2024 15:00   Result Date: 10/03/2024 CLINICAL DATA:  68 year old female presenting for biopsy of a mass in the right breast. EXAM: ULTRASOUND GUIDED RIGHT BREAST CORE NEEDLE BIOPSY COMPARISON:  Previous exam(s). PROCEDURE: I met with the patient and we discussed the procedure of ultrasound-guided biopsy, including benefits and alternatives. We discussed the high likelihood of a successful procedure. We discussed the risks of the procedure, including infection, bleeding, tissue injury, clip migration, and inadequate sampling. Informed written consent was given. The usual time-out protocol was performed immediately prior to the procedure. Lesion quadrant: Upper outer quadrant Using sterile technique and 1% Lidocaine  as local anesthetic, under direct ultrasound visualization, a 12 gauge spring-loaded device was used to perform biopsy of a mass in the right breast at 10 o'clock using a lateral approach. At the conclusion of the procedure coil shaped tissue marker clip was deployed into the biopsy cavity. Follow up 2 view mammogram was performed and dictated separately. IMPRESSION: Ultrasound guided biopsy of a mass in the right breast at 10 o'clock. No apparent complications. Electronically Signed: By: Inocente Ast M.D. On: 10/02/2024 14:38   MM CLIP PLACEMENT RIGHT Result Date: 10/02/2024 CLINICAL DATA:  Post  procedure mammogram for clip placement EXAM: 3D DIAGNOSTIC RIGHT MAMMOGRAM POST ULTRASOUND BIOPSY COMPARISON:  Previous exam(s). ACR Breast Density Category b: There are scattered areas of fibroglandular density. FINDINGS: 3D Mammographic images were obtained following ultrasound guided biopsy of a mass in the right breast at 10 o'clock. The coil biopsy marking clip is in expected position at  the site of biopsy. IMPRESSION: Appropriate positioning of the coil shaped biopsy marking clip at the site of biopsy in the right breast at 10 o'clock. Final Assessment: Post Procedure Mammograms for Marker Placement Electronically Signed   By: Inocente Ast M.D.   On: 10/02/2024 14:38    Recent Labs: Lab Results  Component Value Date   WBC 8.4 08/21/2024   HGB 13.8 08/21/2024   PLT 297 08/21/2024   NA 137 08/21/2024   K 4.2 08/21/2024   CL 104 08/21/2024   CO2 22 08/21/2024   GLUCOSE 108 (H) 08/21/2024   BUN 15 08/21/2024   CREATININE 0.81 08/21/2024   BILITOT 0.5 08/21/2024   ALKPHOS 72 08/21/2024   AST 20 08/21/2024   ALT 21 08/21/2024   PROT 7.5 08/21/2024   ALBUMIN 3.5 08/21/2024   CALCIUM  8.8 (L) 08/21/2024   GFRAA 84 10/04/2020    Speciality Comments: No specialty comments available.  Procedures:  No procedures performed Allergies: Patient has no known allergies.   Assessment / Plan:     Visit Diagnoses:  Inflammatory arthropathy Patient with b/l LE pain and stiffness of unclear etiology. She does have AM stiffness lasting for >30 minutes which is a little unusual for non-inflammatory symptoms. However, she does also have significant pain throughout her entire extremity, which is not really associated with an inflammatory process. 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 However, will check Cyclic citrul peptide antibody, IgG and Rheumatoid Factor today.  Positive ANA Patient with positive ANA (1:320) with s/s  of inflammatory arthropathy.  Patient does not have any features of SLE (no objective malar rash, inflammatory arthritis, Raynaud's, alopecia, recurrent cytopenias) or scleroderma (no sclerodactyly, no Raynaud's). She does have dry eyes and dry mouth, so will proceed with SSA and SSB today. However, very low suspicion that this is the cause of her positive ANA.   Positive ANA need not represent presence of clinically active systemic autoimmune disease.  Can be positive in the normal population, autoimmune thyroid  disease (Graves' disease, Hashimoto's thyroiditis etc.), or infection. ANA can be positive in the healthy population at the following rates: ANA 1:40: 20%-30%, ANA 1:80: 10%-15%, ANA 1:160: 5%, ANA 1:320: 3% positive, healthy relative of an SLE patient: 5%-25% positive (usually low titers), and elderly (age >70 years): up to 70% positive at ANA titer 1:40. Can also be positive in a patient with malignancy, which I suspect that this is the source of her positive ANA at this time.  Psoriasis? Dactylitis? Patient with evidence of what appears to be possible palmar psoriasis on hands and feet. Unclear if related to her inflammatory arthropathy discussed above. She does report right 2nd digit pain that could resemble a dactylitis. Will obtain HLA-B27 today.   Orders: Orders Placed This Encounter  Procedures   Rheumatoid Factor   Sjogren's syndrome antibods(ssa + ssb)   Cyclic citrul peptide antibody, IgG   HLA-B27 antigen   No orders of the defined types were placed in this encounter.   I personally spent a total of 60 minutes in the care of the patient today including preparing to see the patient, getting/reviewing separately obtained history, performing a medically appropriate exam/evaluation, counseling and educating, placing orders, and documenting clinical information in the EHR  Follow-Up Instructions: No follow-ups on file.   Asberry Claw, DO  Note - This record has been  created using Animal nutritionist.  Chart creation errors have been sought, but may not  always  have been located. Such creation errors do not reflect on  the standard of medical care.

## 2024-10-25 ENCOUNTER — Encounter: Payer: Self-pay | Admitting: *Deleted

## 2024-10-26 LAB — SJOGREN'S SYNDROME ANTIBODS(SSA + SSB)
SSA (Ro) (ENA) Antibody, IgG: <1 AI
SSB (La) (ENA) Antibody, IgG: <1 AI

## 2024-10-26 LAB — RHEUMATOID FACTOR: Rheumatoid fact SerPl-aCnc: 10 [IU]/mL (ref <14)

## 2024-10-26 LAB — CYCLIC CITRUL PEPTIDE ANTIBODY, IGG: Cyclic Citrullin Peptide Ab: <16 U

## 2024-11-13 ENCOUNTER — Telehealth: Payer: Self-pay | Admitting: Family Medicine

## 2024-11-13 MED ORDER — POLYETHYLENE GLYCOL 3350 17 GM/SCOOP PO POWD: 17.0000 g | Freq: Two times a day (BID) | ORAL | 0 refills | Status: DC | PRN

## 2024-11-13 NOTE — Telephone Encounter (Signed)
 Unable to pend, last filled 07/2024

## 2024-11-13 NOTE — Telephone Encounter (Signed)
 Copied from CRM #[id number]. Topic: Clinical - Medication Refill >> Nov 13, 2024 10:42 AM Zy'onna H wrote: Medication: polyethylene glycol powder (GLYCOLAX /MIRALAX ) 17 GM/SCOOP powder  Has the patient contacted their pharmacy? No (Agent: If no, request that the patient contact the pharmacy for the refill. If patient does not wish to contact the pharmacy document the reason why and proceed with request.) (Agent: If yes, when and what did the pharmacy advise?)  This is the patient's preferred pharmacy:  [facility] [address] ([address]), [place] - [address] [place] ([place]) [place] [address] Phone: [phone] Fax: [phone]  [facility] [address] [place] - [address] [place] [address] Phone: [phone] Fax: [phone]  Is this the correct pharmacy for this prescription? Yes If no, delete pharmacy and type the correct one.   Has the prescription been filled recently? No  Is the patient out of the medication? Yes  Has the patient been seen for an appointment in the last year OR does the patient have an upcoming appointment? Yes  Can we respond through MyChart? Yes  Agent: Please be advised that Rx refills may take up to 3 business days. We ask that you follow-up with your pharmacy.

## 2024-11-13 NOTE — Telephone Encounter (Signed)
 Rx sent.

## 2024-11-20 ENCOUNTER — Ambulatory Visit: Payer: Self-pay

## 2024-11-24 ENCOUNTER — Ambulatory Visit: Payer: Self-pay | Admitting: Family Medicine

## 2024-11-24 ENCOUNTER — Encounter: Payer: Self-pay | Admitting: Family Medicine

## 2024-11-24 ENCOUNTER — Ambulatory Visit: Admitting: Family Medicine

## 2024-11-24 DIAGNOSIS — E538 Deficiency of other specified B group vitamins: Secondary | ICD-10-CM

## 2024-11-24 DIAGNOSIS — E785 Hyperlipidemia, unspecified: Secondary | ICD-10-CM | POA: Diagnosis not present

## 2024-11-24 DIAGNOSIS — Z7985 Long-term (current) use of injectable non-insulin antidiabetic drugs: Secondary | ICD-10-CM

## 2024-11-24 DIAGNOSIS — E1169 Type 2 diabetes mellitus with other specified complication: Secondary | ICD-10-CM

## 2024-11-24 LAB — MICROALBUMIN / CREATININE URINE RATIO
Creatinine,U: 207.7 mg/dL
Microalb Creat Ratio: 8.1 mg/g (ref 0.0–30.0)
Microalb, Ur: 1.7 mg/dL (ref 0.7–1.9)

## 2024-11-24 MED ORDER — POLYETHYLENE GLYCOL 3350 17 GM/SCOOP PO POWD: 17.0000 g | Freq: Two times a day (BID) | ORAL | 0 refills | Status: DC | PRN

## 2024-11-24 MED ORDER — ROSUVASTATIN CALCIUM 10 MG PO TABS: 10.0000 mg | ORAL_TABLET | Freq: Every day | ORAL | 1 refills | Status: DC

## 2024-11-24 MED ORDER — TIRZEPATIDE 2.5 MG/0.5ML ~~LOC~~ SOAJ: 2.5000 mg | SUBCUTANEOUS | 1 refills | Status: AC

## 2024-11-24 NOTE — Progress Notes (Signed)
 "  Subjective:     Patient ID: Molly Hanson, female    DOB: 28-Sep-1956, 69 y.o.   MRN: [id number]  Chief Complaint  Patient presents with   Diabetes    Needs diabetic kidney urine test  Also wants to discuss diet pills    Diabetes    Discussed the use of AI scribe software for clinical note transcription with the patient, who gave verbal consent to proceed.  History of Present Illness Molly Hanson is a 69 year old female with diabetes and hyperlipidemia who presents for follow-up on her chronic health conditions.  Hyperlipidemia - Last LDL was 138 mg/dL six months ago. - Not currently on cholesterol-lowering medication after discontinuing a prior agent within a week due to nausea and dizziness. - Open to trying a different cholesterol-lowering medication.  Diabetes mellitus - Last hemoglobin A1c was 6.4% on September 06, 2024. - Urinary frequency occurs about every hour, associated with very high fluid intake (six to nine 16-ounce bottles of water daily). - Prior urine tests negative for infection and proteinuria.  Abdominal pain - Intermittent right lower abdominal pain with occasional radiation to the back. - Pain is not persistent. - Gynecologic evaluation was unrevealing. - Abdominal and pelvic imaging in September showed no concerning findings.  Breast cancer surgery and screening - Scheduled for breast cancer surgery on December 13, 2024.  Weight management - Interested in weight loss. - discussed starting a GLP-1 agonist for weight management.  Bowel regulation - Requests prescription for Miralax , which has helped with bowel regulation in the past.     Health Maintenance Due  Topic Date Due   FOOT EXAM  Never done   Zoster Vaccines- Shingrix (1 of 2) Never done    Past Medical History:  Diagnosis Date   Arthritis    knees, arm    Hyperlipidemia    has been elevated in the past - no meds    Referred otalgia, right 05/01/2024   Vitamin D   deficiency     Past Surgical History:  Procedure Laterality Date   BREAST BIOPSY Right 10/02/2024   US  RT BREAST BX W LOC DEV 1ST LESION IMG BX SPEC US  GUIDE 10/02/2024 [facility]   COLONOSCOPY     > 10 yrs ago- normal    TUBAL LIGATION  1994    Family History  Problem Relation Age of Onset   Breast cancer Mother 40       passed age 75   Diabetes Father    Hypertension Father    Healthy Sister    Kidney failure Brother    Other Brother        prostate surgery   Colon polyps Maternal Uncle    Diabetes Maternal Grandmother    Stroke Maternal Grandmother    Colon polyps Maternal Grandmother    Colon cancer Maternal Grandfather    Diabetes Paternal Grandmother    Hypertension Son    Esophageal cancer Neg Hx    Rectal cancer Neg Hx    Stomach cancer Neg Hx     Social History   Socioeconomic History   Marital status: Single    Spouse name: Not on file   Number of children: Not on file   Years of education: Not on file   Highest education level: Associate degree: academic program  Occupational History   Not on file  Tobacco Use   Smoking status: Former    Current packs/day: 0.00    Types: Cigarettes  Quit date: 07/15/2020    Years since quitting: 4.3    Passive exposure: Past   Smokeless tobacco: Never  Vaping Use   Vaping status: Never Used  Substance and Sexual Activity   Alcohol use: Not Currently   Drug use: Never   Sexual activity: Yes  Other Topics Concern   Not on file  Social History Narrative   Are you right handed or left handed? Right Handed    Are you currently employed ? No    What is your current occupation? Retired   Do you live at home alone? Yes   Who lives with you? No one    What type of home do you live in: 1 story or 2 story? Two story home       Social Drivers of Health   Tobacco Use: Medium Risk (11/24/2024)   Patient History    Smoking Tobacco Use: Former    Smokeless Tobacco Use: Never    Passive Exposure: Past   Physicist, Medical Strain: Low Risk (09/05/2024)   Overall Financial Resource Strain (CARDIA)    Difficulty of Paying Living Expenses: Not very hard  Food Insecurity: Food Insecurity Present (10/12/2024)   Epic    Worried About Programme Researcher, Broadcasting/film/video in the Last Year: Sometimes true    Ran Out of Food in the Last Year: Sometimes true  Transportation Needs: No Transportation Needs (10/12/2024)   Epic    Lack of Transportation (Medical): No    Lack of Transportation (Non-Medical): No  Physical Activity: Insufficiently Active (09/05/2024)   Exercise Vital Sign    Days of Exercise per Week: 1 day    Minutes of Exercise per Session: 30 min  Stress: No Stress Concern Present (09/05/2024)   [name] - Occupational Stress Questionnaire    Feeling of Stress: Not at all  Social Connections: Moderately Integrated (09/05/2024)   Social Connection and Isolation Panel    Frequency of Communication with Friends and Family: Twice a week    Frequency of Social Gatherings with Friends and Family: Once a week    Attends Religious Services: More than 4 times per year    Active Member of Clubs or Organizations: Yes    Attends Banker Meetings: More than 4 times per year    Marital Status: Divorced  Intimate Partner Violence: Not At Risk (06/23/2024)   Epic    Fear of Current or Ex-Partner: No    Emotionally Abused: No    Physically Abused: No    Sexually Abused: No  Depression (PHQ2-9): Low Risk (11/24/2024)   Depression (PHQ2-9)    PHQ-2 Score: 0  Alcohol Screen: Low Risk (04/29/2024)   Alcohol Screen    Last Alcohol Screening Score (AUDIT): 1  Housing: High Risk (10/12/2024)   Epic    Unable to Pay for Housing in the Last Year: Yes    Number of Times Moved in the Last Year: 1    Homeless in the Last Year: No  Utilities: At Risk (10/12/2024)   Epic    Threatened with loss of utilities: Yes  Health Literacy: Adequate Health Literacy (02/09/2024)   B1300  Health Literacy    Frequency of need for help with medical instructions: Never    Outpatient Medications Prior to Visit  Medication Sig Dispense Refill   albuterol  (VENTOLIN  HFA) 108 (90 Base) MCG/ACT inhaler Inhale 2 puffs into the lungs every 6 (six) hours as needed for wheezing or shortness of breath. 8 g  2   Multiple Vitamin (MULTIVITAMIN PO) Take by mouth daily.     vitamin B-12 (CYANOCOBALAMIN ) 50 MCG tablet Take 50 mcg by mouth daily.     VITAMIN D  PO Take by mouth.     ALPRAZolam  (XANAX ) 0.5 MG tablet Take 1 tablet (0.5 mg total) by mouth at bedtime as needed for sleep (premedication for the sleep test.). (Patient not taking: Reported on 11/24/2024) 2 tablet 0   griseofulvin (GRIS-PEG) 125 MG tablet Take 125 mg by mouth 2 (two) times daily. (Patient not taking: Reported on 11/24/2024)     ketoconazole (NIZORAL) 2 % cream Apply topically as needed. (Patient not taking: Reported on 11/24/2024)     valACYclovir  (VALTREX ) 1000 MG tablet Take 1 tablet (1,000 mg total) by mouth 2 (two) times daily. (Patient not taking: Reported on 11/24/2024) 20 tablet 0   atorvastatin  (LIPITOR) 20 MG tablet Take 1 tablet by mouth once daily (Patient not taking: Reported on 11/24/2024) 90 tablet 0   clotrimazole -betamethasone  (LOTRISONE ) cream Apply 1 Application topically daily. (Patient not taking: Reported on 10/24/2024) 45 g 2   lidocaine  (XYLOCAINE ) 5 % ointment Apply 1 Application topically 3 (three) times daily. (Patient not taking: Reported on 10/24/2024) 30 g 0   polyethylene glycol powder (GLYCOLAX /MIRALAX ) 17 GM/SCOOP powder Take 17 g by mouth 2 (two) times daily as needed. Dissolve 1 capful (17g) in 4-8 ounces of liquid and take by mouth daily. (Patient not taking: Reported on 11/24/2024) 510 g 0   No facility-administered medications prior to visit.    Allergies[1]  ROS Per HPI     Objective:    Physical Exam Constitutional:      General: She is not in acute distress.    Appearance: She is obese. She  is not ill-appearing.  HENT:     Mouth/Throat:     Mouth: Mucous membranes are moist.     Pharynx: Oropharynx is clear.  Eyes:     Extraocular Movements: Extraocular movements intact.     Conjunctiva/sclera: Conjunctivae normal.  Cardiovascular:     Rate and Rhythm: Normal rate.  Pulmonary:     Effort: Pulmonary effort is normal.  Musculoskeletal:     Cervical back: Normal range of motion and neck supple.  Skin:    General: Skin is warm and dry.  Neurological:     General: No focal deficit present.     Mental Status: She is alert and oriented to person, place, and time.     Motor: No weakness.     Coordination: Coordination normal.     Gait: Gait normal.  Psychiatric:        Mood and Affect: Mood normal.        Behavior: Behavior normal.        Thought Content: Thought content normal.      BP 130/80   Pulse 85   Temp 97.8 F (36.6 C) (Temporal)   Ht 5' 5 (1.651 m)   Wt 238 lb (108 kg)   SpO2 100%   BMI 39.61 kg/m  Wt Readings from Last 3 Encounters:  11/24/24 238 lb (108 kg)  10/24/24 236 lb (107 kg)  10/20/24 233 lb 6.4 oz (105.9 kg)       Assessment & Plan:   Problem List Items Addressed This Visit     B12 deficiency   Hyperlipidemia associated with type 2 diabetes mellitus (HCC)   Relevant Medications   rosuvastatin  (CRESTOR ) 10 MG tablet   tirzepatide  (MOUNJARO ) 2.5 MG/0.5ML Pen  Type 2 diabetes mellitus with morbid obesity (HCC) - Primary   Relevant Medications   rosuvastatin  (CRESTOR ) 10 MG tablet   tirzepatide  (MOUNJARO ) 2.5 MG/0.5ML Pen   Other Relevant Orders   Microalbumin / creatinine urine ratio (Completed)    Assessment and Plan Assessment & Plan Type 2 diabetes mellitus with morbid obesity A1c was 6.4% on October 15th, indicating good control. Reports frequent urination, likely due to high fluid intake. Interested in weight loss options. - Prescribed Mounjaro  (tirzepatide ) for weight loss, to be started post-breast surgery. - Advised  on potential side effects of Mounjaro , including nausea, bloating, and constipation. - Instructed to hydrate and eat small amounts of food to minimize side effects.  Hyperlipidemia associated with type 2 diabetes mellitus LDL was 138 mg/dL six months ago, above the target of less than 70 mg/dL for patients with diabetes. Previous statin therapy was discontinued due to nausea and dizziness. Discussed the importance of lowering LDL to reduce cardiovascular risk, especially in the context of diabetes. - Prescribed rosuvastatin  (Crestor ) 10 mg daily. - Will schedule fasting lipid panel in 6 weeks to assess response to therapy.  Vitamin B12 deficiency B12 levels are low normal. Currently taking over-the-counter B12 supplements. - Continue over-the-counter B12 supplementation.  General Health Maintenance Up to date with colonoscopy and pelvic exams. Recent imaging of abdomen and pelvis showed no concerning findings. Discussed the importance of regular screenings and maintaining a healthy lifestyle. - Continue regular screenings for colon, breast, and cervical cancer. - Maintain current vitamin D  and multivitamin supplementation.     I have discontinued Johnnie D. Kunka's clotrimazole -betamethasone , atorvastatin , and lidocaine . I am also having her start on rosuvastatin  and tirzepatide . Additionally, I am having her maintain her albuterol , ALPRAZolam , vitamin B-12, valACYclovir , griseofulvin, ketoconazole, Multiple Vitamin (MULTIVITAMIN PO), VITAMIN D  PO, and polyethylene glycol powder.  Meds ordered this encounter  Medications   rosuvastatin  (CRESTOR ) 10 MG tablet    Sig: Take 1 tablet (10 mg total) by mouth daily.    Dispense:  90 tablet    Refill:  1    Supervising Provider:   ROLLENE NORRIS A [[id number]]   tirzepatide  (MOUNJARO ) 2.5 MG/0.5ML Pen    Sig: Inject 2.5 mg into the skin once a week.    Dispense:  2 mL    Refill:  1    Supervising Provider:   ROLLENE NORRIS A [[id number]]    polyethylene glycol powder (GLYCOLAX /MIRALAX ) 17 GM/SCOOP powder    Sig: Take 17 g by mouth 2 (two) times daily as needed. Dissolve 1 capful (17g) in 4-8 ounces of liquid and take by mouth daily.    Dispense:  510 g    Refill:  0    Supervising Provider:   ROLLENE NORRIS A [[id number]]       [1] No Known Allergies  "

## 2024-11-24 NOTE — Patient Instructions (Signed)
 Please go downstairs before you leave for a urine test.  I am sending in a new cholesterol medication called rosuvastatin.  Please start taking this daily.  I am also sending in a once weekly injection for diabetes and weight loss called Mounjaro. Do not start this medication until after your surgery.  Continue MiraLAX  for constipation  Follow-up with me in approximately 8 weeks for fasting labs.  Do not eat or drink anything after midnight.  Drink 1 to 2 glasses of water before your appointment.

## 2024-11-27 ENCOUNTER — Telehealth: Payer: Self-pay | Admitting: Neurology

## 2024-11-27 NOTE — Telephone Encounter (Signed)
 NPSG [employer] pending new auth

## 2024-11-28 ENCOUNTER — Telehealth: Payer: Self-pay

## 2024-11-28 ENCOUNTER — Other Ambulatory Visit (HOSPITAL_COMMUNITY): Payer: Self-pay

## 2024-11-28 NOTE — Telephone Encounter (Signed)
 Pharmacy Patient Advocate Encounter  Received notification from [employer] that Prior Authorization for Mounjaro  2.5mg /0.58ml has been CANCELLED due to authorization already on file for this request. Approved from 11/28/24 to 11/22/25

## 2024-11-28 NOTE — Telephone Encounter (Signed)
 Pharmacy Patient Advocate Encounter   Received notification from CoverMyMeds that prior authorization for Mounjaro  2.5mg /0.58ml is required/requested.   Insurance verification completed.   The patient is insured through [employer].   Per test claim: PA required; PA started via CoverMyMeds. KEY [id number] . Waiting for clinical questions to populate.

## 2024-12-02 ENCOUNTER — Encounter: Payer: Self-pay | Admitting: Family Medicine

## 2024-12-04 ENCOUNTER — Encounter: Payer: Self-pay | Admitting: *Deleted

## 2024-12-04 MED ORDER — POLYETHYLENE GLYCOL 3350 17 GM/SCOOP PO POWD: 17.0000 g | Freq: Two times a day (BID) | ORAL | 0 refills | Status: AC | PRN

## 2024-12-04 NOTE — Telephone Encounter (Signed)
 Updated sharaBETHA  NPSG Mylene shara: [phone] (exp. 11/27/24 to 02/20/25)   Patient is scheduled at [facility] for 1/147/26 at 8 pm

## 2024-12-04 NOTE — Progress Notes (Signed)
 Patient called navigator concerned about her amount she will be responsible for with her surgery. She wanted to know if there were any financial assistance available. I informed patient I will pass along to social worker [place]. Patient was appreciative. She has pre op appt this week on the 14th.

## 2024-12-05 ENCOUNTER — Other Ambulatory Visit: Payer: Self-pay

## 2024-12-05 ENCOUNTER — Inpatient Hospital Stay: Attending: Hematology and Oncology | Admitting: Licensed Clinical Social Worker

## 2024-12-05 ENCOUNTER — Encounter (HOSPITAL_BASED_OUTPATIENT_CLINIC_OR_DEPARTMENT_OTHER): Payer: Self-pay | Admitting: General Surgery

## 2024-12-05 NOTE — Progress Notes (Signed)
 [facility] Clinical Social Work  Clinical Social Work was referred by statistician for financial concerns.  Clinical Social Worker contacted patient by phone to offer support and assess for needs.    Patient concerned about cost estimate for surgery and wondering about resources. She previously had Medicare and Medicaid, but no longer has full Medicaid. She lives alone and receives [employer]. She is able to manage her other expenses on her social security income.  Patient will have support from people post-surgery at home. She shared some of her fears with cancer and hope and faith that she will be okay.   Interventions: Provided patient with information about how to apply for assistance through billing with both [facility] & [facility]. Provided hard copies of the applications.   Provided brief emotional support around adjustment to cancer Completed application to [employer] Will refer for [employer] once pt is starting radiation      Follow Up Plan:  Patient will work on applications for financial assistance. CSW will follow-up with grant referral once pt is starting radiation    Ubaldo Daywalt E Arlester Keehan, LCSW  Clinical Social Worker [facility]

## 2024-12-06 ENCOUNTER — Ambulatory Visit (INDEPENDENT_AMBULATORY_CARE_PROVIDER_SITE_OTHER): Admitting: Student

## 2024-12-06 ENCOUNTER — Ambulatory Visit: Admitting: Neurology

## 2024-12-06 VITALS — BP 141/86 | HR 81 | Wt 235.0 lb

## 2024-12-06 DIAGNOSIS — G4719 Other hypersomnia: Secondary | ICD-10-CM

## 2024-12-06 DIAGNOSIS — G4733 Obstructive sleep apnea (adult) (pediatric): Secondary | ICD-10-CM

## 2024-12-06 DIAGNOSIS — E66812 Obesity, class 2: Secondary | ICD-10-CM

## 2024-12-06 DIAGNOSIS — R0683 Snoring: Secondary | ICD-10-CM

## 2024-12-06 DIAGNOSIS — Z170 Estrogen receptor positive status [ER+]: Secondary | ICD-10-CM

## 2024-12-06 DIAGNOSIS — R7689 Other specified abnormal immunological findings in serum: Secondary | ICD-10-CM

## 2024-12-06 DIAGNOSIS — R351 Nocturia: Secondary | ICD-10-CM

## 2024-12-06 DIAGNOSIS — E6609 Other obesity due to excess calories: Secondary | ICD-10-CM

## 2024-12-06 DIAGNOSIS — Z87891 Personal history of nicotine dependence: Secondary | ICD-10-CM

## 2024-12-06 DIAGNOSIS — C50411 Malignant neoplasm of upper-outer quadrant of right female breast: Secondary | ICD-10-CM

## 2024-12-06 DIAGNOSIS — N62 Hypertrophy of breast: Secondary | ICD-10-CM

## 2024-12-06 DIAGNOSIS — C50911 Malignant neoplasm of unspecified site of right female breast: Secondary | ICD-10-CM

## 2024-12-06 MED ORDER — CEPHALEXIN 500 MG PO CAPS: 500.0000 mg | ORAL_CAPSULE | Freq: Four times a day (QID) | ORAL | 0 refills | Status: AC

## 2024-12-06 MED ORDER — ONDANSETRON HCL 4 MG PO TABS: 4.0000 mg | ORAL_TABLET | Freq: Three times a day (TID) | ORAL | 0 refills | Status: DC | PRN

## 2024-12-06 MED ORDER — TRAMADOL HCL 50 MG PO TABS: 50.0000 mg | ORAL_TABLET | Freq: Three times a day (TID) | ORAL | 0 refills | Status: DC | PRN

## 2024-12-06 NOTE — H&P (View-Only) (Signed)
 "    Patient ID: Molly Hanson, female    DOB: [birth year], 69 y.o.   MRN: [id number]  Chief Complaint  Patient presents with   Pre-op Exam      ICD-10-CM   1. Malignant neoplasm of upper-outer quadrant of right breast in female, estrogen receptor positive (HCC)  C50.411    Z17.0        History of Present Illness: Molly Hanson is a 69 y.o.  female  with a history of macromastia.  She presents for preoperative evaluation for upcoming procedure, Bilateral Breast Reduction, scheduled for 12/13/2024 with Dr.  Lowery.  Of note, patient is undergoing right breast lumpectomy and sentinel lymph node biopsy with Dr. Curvin at the same time.  The patient has not had problems with anesthesia.  Patient denies any history of cardiac disease.  She denies taking any blood thinners.  Patient reports that she quit smoking 5 years ago.  Patient denies taking any birth control or hormone replacement.  She denies any history of greater than 3 miscarriages.  She denies any personal history of blood clots.  She reports that her grandmother had a blood clot, but she is unsure what kind.  She denies any personal family history of clotting diseases.  She denies any recent surgeries, traumas or infections.  She denies any history of stroke or heart attack.  She denies any history of Crohn's disease or ulcerative colitis, COPD or asthma.  She denies any varicosities to her lower extremities.  She denies any recent fevers, chills or changes in her health.  Summary of Previous Visit: Patient was seen for initial consult by Dr. Lowery on 10/20/2024.  At this visit, it was noted that patient was recently diagnosed with right breast cancer.  It was noted that patient had between a 40 and 42 D2 DD bra size.  Her STN was 36 cm on both sides.  Patient also did report some back and neck pain.  The patient requested oncoplastic breast reduction.  Job: Retired  PMH Significant for: Type 2 diabetes, hyperlipidemia, breast  cancer  Per chart review, patient's most recent A1c was 6.4 in October.  Patient states that she is having a sleep study done tonight.  I discussed with her that sleep apnea can put her at increased risk of complications with anesthesia.  Patient expressed understanding.  Patient's blood pressure is mildly elevated today.  She denies any recent headaches, changes in her vision, chest pain or shortness of breath.  Patient does state that she has a blood pressure cuff at home.  Recommended that she take her blood pressure when she goes home, if it remains elevated, she should call her PCP.  She expressed understanding.   Past Medical History: Allergies: Allergies[1]  Current Medications: Current Medications[2]  Past Medical Problems: Past Medical History:  Diagnosis Date   Arthritis    knees, arm    Hyperlipidemia    has been elevated in the past - no meds    Referred otalgia, right 05/01/2024   Vitamin D  deficiency     Past Surgical History: Past Surgical History:  Procedure Laterality Date   BREAST BIOPSY Right 10/02/2024   US  RT BREAST BX W LOC DEV 1ST LESION IMG BX SPEC US  GUIDE 10/02/2024 [facility]   COLONOSCOPY     > 10 yrs ago- normal    TUBAL LIGATION  1994    Social History: Social History   Socioeconomic History   Marital status: Single  Spouse name: Not on file   Number of children: Not on file   Years of education: Not on file   Highest education level: Associate degree: academic program  Occupational History   Not on file  Tobacco Use   Smoking status: Former    Current packs/day: 0.00    Types: Cigarettes    Quit date: 07/15/2020    Years since quitting: 4.3    Passive exposure: Past   Smokeless tobacco: Never  Vaping Use   Vaping status: Never Used  Substance and Sexual Activity   Alcohol use: Not Currently   Drug use: Never   Sexual activity: Yes  Other Topics Concern   Not on file  Social History Narrative   Are you right  handed or left handed? Right Handed    Are you currently employed ? No    What is your current occupation? Retired   Do you live at home alone? Yes   Who lives with you? No one    What type of home do you live in: 1 story or 2 story? Two story home       Social Drivers of Health   Tobacco Use: Medium Risk (12/05/2024)   Patient History    Smoking Tobacco Use: Former    Smokeless Tobacco Use: Never    Passive Exposure: Past  Physicist, Medical Strain: Low Risk (09/05/2024)   Overall Financial Resource Strain (CARDIA)    Difficulty of Paying Living Expenses: Not very hard  Food Insecurity: Food Insecurity Present (12/05/2024)   Epic    Worried About Programme Researcher, Broadcasting/film/video in the Last Year: Sometimes true    Ran Out of Food in the Last Year: Never true  Transportation Needs: No Transportation Needs (10/12/2024)   Epic    Lack of Transportation (Medical): No    Lack of Transportation (Non-Medical): No  Physical Activity: Insufficiently Active (09/05/2024)   Exercise Vital Sign    Days of Exercise per Week: 1 day    Minutes of Exercise per Session: 30 min  Stress: No Stress Concern Present (09/05/2024)   [name] - Occupational Stress Questionnaire    Feeling of Stress: Not at all  Social Connections: Moderately Integrated (09/05/2024)   Social Connection and Isolation Panel    Frequency of Communication with Friends and Family: Twice a week    Frequency of Social Gatherings with Friends and Family: Once a week    Attends Religious Services: More than 4 times per year    Active Member of Clubs or Organizations: Yes    Attends Banker Meetings: More than 4 times per year    Marital Status: Divorced  Intimate Partner Violence: Not At Risk (06/23/2024)   Epic    Fear of Current or Ex-Partner: No    Emotionally Abused: No    Physically Abused: No    Sexually Abused: No  Depression (PHQ2-9): Low Risk (11/24/2024)   Depression (PHQ2-9)     PHQ-2 Score: 0  Alcohol Screen: Low Risk (04/29/2024)   Alcohol Screen    Last Alcohol Screening Score (AUDIT): 1  Housing: Unknown (12/05/2024)   Epic    Unable to Pay for Housing in the Last Year: No    Number of Times Moved in the Last Year: Not on file    Homeless in the Last Year: No  Recent Concern: Housing - High Risk (10/12/2024)   Epic    Unable to Pay for Housing in the Last Year:  Yes    Number of Times Moved in the Last Year: 1    Homeless in the Last Year: No  Utilities: At Risk (10/12/2024)   Epic    Threatened with loss of utilities: Yes  Health Literacy: Adequate Health Literacy (02/09/2024)   B1300 Health Literacy    Frequency of need for help with medical instructions: Never    Family History: Family History  Problem Relation Age of Onset   Breast cancer Mother 62       passed age 66   Diabetes Father    Hypertension Father    Healthy Sister    Kidney failure Brother    Other Brother        prostate surgery   Colon polyps Maternal Uncle    Diabetes Maternal Grandmother    Stroke Maternal Grandmother    Colon polyps Maternal Grandmother    Colon cancer Maternal Grandfather    Diabetes Paternal Grandmother    Hypertension Son    Esophageal cancer Neg Hx    Rectal cancer Neg Hx    Stomach cancer Neg Hx     Review of Systems: Denies any recent fevers, chills or changes in her health  Physical Exam: Vital Signs BP (!) 141/86 (BP Location: Left Arm, Patient Position: Sitting, Cuff Size: Large)   Pulse 81   Wt 235 lb (106.6 kg)   SpO2 97%   BMI 39.11 kg/m   Physical Exam  Constitutional:      General: Not in acute distress.    Appearance: Normal appearance. Not ill-appearing.  HENT:     Head: Normocephalic and atraumatic.  Eyes:     Pupils: Pupils are equal, round Neck:     Musculoskeletal: Normal range of motion.  Cardiovascular:     Rate and Rhythm: Normal rate Pulmonary:     Effort: Pulmonary effort is normal. No respiratory distress.   Musculoskeletal: Normal range of motion.  Skin:    General: Skin is warm and dry.     Findings: No erythema or rash.  Neurological:     Mental Status: Alert and oriented to person, place, and time. Mental status is at baseline.  Psychiatric:        Mood and Affect: Mood normal.        Behavior: Behavior normal.    Assessment/Plan: The patient is scheduled for bilateral breast reduction with Dr. Lowery.  Risks, benefits, and alternatives of procedure discussed, questions answered and consent obtained.    Smoking Status: Former smoker, quit 5 years ago; Counseling Given?  N/A Last Mammogram: 09/27/2024, BI-RADS Category 4 suspicious.  Patient underwent biopsy and clip placement on 10/02/2024.  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 Recommendation for mechanical and possible pharmacological prophylaxis. Encourage early ambulation.  Will discuss possibility of postoperative Lovenox with Dr. Lowery.  Pictures obtained: @consult   Post-op Rx sent to pharmacy: Keflex , Zofran , tramadol   Instructed the patient to hold any multivitamins or supplements at least 1 week prior to surgery.  Patient reports she is not taking Mounjaro  anymore.  Discussed with patient to not take Xanax  at the same time as pain medications.  Patient expressed understanding.  Patient was provided with the breast reduction and General Surgical Risk consent document and Pain Medication Agreement prior to their appointment.  They had adequate time to read through the risk consent documents and Pain  Medication Agreement. We also discussed them in person together during this preop appointment. All of their questions were answered to their satisfaction.  Recommended calling if they have any further questions.  Risk consent form and Pain Medication Agreement to be  scanned into patient's chart.  The risk that can be encountered with breast reduction were discussed and include the following but not limited to these:  Breast asymmetry, fluid accumulation, firmness of the breast, inability to breast feed, loss of nipple or areola, skin loss, decrease or no nipple sensation, fat necrosis of the breast tissue, bleeding, infection, healing delay.  There are risks of anesthesia, changes to skin sensation and injury to nerves or blood vessels.  The muscle can be temporarily or permanently injured.  You may have an allergic reaction to tape, suture, glue, blood products which can result in skin discoloration, swelling, pain, skin lesions, poor healing.  Any of these can lead to the need for revisonal surgery or stage procedures.  A reduction has potential to interfere with diagnostic procedures.  Nipple or breast piercing can increase risks of infection.  This procedure is best done when the breast is fully developed.  Changes in the breast will continue to occur over time.  Pregnancy can alter the outcomes of previous breast reduction surgery, weight gain and weigh loss can also effect the long term appearance.   Patient had some questions today in regards to her breast cancer diagnosis. Recommended that she follow up with her oncologist.  All of her questions in regards to breast reduction surgery were answered to her satisfaction.  She expressed understanding.    Electronically signed by: Estefana FORBES Peck, PA-C 12/06/2024 10:53 AM      [1] No Known Allergies [2]  Current Outpatient Medications:    albuterol  (VENTOLIN  HFA) 108 (90 Base) MCG/ACT inhaler, Inhale 2 puffs into the lungs every 6 (six) hours as needed for wheezing or shortness of breath., Disp: 8 g, Rfl: 2   cephALEXin  (KEFLEX ) 500 MG capsule, Take 1 capsule (500 mg total) by mouth 4 (four) times daily for 3 days., Disp: 12 capsule, Rfl: 0   Multiple Vitamin (MULTIVITAMIN PO), Take by mouth daily., Disp: ,  Rfl:    ondansetron  (ZOFRAN ) 4 MG tablet, Take 1 tablet (4 mg total) by mouth every 8 (eight) hours as needed for up to 15 doses for nausea or vomiting., Disp: 15 tablet, Rfl: 0   polyethylene glycol powder (GLYCOLAX /MIRALAX ) 17 GM/SCOOP powder, Take 17 g by mouth 2 (two) times daily as needed. Dissolve 1 capful (17g) in 4-8 ounces of liquid and take by mouth daily., Disp: 510 g, Rfl: 0   rosuvastatin  (CRESTOR ) 10 MG tablet, Take 1 tablet (10 mg total) by mouth daily., Disp: 90 tablet, Rfl: 1   tirzepatide  (MOUNJARO ) 2.5 MG/0.5ML Pen, Inject 2.5 mg into the skin once a week., Disp: 2 mL, Rfl: 1   traMADol  (ULTRAM ) 50 MG tablet, Take 1 tablet (50 mg total) by mouth every 8 (eight) hours as needed for up to 15 doses for moderate pain (pain score 4-6) or severe pain (pain score 7-10)., Disp: 15 tablet, Rfl: 0   vitamin B-12 (CYANOCOBALAMIN ) 50 MCG tablet, Take 50 mcg by mouth daily., Disp: , Rfl:    VITAMIN D  PO, Take by mouth., Disp: , Rfl:    ALPRAZolam  (XANAX ) 0.5 MG tablet, Take 1 tablet (0.5 mg total) by mouth at bedtime as needed for sleep (premedication for the sleep test.). (Patient not taking: Reported on 12/06/2024),  Disp: 2 tablet, Rfl: 0   griseofulvin (GRIS-PEG) 125 MG tablet, Take 125 mg by mouth 2 (two) times daily. (Patient not taking: Reported on 12/06/2024), Disp: , Rfl:    ketoconazole (NIZORAL) 2 % cream, Apply topically as needed. (Patient not taking: Reported on 12/06/2024), Disp: , Rfl:    valACYclovir  (VALTREX ) 1000 MG tablet, Take 1 tablet (1,000 mg total) by mouth 2 (two) times daily. (Patient not taking: Reported on 12/06/2024), Disp: 20 tablet, Rfl: 0  "

## 2024-12-06 NOTE — Progress Notes (Signed)
 "    Patient ID: Molly Hanson, female    DOB: [birth year], 69 y.o.   MRN: [id number]  Chief Complaint  Patient presents with   Pre-op Exam      ICD-10-CM   1. Malignant neoplasm of upper-outer quadrant of right breast in female, estrogen receptor positive (HCC)  C50.411    Z17.0        History of Present Illness: Molly Hanson is a 69 y.o.  female  with a history of macromastia.  She presents for preoperative evaluation for upcoming procedure, Bilateral Breast Reduction, scheduled for 12/13/2024 with Dr.  Lowery.  Of note, patient is undergoing right breast lumpectomy and sentinel lymph node biopsy with Dr. Curvin at the same time.  The patient has not had problems with anesthesia.  Patient denies any history of cardiac disease.  She denies taking any blood thinners.  Patient reports that she quit smoking 5 years ago.  Patient denies taking any birth control or hormone replacement.  She denies any history of greater than 3 miscarriages.  She denies any personal history of blood clots.  She reports that her grandmother had a blood clot, but she is unsure what kind.  She denies any personal family history of clotting diseases.  She denies any recent surgeries, traumas or infections.  She denies any history of stroke or heart attack.  She denies any history of Crohn's disease or ulcerative colitis, COPD or asthma.  She denies any varicosities to her lower extremities.  She denies any recent fevers, chills or changes in her health.  Summary of Previous Visit: Patient was seen for initial consult by Dr. Lowery on 10/20/2024.  At this visit, it was noted that patient was recently diagnosed with right breast cancer.  It was noted that patient had between a 40 and 42 D2 DD bra size.  Her STN was 36 cm on both sides.  Patient also did report some back and neck pain.  The patient requested oncoplastic breast reduction.  Job: Retired  PMH Significant for: Type 2 diabetes, hyperlipidemia, breast  cancer  Per chart review, patient's most recent A1c was 6.4 in October.  Patient states that she is having a sleep study done tonight.  I discussed with her that sleep apnea can put her at increased risk of complications with anesthesia.  Patient expressed understanding.  Patient's blood pressure is mildly elevated today.  She denies any recent headaches, changes in her vision, chest pain or shortness of breath.  Patient does state that she has a blood pressure cuff at home.  Recommended that she take her blood pressure when she goes home, if it remains elevated, she should call her PCP.  She expressed understanding.   Past Medical History: Allergies: Allergies[1]  Current Medications: Current Medications[2]  Past Medical Problems: Past Medical History:  Diagnosis Date   Arthritis    knees, arm    Hyperlipidemia    has been elevated in the past - no meds    Referred otalgia, right 05/01/2024   Vitamin D  deficiency     Past Surgical History: Past Surgical History:  Procedure Laterality Date   BREAST BIOPSY Right 10/02/2024   US  RT BREAST BX W LOC DEV 1ST LESION IMG BX SPEC US  GUIDE 10/02/2024 [facility]   COLONOSCOPY     > 10 yrs ago- normal    TUBAL LIGATION  1994    Social History: Social History   Socioeconomic History   Marital status: Single  Spouse name: Not on file   Number of children: Not on file   Years of education: Not on file   Highest education level: Associate degree: academic program  Occupational History   Not on file  Tobacco Use   Smoking status: Former    Current packs/day: 0.00    Types: Cigarettes    Quit date: 07/15/2020    Years since quitting: 4.3    Passive exposure: Past   Smokeless tobacco: Never  Vaping Use   Vaping status: Never Used  Substance and Sexual Activity   Alcohol use: Not Currently   Drug use: Never   Sexual activity: Yes  Other Topics Concern   Not on file  Social History Narrative   Are you right  handed or left handed? Right Handed    Are you currently employed ? No    What is your current occupation? Retired   Do you live at home alone? Yes   Who lives with you? No one    What type of home do you live in: 1 story or 2 story? Two story home       Social Drivers of Health   Tobacco Use: Medium Risk (12/05/2024)   Patient History    Smoking Tobacco Use: Former    Smokeless Tobacco Use: Never    Passive Exposure: Past  Physicist, Medical Strain: Low Risk (09/05/2024)   Overall Financial Resource Strain (CARDIA)    Difficulty of Paying Living Expenses: Not very hard  Food Insecurity: Food Insecurity Present (12/05/2024)   Epic    Worried About Programme Researcher, Broadcasting/film/video in the Last Year: Sometimes true    Ran Out of Food in the Last Year: Never true  Transportation Needs: No Transportation Needs (10/12/2024)   Epic    Lack of Transportation (Medical): No    Lack of Transportation (Non-Medical): No  Physical Activity: Insufficiently Active (09/05/2024)   Exercise Vital Sign    Days of Exercise per Week: 1 day    Minutes of Exercise per Session: 30 min  Stress: No Stress Concern Present (09/05/2024)   [name] - Occupational Stress Questionnaire    Feeling of Stress: Not at all  Social Connections: Moderately Integrated (09/05/2024)   Social Connection and Isolation Panel    Frequency of Communication with Friends and Family: Twice a week    Frequency of Social Gatherings with Friends and Family: Once a week    Attends Religious Services: More than 4 times per year    Active Member of Clubs or Organizations: Yes    Attends Banker Meetings: More than 4 times per year    Marital Status: Divorced  Intimate Partner Violence: Not At Risk (06/23/2024)   Epic    Fear of Current or Ex-Partner: No    Emotionally Abused: No    Physically Abused: No    Sexually Abused: No  Depression (PHQ2-9): Low Risk (11/24/2024)   Depression (PHQ2-9)     PHQ-2 Score: 0  Alcohol Screen: Low Risk (04/29/2024)   Alcohol Screen    Last Alcohol Screening Score (AUDIT): 1  Housing: Unknown (12/05/2024)   Epic    Unable to Pay for Housing in the Last Year: No    Number of Times Moved in the Last Year: Not on file    Homeless in the Last Year: No  Recent Concern: Housing - High Risk (10/12/2024)   Epic    Unable to Pay for Housing in the Last Year:  Yes    Number of Times Moved in the Last Year: 1    Homeless in the Last Year: No  Utilities: At Risk (10/12/2024)   Epic    Threatened with loss of utilities: Yes  Health Literacy: Adequate Health Literacy (02/09/2024)   B1300 Health Literacy    Frequency of need for help with medical instructions: Never    Family History: Family History  Problem Relation Age of Onset   Breast cancer Mother 62       passed age 66   Diabetes Father    Hypertension Father    Healthy Sister    Kidney failure Brother    Other Brother        prostate surgery   Colon polyps Maternal Uncle    Diabetes Maternal Grandmother    Stroke Maternal Grandmother    Colon polyps Maternal Grandmother    Colon cancer Maternal Grandfather    Diabetes Paternal Grandmother    Hypertension Son    Esophageal cancer Neg Hx    Rectal cancer Neg Hx    Stomach cancer Neg Hx     Review of Systems: Denies any recent fevers, chills or changes in her health  Physical Exam: Vital Signs BP (!) 141/86 (BP Location: Left Arm, Patient Position: Sitting, Cuff Size: Large)   Pulse 81   Wt 235 lb (106.6 kg)   SpO2 97%   BMI 39.11 kg/m   Physical Exam  Constitutional:      General: Not in acute distress.    Appearance: Normal appearance. Not ill-appearing.  HENT:     Head: Normocephalic and atraumatic.  Eyes:     Pupils: Pupils are equal, round Neck:     Musculoskeletal: Normal range of motion.  Cardiovascular:     Rate and Rhythm: Normal rate Pulmonary:     Effort: Pulmonary effort is normal. No respiratory distress.   Musculoskeletal: Normal range of motion.  Skin:    General: Skin is warm and dry.     Findings: No erythema or rash.  Neurological:     Mental Status: Alert and oriented to person, place, and time. Mental status is at baseline.  Psychiatric:        Mood and Affect: Mood normal.        Behavior: Behavior normal.    Assessment/Plan: The patient is scheduled for bilateral breast reduction with Dr. Lowery.  Risks, benefits, and alternatives of procedure discussed, questions answered and consent obtained.    Smoking Status: Former smoker, quit 5 years ago; Counseling Given?  N/A Last Mammogram: 09/27/2024, BI-RADS Category 4 suspicious.  Patient underwent biopsy and clip placement on 10/02/2024.  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 Recommendation for mechanical and possible pharmacological prophylaxis. Encourage early ambulation.  Will discuss possibility of postoperative Lovenox with Dr. Lowery.  Pictures obtained: @consult   Post-op Rx sent to pharmacy: Keflex , Zofran , tramadol   Instructed the patient to hold any multivitamins or supplements at least 1 week prior to surgery.  Patient reports she is not taking Mounjaro  anymore.  Discussed with patient to not take Xanax  at the same time as pain medications.  Patient expressed understanding.  Patient was provided with the breast reduction and General Surgical Risk consent document and Pain Medication Agreement prior to their appointment.  They had adequate time to read through the risk consent documents and Pain  Medication Agreement. We also discussed them in person together during this preop appointment. All of their questions were answered to their satisfaction.  Recommended calling if they have any further questions.  Risk consent form and Pain Medication Agreement to be  scanned into patient's chart.  The risk that can be encountered with breast reduction were discussed and include the following but not limited to these:  Breast asymmetry, fluid accumulation, firmness of the breast, inability to breast feed, loss of nipple or areola, skin loss, decrease or no nipple sensation, fat necrosis of the breast tissue, bleeding, infection, healing delay.  There are risks of anesthesia, changes to skin sensation and injury to nerves or blood vessels.  The muscle can be temporarily or permanently injured.  You may have an allergic reaction to tape, suture, glue, blood products which can result in skin discoloration, swelling, pain, skin lesions, poor healing.  Any of these can lead to the need for revisonal surgery or stage procedures.  A reduction has potential to interfere with diagnostic procedures.  Nipple or breast piercing can increase risks of infection.  This procedure is best done when the breast is fully developed.  Changes in the breast will continue to occur over time.  Pregnancy can alter the outcomes of previous breast reduction surgery, weight gain and weigh loss can also effect the long term appearance.   Patient had some questions today in regards to her breast cancer diagnosis. Recommended that she follow up with her oncologist.  All of her questions in regards to breast reduction surgery were answered to her satisfaction.  She expressed understanding.    Electronically signed by: Estefana FORBES Peck, PA-C 12/06/2024 10:53 AM      [1] No Known Allergies [2]  Current Outpatient Medications:    albuterol  (VENTOLIN  HFA) 108 (90 Base) MCG/ACT inhaler, Inhale 2 puffs into the lungs every 6 (six) hours as needed for wheezing or shortness of breath., Disp: 8 g, Rfl: 2   cephALEXin  (KEFLEX ) 500 MG capsule, Take 1 capsule (500 mg total) by mouth 4 (four) times daily for 3 days., Disp: 12 capsule, Rfl: 0   Multiple Vitamin (MULTIVITAMIN PO), Take by mouth daily., Disp: ,  Rfl:    ondansetron  (ZOFRAN ) 4 MG tablet, Take 1 tablet (4 mg total) by mouth every 8 (eight) hours as needed for up to 15 doses for nausea or vomiting., Disp: 15 tablet, Rfl: 0   polyethylene glycol powder (GLYCOLAX /MIRALAX ) 17 GM/SCOOP powder, Take 17 g by mouth 2 (two) times daily as needed. Dissolve 1 capful (17g) in 4-8 ounces of liquid and take by mouth daily., Disp: 510 g, Rfl: 0   rosuvastatin  (CRESTOR ) 10 MG tablet, Take 1 tablet (10 mg total) by mouth daily., Disp: 90 tablet, Rfl: 1   tirzepatide  (MOUNJARO ) 2.5 MG/0.5ML Pen, Inject 2.5 mg into the skin once a week., Disp: 2 mL, Rfl: 1   traMADol  (ULTRAM ) 50 MG tablet, Take 1 tablet (50 mg total) by mouth every 8 (eight) hours as needed for up to 15 doses for moderate pain (pain score 4-6) or severe pain (pain score 7-10)., Disp: 15 tablet, Rfl: 0   vitamin B-12 (CYANOCOBALAMIN ) 50 MCG tablet, Take 50 mcg by mouth daily., Disp: , Rfl:    VITAMIN D  PO, Take by mouth., Disp: , Rfl:    ALPRAZolam  (XANAX ) 0.5 MG tablet, Take 1 tablet (0.5 mg total) by mouth at bedtime as needed for sleep (premedication for the sleep test.). (Patient not taking: Reported on 12/06/2024),  Disp: 2 tablet, Rfl: 0   griseofulvin (GRIS-PEG) 125 MG tablet, Take 125 mg by mouth 2 (two) times daily. (Patient not taking: Reported on 12/06/2024), Disp: , Rfl:    ketoconazole (NIZORAL) 2 % cream, Apply topically as needed. (Patient not taking: Reported on 12/06/2024), Disp: , Rfl:    valACYclovir  (VALTREX ) 1000 MG tablet, Take 1 tablet (1,000 mg total) by mouth 2 (two) times daily. (Patient not taking: Reported on 12/06/2024), Disp: 20 tablet, Rfl: 0  "

## 2024-12-09 ENCOUNTER — Encounter: Payer: Self-pay | Admitting: Family Medicine

## 2024-12-11 ENCOUNTER — Encounter: Payer: Self-pay | Admitting: *Deleted

## 2024-12-11 DIAGNOSIS — C50411 Malignant neoplasm of upper-outer quadrant of right female breast: Secondary | ICD-10-CM

## 2024-12-11 NOTE — Progress Notes (Signed)
 Per navigation notes from med onc appt patient preferred to have rad onc at [facility] as closer to her home. I have left patient a message to verify such. Dr. Curvin / Dr. Lowery are doing surgery on her this week. Sent inbasket to Dr. Curvin confirming he wants Rad Onc consult placed.  Oncotype requested per Dr. Loretha. Will have this sent when path is back from 1/21 surgery.

## 2024-12-12 ENCOUNTER — Encounter (HOSPITAL_BASED_OUTPATIENT_CLINIC_OR_DEPARTMENT_OTHER): Payer: Self-pay | Admitting: General Surgery

## 2024-12-12 NOTE — Anesthesia Preprocedure Evaluation (Signed)
"                                    Anesthesia Evaluation  Patient identified by MRN, date of birth, ID band Patient awake    Reviewed: Allergy & Precautions, NPO status , Patient's Chart, lab work & pertinent test results  Airway Mallampati: II  TM Distance: >3 FB     Dental  (+) Missing, Dental Advisory Given   Pulmonary former smoker   Pulmonary exam normal breath sounds clear to auscultation       Cardiovascular negative cardio ROS Normal cardiovascular exam Rhythm:Regular Rate:Normal     Neuro/Psych negative neurological ROS  negative psych ROS   GI/Hepatic negative GI ROS, Neg liver ROS,,,  Endo/Other  diabetes, Well Controlled, Type 2  Right Breast Ca Obesity HLD  Renal/GU negative Renal ROS  negative genitourinary   Musculoskeletal  (+) Arthritis , Osteoarthritis,    Abdominal  (+) + obese  Peds  Hematology negative hematology ROS (+)   Anesthesia Other Findings   Reproductive/Obstetrics                              Anesthesia Physical Anesthesia Plan  ASA: 2  Anesthesia Plan: General   Post-op Pain Management: Dilaudid  IV, Precedex and Tylenol  PO (pre-op)*   Induction: Intravenous  PONV Risk Score and Plan: 4 or greater and Treatment may vary due to age or medical condition, Ondansetron , Dexamethasone  and Midazolam   Airway Management Planned: LMA  Additional Equipment: None  Intra-op Plan:   Post-operative Plan: Extubation in OR  Informed Consent: I have reviewed the patients History and Physical, chart, labs and discussed the procedure including the risks, benefits and alternatives for the proposed anesthesia with the patient or authorized representative who has indicated his/her understanding and acceptance.     Dental advisory given  Plan Discussed with: CRNA and Anesthesiologist  Anesthesia Plan Comments:          Anesthesia Quick Evaluation  "

## 2024-12-13 ENCOUNTER — Encounter (HOSPITAL_BASED_OUTPATIENT_CLINIC_OR_DEPARTMENT_OTHER): Payer: Self-pay | Admitting: General Surgery

## 2024-12-13 ENCOUNTER — Observation Stay (HOSPITAL_BASED_OUTPATIENT_CLINIC_OR_DEPARTMENT_OTHER)
Admission: RE | Admit: 2024-12-13 | Discharge: 2024-12-13 | Disposition: A | Discharge status: Home / Self Care | Attending: Plastic Surgery | Admitting: Plastic Surgery

## 2024-12-13 ENCOUNTER — Ambulatory Visit (HOSPITAL_COMMUNITY)
Admission: RE | Admit: 2024-12-13 | Discharge: 2024-12-13 | Disposition: A | Discharge status: Home / Self Care | Source: Ambulatory Visit | Attending: General Surgery | Admitting: General Surgery

## 2024-12-13 ENCOUNTER — Other Ambulatory Visit: Payer: Self-pay

## 2024-12-13 ENCOUNTER — Ambulatory Visit (HOSPITAL_BASED_OUTPATIENT_CLINIC_OR_DEPARTMENT_OTHER): Payer: Self-pay | Admitting: Anesthesiology

## 2024-12-13 ENCOUNTER — Encounter (HOSPITAL_BASED_OUTPATIENT_CLINIC_OR_DEPARTMENT_OTHER): Payer: Self-pay | Admitting: Anesthesiology

## 2024-12-13 ENCOUNTER — Encounter (HOSPITAL_BASED_OUTPATIENT_CLINIC_OR_DEPARTMENT_OTHER)
Admission: RE | Disposition: A | Discharge status: Home / Self Care | Payer: Self-pay | Source: Home / Self Care | Attending: General Surgery

## 2024-12-13 DIAGNOSIS — Z1721 Progesterone receptor positive status: Secondary | ICD-10-CM | POA: Insufficient documentation

## 2024-12-13 DIAGNOSIS — M542 Cervicalgia: Secondary | ICD-10-CM | POA: Diagnosis not present

## 2024-12-13 DIAGNOSIS — E785 Hyperlipidemia, unspecified: Secondary | ICD-10-CM | POA: Insufficient documentation

## 2024-12-13 DIAGNOSIS — Z170 Estrogen receptor positive status [ER+]: Secondary | ICD-10-CM

## 2024-12-13 DIAGNOSIS — C50411 Malignant neoplasm of upper-outer quadrant of right female breast: Principal | ICD-10-CM | POA: Insufficient documentation

## 2024-12-13 DIAGNOSIS — Z1732 Human epidermal growth factor receptor 2 negative status: Secondary | ICD-10-CM | POA: Insufficient documentation

## 2024-12-13 DIAGNOSIS — Z5941 Food insecurity: Secondary | ICD-10-CM | POA: Insufficient documentation

## 2024-12-13 DIAGNOSIS — M1990 Unspecified osteoarthritis, unspecified site: Secondary | ICD-10-CM | POA: Diagnosis not present

## 2024-12-13 DIAGNOSIS — Z87891 Personal history of nicotine dependence: Secondary | ICD-10-CM | POA: Insufficient documentation

## 2024-12-13 DIAGNOSIS — N62 Hypertrophy of breast: Secondary | ICD-10-CM | POA: Diagnosis not present

## 2024-12-13 DIAGNOSIS — Z5989 Other problems related to housing and economic circumstances: Secondary | ICD-10-CM | POA: Diagnosis not present

## 2024-12-13 DIAGNOSIS — M549 Dorsalgia, unspecified: Secondary | ICD-10-CM

## 2024-12-13 DIAGNOSIS — E119 Type 2 diabetes mellitus without complications: Secondary | ICD-10-CM | POA: Insufficient documentation

## 2024-12-13 DIAGNOSIS — Z01818 Encounter for other preprocedural examination: Secondary | ICD-10-CM

## 2024-12-13 DIAGNOSIS — C50919 Malignant neoplasm of unspecified site of unspecified female breast: Secondary | ICD-10-CM | POA: Diagnosis present

## 2024-12-13 HISTORY — PX: BREAST LUMPECTOMY: SHX2

## 2024-12-13 HISTORY — PX: BREAST REDUCTION SURGERY: SHX8

## 2024-12-13 HISTORY — PX: SENTINEL NODE BIOPSY: SHX6608

## 2024-12-13 LAB — GLUCOSE, CAPILLARY
Glucose-Capillary: 114 mg/dL — ABNORMAL HIGH (ref 70–99)
Glucose-Capillary: 108 mg/dL — ABNORMAL HIGH (ref 70–99)

## 2024-12-13 MED ORDER — OXYCODONE HCL 5 MG PO TABS: 5.0000 mg | ORAL_TABLET | Freq: Once | ORAL | Status: DC | PRN

## 2024-12-13 MED ORDER — FENTANYL CITRATE (PF) 100 MCG/2ML IJ SOLN
25.0000 ug | INTRAMUSCULAR | Status: DC | PRN
  Administered 2024-12-13 (×2): 50 ug via INTRAVENOUS

## 2024-12-13 MED ORDER — CEFAZOLIN SODIUM-DEXTROSE 2-4 GM/100ML-% IV SOLN
2.0000 g | INTRAVENOUS | Status: AC
  Administered 2024-12-13: 2 g via INTRAVENOUS

## 2024-12-13 MED ORDER — BUPIVACAINE HCL (PF) 0.25 % IJ SOLN
INTRAMUSCULAR | Status: AC
  Filled 2024-12-13: qty 90

## 2024-12-13 MED ORDER — EPINEPHRINE PF 1 MG/ML IJ SOLN
INTRAMUSCULAR | Status: AC
  Filled 2024-12-13: qty 1

## 2024-12-13 MED ORDER — MIDAZOLAM HCL (PF) 2 MG/2ML IJ SOLN
2.0000 mg | Freq: Once | INTRAMUSCULAR | Status: AC
  Administered 2024-12-13: 2 mg via INTRAVENOUS

## 2024-12-13 MED ORDER — TECHNETIUM TC 99M TILMANOCEPT KIT
1.0000 | PACK | Freq: Once | INTRAVENOUS | Status: AC | PRN
  Administered 2024-12-13: 1.1 via INTRADERMAL

## 2024-12-13 MED ORDER — BUPIVACAINE LIPOSOME 1.3 % IJ SUSP
INTRAMUSCULAR | Status: AC
  Filled 2024-12-13: qty 20

## 2024-12-13 MED ORDER — FENTANYL CITRATE (PF) 100 MCG/2ML IJ SOLN
INTRAMUSCULAR | Status: AC
  Filled 2024-12-13: qty 2

## 2024-12-13 MED ORDER — CEFAZOLIN SODIUM-DEXTROSE 2-4 GM/100ML-% IV SOLN
INTRAVENOUS | Status: AC
  Filled 2024-12-13: qty 100

## 2024-12-13 MED ORDER — CHLORHEXIDINE GLUCONATE CLOTH 2 % EX PADS: 6.0000 | MEDICATED_PAD | Freq: Once | CUTANEOUS | Status: DC

## 2024-12-13 MED ORDER — BUPIVACAINE LIPOSOME 1.3 % IJ SUSP
INTRAMUSCULAR | Status: AC
  Filled 2024-12-13: qty 10

## 2024-12-13 MED ORDER — BUPIVACAINE HCL (PF) 0.5 % IJ SOLN
INTRAMUSCULAR | Status: DC | PRN
  Administered 2024-12-13: 30 mL via PERINEURAL

## 2024-12-13 MED ORDER — PROPOFOL 10 MG/ML IV BOLUS
INTRAVENOUS | Status: AC
  Filled 2024-12-13: qty 20

## 2024-12-13 MED ORDER — ROCURONIUM BROMIDE 10 MG/ML (PF) SYRINGE
PREFILLED_SYRINGE | INTRAVENOUS | Status: AC
  Filled 2024-12-13: qty 10

## 2024-12-13 MED ORDER — OXYCODONE HCL 5 MG PO TABS: 5.0000 mg | ORAL_TABLET | ORAL | Status: DC | PRN

## 2024-12-13 MED ORDER — LABETALOL HCL 5 MG/ML IV SOLN
INTRAVENOUS | Status: AC
  Filled 2024-12-13: qty 4

## 2024-12-13 MED ORDER — PHENYLEPHRINE HCL (PRESSORS) 10 MG/ML IV SOLN
INTRAVENOUS | Status: AC
  Filled 2024-12-13: qty 1

## 2024-12-13 MED ORDER — KCL IN DEXTROSE-NACL 10-5-0.45 MEQ/L-%-% IV SOLN: INTRAVENOUS | Status: DC

## 2024-12-13 MED ORDER — TRANEXAMIC ACID-NACL 1000-0.7 MG/100ML-% IV SOLN
INTRAVENOUS | Status: DC | PRN
  Administered 2024-12-13: 1000 mg via INTRAVENOUS

## 2024-12-13 MED ORDER — GABAPENTIN 100 MG PO CAPS
ORAL_CAPSULE | ORAL | Status: AC
  Filled 2024-12-13: qty 1

## 2024-12-13 MED ORDER — DIPHENHYDRAMINE HCL 12.5 MG/5ML PO ELIX: 12.5000 mg | ORAL_SOLUTION | Freq: Four times a day (QID) | ORAL | Status: DC | PRN

## 2024-12-13 MED ORDER — SENNA 8.6 MG PO TABS: 1.0000 | ORAL_TABLET | Freq: Two times a day (BID) | ORAL | Status: DC

## 2024-12-13 MED ORDER — LIDOCAINE 2% (20 MG/ML) 5 ML SYRINGE
INTRAMUSCULAR | Status: DC | PRN
  Administered 2024-12-13: 80 mg via INTRAVENOUS

## 2024-12-13 MED ORDER — DIPHENHYDRAMINE HCL 50 MG/ML IJ SOLN: 12.5000 mg | Freq: Four times a day (QID) | INTRAMUSCULAR | Status: DC | PRN

## 2024-12-13 MED ORDER — SODIUM CHLORIDE 0.9 % IV SOLN: 250.0000 mL | INTRAVENOUS | Status: DC | PRN

## 2024-12-13 MED ORDER — PHENYLEPHRINE HCL (PRESSORS) 10 MG/ML IV SOLN
INTRAVENOUS | Status: DC | PRN
  Administered 2024-12-13 (×2): 180 ug via INTRAVENOUS
  Administered 2024-12-13 (×2): 80 ug via INTRAVENOUS

## 2024-12-13 MED ORDER — GLYCOPYRROLATE 0.2 MG/ML IJ SOLN
INTRAMUSCULAR | Status: DC | PRN
  Administered 2024-12-13: .1 mg via INTRAVENOUS

## 2024-12-13 MED ORDER — LIDOCAINE HCL 1 % IJ SOLN
INTRAVENOUS | Status: DC | PRN
  Administered 2024-12-13: 400 mL

## 2024-12-13 MED ORDER — MIDAZOLAM HCL 2 MG/2ML IJ SOLN
INTRAMUSCULAR | Status: AC
  Filled 2024-12-13: qty 2

## 2024-12-13 MED ORDER — ONDANSETRON HCL 4 MG/2ML IJ SOLN
INTRAMUSCULAR | Status: DC | PRN
  Administered 2024-12-13: 4 mg via INTRAVENOUS

## 2024-12-13 MED ORDER — LIDOCAINE HCL (PF) 1 % IJ SOLN
INTRAMUSCULAR | Status: AC
  Filled 2024-12-13: qty 60

## 2024-12-13 MED ORDER — ROCURONIUM BROMIDE 100 MG/10ML IV SOLN
INTRAVENOUS | Status: DC | PRN
  Administered 2024-12-13: 60 mg via INTRAVENOUS

## 2024-12-13 MED ORDER — ONDANSETRON HCL 4 MG/2ML IJ SOLN
INTRAMUSCULAR | Status: AC
  Filled 2024-12-13: qty 2

## 2024-12-13 MED ORDER — ONDANSETRON HCL 4 MG/2ML IJ SOLN: 4.0000 mg | Freq: Once | INTRAMUSCULAR | Status: DC | PRN

## 2024-12-13 MED ORDER — SODIUM CHLORIDE 0.9% FLUSH: 3.0000 mL | INTRAVENOUS | Status: DC | PRN

## 2024-12-13 MED ORDER — SUGAMMADEX SODIUM 200 MG/2ML IV SOLN
INTRAVENOUS | Status: DC | PRN
  Administered 2024-12-13: 300 mg via INTRAVENOUS

## 2024-12-13 MED ORDER — PROPOFOL 10 MG/ML IV BOLUS
INTRAVENOUS | Status: DC | PRN
  Administered 2024-12-13: 200 mg via INTRAVENOUS

## 2024-12-13 MED ORDER — ACETAMINOPHEN 325 MG PO TABS: 325.0000 mg | ORAL_TABLET | Freq: Four times a day (QID) | ORAL | Status: DC

## 2024-12-13 MED ORDER — HYDROMORPHONE HCL 1 MG/ML IJ SOLN: 1.0000 mg | INTRAMUSCULAR | Status: DC | PRN

## 2024-12-13 MED ORDER — ACETAMINOPHEN 500 MG PO TABS
ORAL_TABLET | ORAL | Status: AC
  Filled 2024-12-13: qty 2

## 2024-12-13 MED ORDER — VASHE WOUND IRRIGATION OPTIME
TOPICAL | Status: DC | PRN
  Administered 2024-12-13: 34 [oz_av]

## 2024-12-13 MED ORDER — GABAPENTIN 100 MG PO CAPS
100.0000 mg | ORAL_CAPSULE | ORAL | Status: AC
  Administered 2024-12-13: 100 mg via ORAL

## 2024-12-13 MED ORDER — DEXAMETHASONE SOD PHOSPHATE PF 10 MG/ML IJ SOLN
INTRAMUSCULAR | Status: DC | PRN
  Administered 2024-12-13: 5 mg via INTRAVENOUS

## 2024-12-13 MED ORDER — DROPERIDOL 2.5 MG/ML IJ SOLN
0.6250 mg | Freq: Once | INTRAMUSCULAR | Status: AC | PRN
  Administered 2024-12-13: 0.625 mg via INTRAVENOUS

## 2024-12-13 MED ORDER — PHENYLEPHRINE 80 MCG/ML (10ML) SYRINGE FOR IV PUSH (FOR BLOOD PRESSURE SUPPORT)
PREFILLED_SYRINGE | INTRAVENOUS | Status: AC
  Filled 2024-12-13: qty 10

## 2024-12-13 MED ORDER — FENTANYL CITRATE (PF) 100 MCG/2ML IJ SOLN
50.0000 ug | Freq: Once | INTRAMUSCULAR | Status: AC
  Administered 2024-12-13: 50 ug via INTRAVENOUS

## 2024-12-13 MED ORDER — CHLORHEXIDINE GLUCONATE CLOTH 2 % EX PADS
6.0000 | MEDICATED_PAD | Freq: Once | CUTANEOUS | Status: AC
  Administered 2024-12-13: 6 via TOPICAL

## 2024-12-13 MED ORDER — ZOLPIDEM TARTRATE 5 MG PO TABS: 5.0000 mg | ORAL_TABLET | Freq: Every evening | ORAL | Status: DC | PRN

## 2024-12-13 MED ORDER — ONDANSETRON 4 MG PO TBDP: 4.0000 mg | ORAL_TABLET | Freq: Once | ORAL | Status: DC

## 2024-12-13 MED ORDER — OXYCODONE HCL 5 MG/5ML PO SOLN: 5.0000 mg | Freq: Once | ORAL | Status: DC | PRN

## 2024-12-13 MED ORDER — ACETAMINOPHEN 500 MG PO TABS
1000.0000 mg | ORAL_TABLET | ORAL | Status: AC
  Administered 2024-12-13: 1000 mg via ORAL

## 2024-12-13 MED ORDER — PHENYLEPHRINE HCL-NACL 20-0.9 MG/250ML-% IV SOLN
INTRAVENOUS | Status: DC | PRN
  Administered 2024-12-13: 25 ug/min via INTRAVENOUS

## 2024-12-13 MED ORDER — POLYETHYLENE GLYCOL 3350 17 G PO PACK: 17.0000 g | PACK | Freq: Every day | ORAL | Status: DC | PRN

## 2024-12-13 MED ORDER — DEXAMETHASONE SOD PHOSPHATE PF 10 MG/ML IJ SOLN
INTRAMUSCULAR | Status: AC
  Filled 2024-12-13: qty 1

## 2024-12-13 MED ORDER — BUPIVACAINE LIPOSOME 1.3 % IJ SUSP
INTRAMUSCULAR | Status: DC | PRN
  Administered 2024-12-13: 40 mL

## 2024-12-13 MED ORDER — FENTANYL CITRATE (PF) 100 MCG/2ML IJ SOLN
INTRAMUSCULAR | Status: DC | PRN
  Administered 2024-12-13: 100 ug via INTRAVENOUS
  Administered 2024-12-13: 50 ug via INTRAVENOUS

## 2024-12-13 MED ORDER — LIDOCAINE 2% (20 MG/ML) 5 ML SYRINGE
INTRAMUSCULAR | Status: AC
  Filled 2024-12-13: qty 5

## 2024-12-13 MED ORDER — LIDOCAINE-EPINEPHRINE 1 %-1:100000 IJ SOLN
INTRAMUSCULAR | Status: DC | PRN
  Administered 2024-12-13: 20 mL

## 2024-12-13 MED ORDER — CEFAZOLIN SODIUM-DEXTROSE 2-4 GM/100ML-% IV SOLN: 2.0000 g | INTRAVENOUS | Status: DC

## 2024-12-13 MED ORDER — EPHEDRINE SULFATE (PRESSORS) 25 MG/5ML IV SOSY
PREFILLED_SYRINGE | INTRAVENOUS | Status: DC | PRN
  Administered 2024-12-13 (×2): 10 mg via INTRAVENOUS

## 2024-12-13 MED ORDER — IBUPROFEN 200 MG PO TABS: 400.0000 mg | ORAL_TABLET | Freq: Four times a day (QID) | ORAL | Status: DC

## 2024-12-13 MED ORDER — SODIUM CHLORIDE 0.9% FLUSH: 3.0000 mL | Freq: Two times a day (BID) | INTRAVENOUS | Status: DC

## 2024-12-13 MED ORDER — DROPERIDOL 2.5 MG/ML IJ SOLN
INTRAMUSCULAR | Status: AC
  Filled 2024-12-13: qty 2

## 2024-12-13 MED ORDER — EPHEDRINE 5 MG/ML INJ
INTRAVENOUS | Status: AC
  Filled 2024-12-13: qty 5

## 2024-12-13 MED ORDER — LACTATED RINGERS IV SOLN: INTRAVENOUS | Status: DC

## 2024-12-13 MED ORDER — CEFAZOLIN SODIUM-DEXTROSE 2-4 GM/100ML-% IV SOLN: 2.0000 g | Freq: Three times a day (TID) | INTRAVENOUS | Status: DC

## 2024-12-13 MED ORDER — NITROGLYCERIN 2 % TD OINT
TOPICAL_OINTMENT | TRANSDERMAL | Status: AC
  Filled 2024-12-13: qty 30

## 2024-12-13 NOTE — Interval H&P Note (Signed)
 History and Physical Interval Note:  12/13/2024 7:40 AM  Reena JONETTA Corporal  has presented today for surgery, with the diagnosis of RIGHT BREAST CANCER.  The various methods of treatment have been discussed with the patient and family. After consideration of risks, benefits and other options for treatment, the patient has consented to  Procedures: BREAST LUMPECTOMY (Right) BIOPSY, LYMPH NODE, SENTINEL (Right) MAMMOPLASTY, REDUCTION (Bilateral) as a surgical intervention.  The patient's history has been reviewed, patient examined, no change in status, stable for surgery.  I have reviewed the patient's chart and labs.  Questions were answered to the patient's satisfaction.     Estefana RAMAN Madhavi Hamblen

## 2024-12-13 NOTE — Anesthesia Procedure Notes (Signed)
 Anesthesia Regional Block: Pectoralis block   Pre-Anesthetic Checklist: , timeout performed,  Correct Patient, Correct Site, Correct Laterality,  Correct Procedure, Correct Position, site marked,  Risks and benefits discussed,  Pre-op evaluation,  At surgeon's request and post-op pain management  Laterality: Right  Prep: chloraprep       Needles:  Injection technique: Single-shot  Needle Type: Echogenic Stimulator Needle     Needle Length: 10cm  Needle Gauge: 21   Needle insertion depth (cm): 8   Additional Needles:   Procedures:,,,, ultrasound used (permanent image in chart),,    Narrative:  Start time: 12/13/2024 8:08 AM End time: 12/13/2024 8:13 AM  Performed by: Personally  Anesthesiologist: Jerrye Sharper, MD  Additional Notes: Timeout performed. Patient sedated. Relevant anatomy ID'd using US . Incremental 2-5ml injection of LA with frequent aspiration. Patient tolerated procedure well.

## 2024-12-13 NOTE — Transfer of Care (Signed)
 Immediate Anesthesia Transfer of Care Note  Patient: Molly Hanson  Procedure(s) Performed: BREAST LUMPECTOMY (Right: Breast) BIOPSY, LYMPH NODE, SENTINEL (Right: Axilla) MAMMOPLASTY, REDUCTION (Bilateral: Breast)  Patient Location: PACU  Anesthesia Type:General  Level of Consciousness: drowsy  Airway & Oxygen Therapy: Patient Spontanous Breathing and Patient connected to face mask oxygen  Post-op Assessment: Report given to RN and Post -op Vital signs reviewed and stable  Post vital signs: Reviewed and stable  Last Vitals:  Vitals Value Taken Time  BP 140/88 12/13/24 11:46  Temp    Pulse 88 12/13/24 11:48  Resp 11 12/13/24 11:47  SpO2 99 % 12/13/24 11:48  Vitals shown include unfiled device data.  Last Pain:  Vitals:   12/13/24 0715  TempSrc: Temporal  PainSc: 0-No pain         Complications: No notable events documented.

## 2024-12-13 NOTE — H&P (Signed)
 " REFERRING PHYSICIAN: Lendia Boby CROME, NP PROVIDER: DEWARD GARNETTE NULL, MD MRN: [id number] DOB: Feb 06, 1956 Subjective   Chief Complaint: NEW BREAST CANCER  History of Present Illness: Molly Hanson is a 69 y.o. female who is seen today as an office consultation for evaluation of NEW BREAST CANCER  We are asked to see the patient in consultation by Dr. Orie Lendia to evaluate her for a new right breast cancer. The patient is a 69 year old black female who recently felt a mass in the upper outer quadrant of the right breast. She went to her medical doctor and was evaluated with mammogram and ultrasound. She was found to have a 3 cm mass in the upper outer quadrant of the right breast very superficial to the skin. The axilla looked normal. The mass was biopsied and came back as a grade 2 invasive ductal cancer that was ER and PR positive and HER2 negative with a Ki-67 of 10%. She has no family history of breast cancer. She is otherwise in good health and does not smoke  Review of Systems: A complete review of systems was obtained from the patient. I have reviewed this information and discussed as appropriate with the patient. See HPI as well for other ROS.  ROS   Medical History: Past Medical History:  Diagnosis Date  Arthritis   Patient Active Problem List  Diagnosis  Malignant neoplasm of upper-outer quadrant of right breast in female, estrogen receptor positive (CMS/HHS-HCC)   History reviewed. No pertinent surgical history.   No Known Allergies  No current outpatient medications on file prior to visit.   No current facility-administered medications on file prior to visit.   Family History  Problem Relation Age of Onset  High blood pressure (Hypertension) Father  Diabetes Father    Social History   Tobacco Use  Smoking Status Former  Types: Cigarettes  Start date: 2021  Smokeless Tobacco Never    Social History   Socioeconomic History  Marital status: Single   Tobacco Use  Smoking status: Former  Types: Cigarettes  Start date: 2021  Smokeless tobacco: Never  Substance and Sexual Activity  Alcohol use: Never  Drug use: Never   Social Drivers of Corporate Investment Banker Strain: Low Risk (09/05/2024)  Received from [facility]  Overall Financial Resource Strain (CARDIA)  How hard is it for you to pay for the very basics like food, housing, medical care, and heating?: Not very hard  Food Insecurity: Food Insecurity Present (09/05/2024)  Received from [facility]  Hunger Vital Sign  Within the past 12 months, you worried that your food would run out before you got the money to buy more.: Sometimes true  Within the past 12 months, the food you bought just didn't last and you didn't have money to get more.: Sometimes true  Transportation Needs: No Transportation Needs (09/05/2024)  Received from [facility] - Transportation  In the past 12 months, has lack of transportation kept you from medical appointments or from getting medications?: No  In the past 12 months, has lack of transportation kept you from meetings, work, or from getting things needed for daily living?: No  Physical Activity: Insufficiently Active (09/05/2024)  Received from [facility]  Exercise Vital Sign  On average, how many days per week do you engage in moderate to strenuous exercise (like a brisk walk)?: 1 day  On average, how many minutes do you engage in exercise at this level?: 30 min  Stress: No  Stress Concern Present (09/05/2024)  Received from [facility] - Occupational Stress Questionnaire  Do you feel stress - tense, restless, nervous, or anxious, or unable to sleep at night because your mind is troubled all the time - these days?: Not at all  Social Connections: Moderately Integrated (09/05/2024)  Received from [facility]  Social Connection and Isolation Panel  In a typical week, how many times do you talk  on the phone with family, friends, or neighbors?: Twice a week  How often do you get together with friends or relatives?: Once a week  How often do you attend church or religious services?: More than 4 times per year  Do you belong to any clubs or organizations such as church groups, unions, fraternal or athletic groups, or school groups?: Yes  How often do you attend meetings of the clubs or organizations you belong to?: More than 4 times per year  Are you married, widowed, divorced, separated, never married, or living with a partner?: Divorced  Housing Stability: Unknown (10/10/2024)  Housing Stability Vital Sign  Homeless in the Last Year: No   Objective:   Vitals:  BP: (!) 140/82  Pulse: 83  Temp: 36.7 C (98 F)  SpO2: 98%  Weight: (!) 105.7 kg (233 lb)  Height: 166.4 cm (5' 5.5)  PainSc: 0-No pain   Body mass index is 38.18 kg/m.  Physical Exam Vitals reviewed.  Constitutional:  General: She is not in acute distress. Appearance: Normal appearance.  HENT:  Head: Normocephalic and atraumatic.  Right Ear: External ear normal.  Left Ear: External ear normal.  Nose: Nose normal.  Mouth/Throat:  Mouth: Mucous membranes are moist.  Pharynx: Oropharynx is clear.  Eyes:  General: No scleral icterus. Extraocular Movements: Extraocular movements intact.  Conjunctiva/sclera: Conjunctivae normal.  Pupils: Pupils are equal, round, and reactive to light.  Cardiovascular:  Rate and Rhythm: Normal rate and regular rhythm.  Pulses: Normal pulses.  Heart sounds: Normal heart sounds.  Pulmonary:  Effort: Pulmonary effort is normal. No respiratory distress.  Breath sounds: Normal breath sounds.  Abdominal:  General: Bowel sounds are normal.  Palpations: Abdomen is soft.  Tenderness: There is no abdominal tenderness.  Musculoskeletal:  General: No swelling, tenderness or deformity. Normal range of motion.  Cervical back: Normal range of motion and neck supple.   Skin: General: Skin is warm and dry.  Coloration: Skin is not jaundiced.  Neurological:  General: No focal deficit present.  Mental Status: She is alert and oriented to person, place, and time.  Psychiatric:  Mood and Affect: Mood normal.  Behavior: Behavior normal.     Breast: There is a roughly 3 cm palpable mass in the upper outer quadrant of the right breast very superficial to the skin. There is no palpable mass in the left breast. There is no palpable axillary, supraclavicular, or cervical lymphadenopathy.  Labs, Imaging and Diagnostic Testing:  Assessment and Plan:   Diagnoses and all orders for this visit:  Malignant neoplasm of upper-outer quadrant of right breast in female, estrogen receptor positive (CMS/HHS-HCC) - Ambulatory Referral to Plastic Surgery - [facility] Case Posting Request; Future   The patient has a 3 cm cancer in the upper outer quadrant of the right breast with clinically negative nodes and all favorable markers. I have discussed with her in detail the different options for treatment and at this point she is undecided but thinking about possible reduction lumpectomy. She will be a good candidate for  sentinel node biopsy as well. She will meet with medical and radiation oncology this Friday. I will refer her to plastic surgery to talk about the possibility of reduction lumpectomy. I will also refer her to physical therapy for preoperative lymphedema testing. She will let us  know once she has made her final decision  "

## 2024-12-13 NOTE — Anesthesia Procedure Notes (Signed)
 Procedure Name: Intubation Date/Time: 12/13/2024 8:47 AM  Performed by: Debarah Chiquita LABOR, CRNAPre-anesthesia Checklist: Patient identified, Emergency Drugs available, Suction available and Patient being monitored Patient Re-evaluated:Patient Re-evaluated prior to induction Oxygen Delivery Method: Circle system utilized Preoxygenation: Pre-oxygenation with 100% oxygen Induction Type: IV induction Ventilation: Mask ventilation without difficulty Laryngoscope Size: Mac and 3 Grade View: Grade I Tube type: Oral Tube size: 7.0 mm Number of attempts: 1 Airway Equipment and Method: Stylet and Bite block Placement Confirmation: ETT inserted through vocal cords under direct vision, positive ETCO2 and breath sounds checked- equal and bilateral Secured at: 22 cm Tube secured with: Tape Dental Injury: Teeth and Oropharynx as per pre-operative assessment

## 2024-12-13 NOTE — Progress Notes (Signed)
 Assisted Dr. Malen Gauze with right, pectoralis, ultrasound guided block. Side rails up, monitors on throughout procedure. See vital signs in flow sheet. Tolerated Procedure well.

## 2024-12-13 NOTE — Interval H&P Note (Signed)
 History and Physical Interval Note:  12/13/2024 7:45 AM  Molly Hanson Corporal  has presented today for surgery, with the diagnosis of RIGHT BREAST CANCER.  The various methods of treatment have been discussed with the patient and family. After consideration of risks, benefits and other options for treatment, the patient has consented to  Procedures: BREAST LUMPECTOMY (Right) BIOPSY, LYMPH NODE, SENTINEL (Right) MAMMOPLASTY, REDUCTION (Bilateral) as a surgical intervention.  The patient's history has been reviewed, patient examined, no change in status, stable for surgery.  I have reviewed the patient's chart and labs.  Questions were answered to the patient's satisfaction.     [name]

## 2024-12-13 NOTE — Op Note (Addendum)
 Oncoplastic Breast Reduction Op note:    DATE OF PROCEDURE: 12/13/2024  LOCATION: [facility]  SURGEON: Estefana Fritter, DO  PREOPERATIVE DIAGNOSIS 1. Right Breast Cancer 2. Macromastia, Neck Pain and Back Pain  POSTOPERATIVE DIAGNOSIS Same as preoperative diagnosis  PROCEDURES 1. Bilateral breast reduction.  Right reduction 450 g, Left reduction 777 g  COMPLICATIONS: None.  DRAINS: none  INDICATIONS FOR PROCEDURE Molly Hanson is a 69 y.o. year-old female born on 1956/01/02,with a history of symptomatic macromastia with concomitant back pain, neck pain, shoulder grooving from her bra.   MRN: [id number]  CONSENT Informed consent was obtained directly from the patient. The risks, benefits and alternatives were fully discussed. Specific risks including but not limited to bleeding, infection, hematoma, seroma, scarring, pain, nipple necrosis, asymmetry, poor cosmetic results, and need for further surgery were discussed. The patient's questions were answered.   DESCRIPTION OF PROCEDURE  Patient was brought into the operating room and rested on the operating room table in the supine position.  SCDs were placed and appropriate padding was performed.  Antibiotics were given. The patient underwent general anesthesia and the chest was prepped and draped in a sterile fashion.  A timeout was performed and all information was confirmed to be correct by those in the room.A small amount of tumescent was placed in the lateral lower right breast.  Liposuction was done.  The usual tumescent and liposuction was done on the left side.  Right side: I assisted general surgery for their portion of the case for the right partial mastectomy.  Once they were finished I began the breast reduction. Preoperative markings were confirmed.  The marked lines were incised with a #15 blade.  A Wise-pattern superomedial breast reduction was performed by de-epithelializing the pedicle, using  bovie to create the superomedial pedicle, and removing breast tissue from the lateral and inferior portions of the breast.  The lateral portion of the pedicle was sent separately as it was adjacent to the mastectomy site. Care was taken to not undermine the breast pedicle. Hemostasis was achieved. Experel and Myriad were placed in the pocket. The nipple was gently rotated into position and the soft tissue closed with 4-0 Monocryl.   The pocket was irrigated and hemostasis confirmed.  The deep tissues were approximated with 3-0 PDS sutures.  The skin was closed with deep dermal 3-0 Monocryl and subcuticular 4-0 Monocryl sutures.  The nipple and skin flaps had good capillary refill at the end of the procedure.    Left side: Preoperative markings were confirmed.  Incision lines were injected with local containing epinephrine .  After waiting for vasoconstriction, the marked lines were incised with a #15 blade.  A Wise-pattern superomedial breast reduction was performed by de-epithelializing the pedicle, using bovie to create the superomedial pedicle, and removing breast tissue from the superior, lateral, and inferior portions of the breast.  Care was taken to not undermine the breast pedicle. Hemostasis was achieved.  The nipple was gently rotated into position and the soft tissue was closed with 4-0 Monocryl.  Experel and myriad were placed in the pocket. The patient was sat upright and size and shape symmetry was confirmed.  The pocket was irrigated and hemostasis confirmed.  The deep tissues were approximated with 3-0 PDS sutures. The skin was closed with deep dermal 3-0 Monocryl and subcuticular 4-0 Monocryl sutures.  Dermabond was applied.  A breast binder and ABDs were placed.  The nipple and skin flaps had good capillary refill at the end  of the procedure.  The patient tolerated the procedure well. The patient was allowed to wake from anesthesia and taken to the recovery room in satisfactory condition.

## 2024-12-13 NOTE — Discharge Instructions (Addendum)
 " No tylenol  before 1:30 pm. Take one dose of antibiotic this afternoon and another at bedtime tonight. Then take 4 times per day, approx every 6 hours. Be sure to finish the bottle of antibiotics. Take ondansetron  after 9pm if needed for nausea.   INSTRUCTIONS FOR AFTER BREAST SURGERY   You will likely have some questions about what to expect following your operation.  The following information will help you and your family understand what to expect when you are discharged from the hospital.  It is important to follow these guidelines to help ensure a smooth recovery and reduce complication.  Postoperative instructions include information on: diet, wound care, medications and physical activity.  AFTER SURGERY Expect to go home after the procedure.  In some cases, you may need to spend one night in the hospital for observation.  DIET Breast surgery does not require a specific diet.  However, the healthier you eat the better your body will heal. It is important to increasing your protein intake.  This means limiting the foods with sugar and carbohydrates.  Focus on vegetables and some meat.  If you have liposuction during your procedure be sure to drink water.  If your urine is bright yellow, then it is concentrated, and you need to drink more water.  As a general rule after surgery, you should have 8 ounces of water every hour while awake.  If you find you are persistently nauseated or unable to take in liquids let us  know.  NO TOBACCO USE or EXPOSURE.  This will slow your healing process and lead to a wound.  WOUND CARE Leave the binder on for 3 days . Use fragrance free soap like Dial, Dove or Ivory.   After 3 days you can remove the binder to shower. Once dry apply binder or sports bra. If you have liposuction you will have a soft and spongy dressing (Lipofoam) that helps prevent creases in your skin.  Remove before you shower and then replace it.  It is also available on [employer]. If you have  steri-strips / tape directly attached to your skin leave them in place. It is OK to get these wet.   No baths, pools or hot tubs for four weeks. We close your incision to leave the smallest and best-looking scar. No ointment or creams on your incisions for four weeks.  No Neosporin (Too many skin reactions).  A few weeks after surgery you can use Mederma and start massaging the scar. We ask you to wear your binder or sports bra for the first 6 weeks around the clock, including while sleeping. This provides added comfort and helps reduce the fluid accumulation at the surgery site. NO Ice or heating pads to the operative site.  You have a very high risk of a BURN before you feel the temperature change.  ACTIVITY No heavy lifting until cleared by the doctor.  This usually means no more than a half-gallon of milk.  It is OK to walk and climb stairs. Moving your legs is very important to decrease your risk of a blood clot.  It will also help keep you from getting deconditioned.  Every 1 to 2 hours get up and walk for 5 minutes. This will help with a quicker recovery back to normal.  Let pain be your guide so you don't do too much.  This time is for you to recover.  You will be more comfortable if you sleep and rest with your head elevated either with  a few pillows under you or in a recliner.  No stomach sleeping for a three months.  WORK Everyone returns to work at different times. As a rough guide, most people take at least 1 - 2 weeks off prior to returning to work. If you need documentation for your job, give the forms to the front staff at the clinic.  DRIVING Arrange for someone to bring you home from the hospital after your surgery.  You may be able to drive a few days after surgery but not while taking any narcotics or valium.  BOWEL MOVEMENTS Constipation can occur after anesthesia and while taking pain medication.  It is important to stay ahead for your comfort.  We recommend taking Milk of  Magnesia (2 tablespoons; twice a day) while taking the pain pills.  MEDICATIONS You may be prescribed should start after surgery At your preoperative visit for you history and physical you were given the following medications: Antibiotic: Start this medication when you get home and take according to the instructions on the bottle. Zofran  4 mg:  This is to treat nausea and vomiting.  You can take this every 6 hours as needed and only if needed. Oxycodone  5 mg every 6 hours for 3 - 5 days.  This is to be used after you have taken the Motrin  or the Tylenol . 4.   Gabapentin  300 mg every 12 hours for 7 days.  Over the counter Medication to take: Ibuprofen  (Motrin ) 400 - 600 mg every 6 hour for 7 days Tylenol  500 mg every 6 hours for 7 days.  Only take the Oxycodone  after you have tried these two. MiraLAX  or stool softener of choice: Take this according to the bottle if you take the Norco.  If muscle work done:  Flexeril  5 mg every 12 hours for 7 days.  WHEN TO CALL Call your surgeon's office if any of the following occur: Fever 101 degrees F or greater Excessive bleeding or fluid from the incision site. Pain that increases over time without aid from the medications Redness, warmth, or pus draining from incision sites Persistent nausea or inability to take in liquids Severe misshapen area that underwent the operation.   Plastic surgery website: https://www.plasticsurgery.org/for-medical-professionals/education-and-resources/publications/breast-reconstruction-magazine Breast Reconstruction Awareness Campaign:  chesscontest.fr Plastic surgery Implant information:  https://www.plasticsurgery.org/patient-safety/breast-implant-safety   Post Anesthesia Home Care Instructions  Activity: Get plenty of rest for the remainder of the day. A responsible individual must stay with you for 24 hours following the procedure.  For the next 24 hours, DO NOT: -Drive a car -Social worker -Drink alcoholic beverages -Take any medication unless instructed by your physician -Make any legal decisions or sign important papers.  Meals: Start with liquid foods such as gelatin or soup. Progress to regular foods as tolerated. Avoid greasy, spicy, heavy foods. If nausea and/or vomiting occur, drink only clear liquids until the nausea and/or vomiting subsides. Call your physician if vomiting continues.  Special Instructions/Symptoms: Your throat may feel dry or sore from the anesthesia or the breathing tube placed in your throat during surgery. If this causes discomfort, gargle with warm salt water. The discomfort should disappear within 24 hours.  If you had a scopolamine patch placed behind your ear for the management of post- operative nausea and/or vomiting:  1. The medication in the patch is effective for 72 hours, after which it should be removed.  Wrap patch in a tissue and discard in the trash. Wash hands thoroughly with soap and water. 2. You may remove  the patch earlier than 72 hours if you experience unpleasant side effects which may include dry mouth, dizziness or visual disturbances. 3. Avoid touching the patch. Wash your hands with soap and water after contact with the patch.     Information for Discharge Teaching: EXPAREL  (bupivacaine  liposome injectable suspension)   Pain relief is important to your recovery. The goal is to control your pain so you can move easier and return to your normal activities as soon as possible after your procedure. Your physician may use several types of medicines to manage pain, swelling, and more.  Your surgeon or anesthesiologist gave you EXPAREL (bupivacaine ) to help control your pain after surgery.  EXPAREL  is a local anesthetic designed to release slowly over an extended period of time to provide pain relief by numbing the tissue around the surgical site. EXPAREL  is designed to release pain medication over time and can control  pain for up to 72 hours. Depending on how you respond to EXPAREL , you may require less pain medication during your recovery. EXPAREL  can help reduce or eliminate the need for opioids during the first few days after surgery when pain relief is needed the most. EXPAREL  is not an opioid and is not addictive. It does not cause sleepiness or sedation.   Important! A teal colored band has been placed on your arm with the date, time and amount of EXPAREL  you have received. Please leave this armband in place for the full 96 hours following administration, and then you may remove the band. If you return to the hospital for any reason within 96 hours following the administration of EXPAREL , the armband provides important information that your health care providers to know, and alerts them that you have received this anesthetic.    Possible side effects of EXPAREL : Temporary loss of sensation or ability to move in the area where medication was injected. Nausea, vomiting, constipation Rarely, numbness and tingling in your mouth or lips, lightheadedness, or anxiety may occur. Call your doctor right away if you think you may be experiencing any of these sensations, or if you have other questions regarding possible side effects.  Follow all other discharge instructions given to you by your surgeon or nurse. Eat a healthy diet and drink plenty of water or other fluids. "

## 2024-12-13 NOTE — Op Note (Signed)
 12/13/2024  8:30 AM  9:53 AM  PATIENT:  Molly Hanson  69 y.o. female  PRE-OPERATIVE DIAGNOSIS:  RIGHT BREAST CANCER  POST-OPERATIVE DIAGNOSIS:  RIGHT BREAST CANCER  PROCEDURE:  Procedures: BREAST LUMPECTOMY (Right) BIOPSY, LYMPH NODE, SENTINEL (Right)  SURGEON:  Surgeons and Role: Panel 1:    DEWAINE Curvin Deward DOUGLAS, MD - Primary  PHYSICIAN ASSISTANT:   ASSISTANTS: none   ANESTHESIA:   local and general  EBL:  minimal   BLOOD ADMINISTERED:none  DRAINS: none   LOCAL MEDICATIONS USED:  MARCAINE      SPECIMEN:  Source of Specimen:  right breast tissue with additional medial, lateral, superior, inferior, and deep margins and sentinel node x 4  DISPOSITION OF SPECIMEN:  PATHOLOGY  COUNTS:  YES  TOURNIQUET:  * No tourniquets in log *  DICTATION: .Dragon Dictation  After informed consent was obtained the patient was brought to the operating room and placed in the supine position on the operating table.  After adequate induction of general anesthesia the patient's bilateral chest, breast, and axillary areas were prepped with Betadine and draped in usual sterile manner.  An appropriate timeout was performed.  Earlier in the day the patient underwent injection of 1 mCi of technetium sulfur colloid in the subareolar position on the right.  The patient's cancer was palpable in the lateral right breast.  The plastic surgeon mapped out lines for the reduction.  I made an incision along the lateral line closest to the palpable cancer.  The area around this had been infiltrated with quarter percent Marcaine .  The incision was carried through the skin and subcutaneous tissue sharply with the electrocautery.  Dissection was then carried laterally between the breast tissue and the subcutaneous fat and skin.  Once this dissection was beyond the area of the cancer I then removed a circular portion of breast tissue sharply with the electrocautery around the palpable cancer.  Once the tissue was  removed it was oriented with the appropriate paint colors.  A specimen radiograph was obtained that showed the clip to be near the center of the specimen.  I did elect to take additional margins from all the edges except anterior which was already at the skin.  All of this tissue was then marked appropriately and sent to pathology for further evaluation.  The wound was hemostatic.  The wound was packed with a lap sponge.  At this point the neoprobe was then set to technetium and a good signal was identified in the right axilla.  A small transversely oriented incision was made with a 15 blade knife overlying the area of radioactivity.  The incision was carried through the skin and subcutaneous tissue sharply with the electrocautery until the deep right axillary space was entered.  The neoprobe was used to direct blunt hemostat dissection.  I was able to identify 3 lymph nodes with signal and 1 lymph node that was palpable.  All of these lymph nodes were excised sharply with the electrocautery and the surrounding small vessels and lymphatics were controlled with clips.  Ex vivo counts on these nodes ranged from [phone].  These were sent as sentinel nodes numbers 1 through 4.  No other hot or palpable nodes were identified in the right axilla.  Hemostasis was achieved using the Bovie electrocautery.  The deep layer of the axilla was closed with interrupted 3-0 Vicryl stitches.  The skin was closed with a running 4-0 Monocryl subcuticular stitch.  The patient tolerated the procedure well.  At this point the operation was turned over to Dr. Lowery for the reduction.  Her portion will be dictated separately.  All needle sponge and instrument counts were correct.  PLAN OF CARE: Admit for overnight observation  PATIENT DISPOSITION:  PACU - hemodynamically stable.   Delay start of Pharmacological VTE agent (>24hrs) due to surgical blood loss or risk of bleeding: not applicable

## 2024-12-13 NOTE — Anesthesia Postprocedure Evaluation (Signed)
"   Anesthesia Post Note  Patient: LYNDEL DANCEL  Procedure(s) Performed: BREAST LUMPECTOMY (Right: Breast) BIOPSY, LYMPH NODE, SENTINEL (Right: Axilla) MAMMOPLASTY, REDUCTION (Bilateral: Breast)     Patient location during evaluation: PACU Anesthesia Type: General Level of consciousness: awake and alert and oriented Pain management: pain level controlled Vital Signs Assessment: post-procedure vital signs reviewed and stable Respiratory status: spontaneous breathing, nonlabored ventilation and respiratory function stable Cardiovascular status: blood pressure returned to baseline and stable Postop Assessment: no apparent nausea or vomiting Anesthetic complications: no   No notable events documented.  Last Vitals:  Vitals:   12/13/24 1221 12/13/24 1232  BP:  137/75  Pulse: 79 95  Resp: (!) 9 15  Temp:    SpO2: 99% 98%    Last Pain:  Vitals:   12/13/24 1232  TempSrc:   PainSc: 4                  Adya Wirz A.      "

## 2024-12-14 ENCOUNTER — Telehealth: Payer: Self-pay | Admitting: Hematology and Oncology

## 2024-12-14 ENCOUNTER — Encounter (HOSPITAL_BASED_OUTPATIENT_CLINIC_OR_DEPARTMENT_OTHER): Payer: Self-pay | Admitting: General Surgery

## 2024-12-14 NOTE — Telephone Encounter (Signed)
 I left voicemail for patient regarding 01/10/2025 MD appointment per staff message.

## 2024-12-14 NOTE — Discharge Summary (Signed)
 Physician Discharge Summary  Patient ID: Molly Hanson MRN: [id number] DOB/AGE: 03-18-1956 69 y.o.  Admit date: 12/13/2024 Discharge date: 12/14/2024  Admission Diagnoses:  Discharge Diagnoses:  Principal Problem:   Breast cancer [facility])   Discharged Condition: good  Hospital Course: The patient was taken to the operating room and underwent bilateral oncoplastic breast reduction and right partial mastectomy.  The patient did well for surgery.  She felt comfortable going home.   Consults: None  Significant Diagnostic Studies: none  Treatments: surgery: Breast reduction  Discharge Exam: Blood pressure (!) 143/86, pulse 86, temperature (!) 97.1 F (36.2 C), resp. rate 16, height 5' 5 (1.651 m), weight 107.5 kg, SpO2 93%. General appearance: alert, cooperative, and appears stated age Head: Normocephalic, without obvious abnormality, atraumatic Incision/Wound:  Disposition: Discharge disposition: 01-Home or Self Care        Allergies as of 12/13/2024   No Known Allergies      Medication List     TAKE these medications    albuterol  108 (90 Base) MCG/ACT inhaler Commonly known as: VENTOLIN  HFA Inhale 2 puffs into the lungs every 6 (six) hours as needed for wheezing or shortness of breath.   ALPRAZolam  0.5 MG tablet Commonly known as: XANAX  Take 1 tablet (0.5 mg total) by mouth at bedtime as needed for sleep (premedication for the sleep test.).   griseofulvin 125 MG tablet Commonly known as: GRIS-PEG Take 125 mg by mouth 2 (two) times daily.   ketoconazole 2 % cream Commonly known as: NIZORAL Apply topically as needed.   MULTIVITAMIN PO Take by mouth daily.   ondansetron  4 MG tablet Commonly known as: ZOFRAN  Take 1 tablet (4 mg total) by mouth every 8 (eight) hours as needed for up to 15 doses for nausea or vomiting.   polyethylene glycol powder 17 GM/SCOOP powder Commonly known as: GLYCOLAX /MIRALAX  Take 17 g by mouth 2 (two) times daily as needed.  Dissolve 1 capful (17g) in 4-8 ounces of liquid and take by mouth daily.   rosuvastatin  10 MG tablet Commonly known as: Crestor  Take 1 tablet (10 mg total) by mouth daily.   tirzepatide  2.5 MG/0.5ML Pen Commonly known as: MOUNJARO  Inject 2.5 mg into the skin once a week.   traMADol  50 MG tablet Commonly known as: Ultram  Take 1 tablet (50 mg total) by mouth every 8 (eight) hours as needed for up to 15 doses for moderate pain (pain score 4-6) or severe pain (pain score 7-10).   valACYclovir  1000 MG tablet Commonly known as: Valtrex  Take 1 tablet (1,000 mg total) by mouth 2 (two) times daily.   vitamin B-12 50 MCG tablet Commonly known as: CYANOCOBALAMIN  Take 50 mcg by mouth daily.   VITAMIN D  PO Take by mouth.        Follow-up Information     Krishawna Stiefel, Estefana RAMAN, DO Follow up in 10 day(s).   Specialty: Plastic Surgery Contact information: [address] [place] [address] [phone]                 Signed: Estefana RAMAN Fritter 12/14/2024, 12:13 PM

## 2024-12-15 ENCOUNTER — Ambulatory Visit: Payer: Self-pay | Admitting: Neurology

## 2024-12-15 ENCOUNTER — Telehealth: Payer: Self-pay | Admitting: Plastic Surgery

## 2024-12-15 DIAGNOSIS — R7689 Other specified abnormal immunological findings in serum: Secondary | ICD-10-CM | POA: Insufficient documentation

## 2024-12-15 DIAGNOSIS — G4733 Obstructive sleep apnea (adult) (pediatric): Secondary | ICD-10-CM | POA: Insufficient documentation

## 2024-12-15 LAB — SURGICAL PATHOLOGY

## 2024-12-15 NOTE — Telephone Encounter (Signed)
 Pt called wanting to know when she would be able to take a shower, also wanting to know when she would be able to put her new bra on that she got from [employer]? When does she need to start doing her arm exercises ? Pt is having pain on left side but is taking tylenol .  Please advise.

## 2024-12-15 NOTE — Telephone Encounter (Signed)
 I called the patient and answered all of her questions to her satisfaction

## 2024-12-15 NOTE — Procedures (Signed)
 "  [facility] POLYSOMNOGRAPHY  INTERPRETATION REPORT   STUDY DATE:  12/06/2024     PATIENT NAME:  Molly Hanson         DATE OF BIRTH:  1956-09-29  PATIENT ID:  [id number]    TYPE OF STUDY:  PSG   PHYSICIAN: DEDRA GORES, MD REFERRED BY: Heilingoetter, Molly  SCORING TECHNICIAN: Delon Sprung, RPSGT   HISTORY:  This 69 year-old Female patient was seen on 08-22-24 upon referral by Molly Hanson .  Molly Hanson is presenting with excessive daytime sleepiness, is also recently followed by rheumatology for positive ANA and is a breast cancer survivor. She reports daytime sleepiness , not as much fatigue, she reports chronic pain, shooting pain into the left leg ( Dr Tobie, DO at [facility])  and most recent abdominal pain. Nocturia up to 6 times at night .Back pain- radiculopathic.  She has an abnormal ANA test, high ferritin and high Cr protein. Autoimmune diseases often cause fatigue and diffuse pain.  Risk factors for OSA were present, including : Body mass index is 36.96 kg/m., neck size and upper airway anatomy. She snores.  ? ADDITIONAL INFORMATION:  The Epworth Sleepiness Scale was endorsed at 19 /24 points (scores above or equal to 10 are suggestive of hypersomnolence). FSS endorsed at 32  /63 points.  Height: 66 in Weight: 229 lb (BMI 36) Neck Size: 16 in Mallampati  3 plus (4)  MEDICATIONS: Albuterol  Sulfate 108 (90 Base) MCG/ACT 2 puffs Inhalation Every 6 hours PRN ALPRAZolam  0.5 mg Oral At bedtime PRN Atorvastatin  Calcium  20 mg Oral Daily Clotrimazole -Betamethasone  1-0.05 % 1 Application Topical Daily Meclizine  HCl 12.5 mg Oral 3 times daily PRN Patient not taking: Reported on 08/22/2024 Polyethylene Glycol 3350  17 g Oral 3 times daily, One scoop in beverage of your choice with each meal. You may increase if you continue to be constipated and decrease if your stool becomes too loose. prednisoLONE Acetate 1 % 1 drop 2 times daily Simethicone  80 mg Oral  Every 6 hours PRN  TECHNICAL DESCRIPTION: A registered sleep technologist ( RPSGT)  was in attendance for the duration of the recording.  Data collection, scoring, video monitoring, and reporting were performed in compliance with the AASM Manual for the Scoring of Sleep and Associated Events; (Hypopnea is scored based on the criteria listed in Section VIII D. 1b in the AASM Manual V2.6 using a 4% oxygen desaturation rule or Hypopnea is scored based on the criteria listed in Section VIII D. 1a in the AASM Manual V2.6 using 3% oxygen desaturation and /or arousal rule).   SLEEP CONTINUITY AND SLEEP ARCHITECTURE:  Lights-out was at 21:45: and lights-on at  05:22:, with  7.6 hours of recording time . Total sleep time ( TST) was 355.0 minutes with a decreased sleep efficiency at 77.6%.   Sleep latency was decreased at 4.5 minutes.  REM sleep latency was short at 39.5 minutes.  Of the total sleep time, the percentage of stage N1 sleep was 4.9%, stage N2 sleep was 75%, stage N3 sleep was 0.1%, and REM sleep was 19.7%.  There were 5 Stage R periods observed on this study night, 31 awakenings (i.e. transitions to Stage W from any sleep stage), and 99 total stage transitions. Wake after sleep onset (WASO) time accounted for 97.5 minutes.  BODY POSITION:  TST was divided  between the following sleep positions as follows: supine 140 minutes (40%), non-supine 215 minutes (60%); right 214 minutes (60%), left 00  minutes (0%), and prone 00 minutes (0%). Total supine REM sleep time was 20 minutes (29% of total REM sleep).  RESPIRATORY MONITORING:  Based on CMS criteria (using a 4% oxygen desaturation rule for scoring hypopneas), there were 4 apneas (3 obstructive; 1 central; 0 mixed), and 87 hypopneas.  Apnea index was 0.7. Hypopnea index was 14.7. The AHI ( apnea-hypopnea index)  was 15.4/h  overall (14.5/h in  supine, 16.2/h in non-supine; 40.3/h in REM, 9.5/h in NREM.  There were 0 respiratory effort-related arousals  (RERAs).   Based on AASM criteria (using a 3% oxygen desaturation and /or arousal rule for scoring hypopneas), there were 4 apneas (3 obstructive; 1 central; 0 mixed), and 88 hypopneas. Apnea index was 0.7. Hypopnea index was 14.9. The AHI ( apnea-hypopnea index)  was 15.5/h  overall (14.5/h in  supine, 16.2/h in non-supine; 40.3/h in REM, 9.5/h in NREM).  OXIMETRY: Oxyhemoglobin Saturation Nadir during sleep was at 75% from a mean of 92%. Total sleep time (TST) in hypoxemia (<88%) was 50.0 minutes, or 14.1% of total sleep time.  LIMB MOVEMENTS: There were 0 periodic limb movements of sleep (0.0/hr), of which 0 (0.0/hr) were associated with an arousal. AROUSAL: There were 90 arousals in total.  Of these, 22 were identified as respiratory-related arousals (4 /h), 0 were PLM-related arousals (0 /h), and 78 were non-specific arousals (13 /h). EEG:  PSG EEG was of normal amplitude and frequency, with symmetric manifestation of sleep stages. EKG: The electrocardiogram documented  NSR.  The average heart rate during sleep was 88 bpm.  The heart rate during sleep varied between a minimum of 73 bpm and  a maximum of  112 bpm. AUDIO and VIDEO:  Two bathroom visits, loud snoring.   IMPRESSION:  Moderate Obstructive Sleep apnea was associated with loud snoring and severe oxygen desaturation. The AHI ( apnea-hypopnea index)  was 15.5/h  overall and apnea was clearly REM sleep dependent , (AHI 40.3/h in REM) , not positional dependent. REM sleep was associated with oxygen desaturation. Hypoxia was present for 14-15% of total sleep time.   Total sleep time was reduced at 355.0 minutes.  Latency to sleep and to REM sleep was significantly decreased; this may be consistent with a variety of conditions including sleep deprivation and discontinuation of REM-suppressing medication.   RECOMMENDATIONS: Molly Hanson will need positive airway pressure therapy for this form and constellation of apnea.  Her sleep disorder is  hypopnea based, predominantly during REM sleep , causing the elevated AHI and hypoxia. My recommendation is to start on an an auto-titration device by [employer] - CPAP with pressure settings from 6 through 16 cm water, 2 cm EPR and a mask that addresses snoring as well. If  the patient is claustrophobic, please provide a chinstrap and nasal interface as an alternative . Inform the patient that compliance is defined as 4 hours or more of daily CPAP use.  Moderate OSA in a patient with obesity is an FDA approved use of Zepbound  but insurance may still not cover it.  Weight loss remains one of the most important apnea treatments as it addresses a common risk factor - obesity.   RV with NP or me in the sleep clinic at [facility] between day 30 and 90 of CPAP use.  DEDRA GORES,  MD           "

## 2024-12-19 ENCOUNTER — Telehealth: Payer: Self-pay | Admitting: *Deleted

## 2024-12-19 ENCOUNTER — Encounter: Payer: Self-pay | Admitting: *Deleted

## 2024-12-19 NOTE — Telephone Encounter (Signed)
 Ordered oncotype per Dr. Loretha. Requisition sent to [facility] [employer]

## 2024-12-20 ENCOUNTER — Encounter: Admitting: Student

## 2024-12-20 VITALS — BP 137/79 | HR 74

## 2024-12-20 DIAGNOSIS — C50411 Malignant neoplasm of upper-outer quadrant of right female breast: Secondary | ICD-10-CM

## 2024-12-20 NOTE — Progress Notes (Signed)
 Patient is a 69 year old female who recently underwent right breast lumpectomy with sentinel lymph node biopsy with Dr. Curvin followed by bilateral breast reduction with Dr. Lowery on 12/13/2024.  Patient is 1 week out from her procedure.  She presents to the clinic today for postoperative follow-up.  Today, patient reports she is doing well.  She states that she noticed a little bit of redness to her right breast towards the bottom.  She denies any fevers or chills.  She reports that she has been up and ambulating without difficulty.  She states that she has been eating and drinking without issue.  She states that she has been having bowel movements.  She reports that her pain has overall been controlled.  Denies any other issues or concerns at this time.  Chaperone present on exam.  On exam, patient is sitting upright in no acute distress.  Breasts are overall soft and symmetric.  There is no overlying erythema to either breast.  There is some bruising noted to the right inferior/lateral breast, suspect that this is from liposuction or lumpectomy.  There is no significant tenderness to palpation.  No cellulitic changes noted to the skin.  There is no obvious fluid collection noted on exam.  NAC's appear to be healthy.  Steri-Strips are in place over the incisions.  There are no signs of infection on exam.  Discussed with the patient that she appears to have some bruising to the right inferior/lateral breast.  Discussed with her that this should resolve over the next few weeks.  Recommended that she continue with lipo foam to the sides.  Patient expressed understanding.  Discussed with patient to continue with compression at all times and avoid strenuous activities.  Patient to follow back up at her next scheduled appointment.  I instructed her to call in the meantime with any questions or concerns about anything.

## 2024-12-22 ENCOUNTER — Ambulatory Visit: Payer: Self-pay | Admitting: General Surgery

## 2024-12-27 NOTE — Telephone Encounter (Signed)
 Yuliza Cara D, CMA  Joylene, Bradley; Ziegler, Melissa; Tucker, Dolanda; Cain, Wheeler New orders have been placed for the above pt, DOB: [birth year] Thanks

## 2024-12-28 ENCOUNTER — Encounter (HOSPITAL_COMMUNITY): Payer: Self-pay

## 2024-12-28 ENCOUNTER — Encounter: Payer: Self-pay | Admitting: *Deleted

## 2024-12-28 NOTE — Progress Notes (Signed)
 Oncotype resutls 15 / 5%  Patient is seeing Dr. Jomarie for Rad Onc consult 2/11  F/u appt with Dr. Loretha 2/18

## 2024-12-29 ENCOUNTER — Encounter: Payer: Self-pay | Admitting: *Deleted

## 2024-12-29 ENCOUNTER — Other Ambulatory Visit: Payer: Self-pay | Admitting: Surgery

## 2024-12-29 DIAGNOSIS — R2231 Localized swelling, mass and lump, right upper limb: Secondary | ICD-10-CM

## 2024-12-29 NOTE — Progress Notes (Signed)
 Patient called navigator and stated her surgical arm is a little sore and swollen. She stated she has been doing her exercises and didn't know if it was from that or if something was wrong. Gave her number of [facility] and sent msg to Dr. Curvin and nurse she will be calling. No other needs per patient at this time.

## 2025-01-02 ENCOUNTER — Encounter: Admitting: Plastic Surgery

## 2025-01-02 ENCOUNTER — Encounter

## 2025-01-02 ENCOUNTER — Other Ambulatory Visit

## 2025-01-03 ENCOUNTER — Ambulatory Visit: Admitting: Radiation Oncology

## 2025-01-03 ENCOUNTER — Ambulatory Visit

## 2025-01-10 ENCOUNTER — Inpatient Hospital Stay: Attending: Hematology and Oncology | Admitting: Hematology and Oncology

## 2025-01-19 ENCOUNTER — Encounter: Admitting: Student

## 2025-01-22 ENCOUNTER — Ambulatory Visit

## 2025-04-19 ENCOUNTER — Ambulatory Visit: Admitting: Dermatology

## 2025-05-11 ENCOUNTER — Encounter: Admitting: Family

## 2025-05-15 ENCOUNTER — Ambulatory Visit
